# Patient Record
Sex: Female | Born: 1965 | Race: White | Hispanic: No | Marital: Married | Smoking: Never smoker
Health system: Southern US, Community
[De-identification: ages and names within clinical notes are randomized; demographics above are authoritative.]

## PROBLEM LIST (undated history)

## (undated) DIAGNOSIS — E669 Obesity, unspecified: Secondary | ICD-10-CM

## (undated) DIAGNOSIS — I10 Essential (primary) hypertension: Secondary | ICD-10-CM

## (undated) DIAGNOSIS — R7303 Prediabetes: Secondary | ICD-10-CM

## (undated) DIAGNOSIS — E7800 Pure hypercholesterolemia, unspecified: Secondary | ICD-10-CM

## (undated) HISTORY — DX: Pure hypercholesterolemia, unspecified: E78.00

## (undated) HISTORY — DX: Obesity, unspecified: E66.9

## (undated) HISTORY — PX: MOHS SURGERY: SUR867

## (undated) HISTORY — DX: Prediabetes: R73.03

## (undated) HISTORY — DX: Essential (primary) hypertension: I10

---

## 2013-06-04 ENCOUNTER — Ambulatory Visit: Payer: Self-pay | Admitting: Family Medicine

## 2013-06-04 LAB — LIPID PANEL
Cholesterol: 199 mg/dL (ref 0–200)
HDL: 60 mg/dL (ref 35–70)
LDL Cholesterol: 119 mg/dL
LDl/HDL Ratio: 2
TRIGLYCERIDES: 101 mg/dL (ref 40–160)

## 2013-06-04 LAB — BASIC METABOLIC PANEL
BUN: 18 mg/dL (ref 4–21)
Creatinine: 0.8 mg/dL (ref <=1.1)
Glucose: 96 mg/dL
Potassium: 3.9 mmol/L (ref 3.4–5.3)
SODIUM: 139 mmol/L (ref 137–147)

## 2013-06-04 LAB — HEPATIC FUNCTION PANEL
ALT: 17 U/L (ref 7–35)
AST: 14 U/L (ref 13–35)
Alkaline Phosphatase: 75 U/L (ref 25–125)
Bilirubin, Total: 0.7 mg/dL

## 2013-06-04 LAB — CBC AND DIFFERENTIAL
HEMATOCRIT: 40 % (ref 36–46)
Hemoglobin: 13.7 g/dL (ref 12.0–16.0)
Neutrophils Absolute: 71 /uL
Platelets: 319 10*3/uL (ref 150–399)
WBC: 10.1 10*3/mL

## 2013-06-04 LAB — TSH: TSH: 2.23 u[IU]/mL (ref <=5.90)

## 2014-08-10 ENCOUNTER — Other Ambulatory Visit: Payer: Self-pay

## 2014-08-10 NOTE — Telephone Encounter (Signed)
Fax from pharmacy-[facility]OV was 02/2014

## 2014-08-11 MED ORDER — HYDROCHLOROTHIAZIDE 25 MG PO TABS: 25.0000 mg | ORAL_TABLET | Freq: Every day | ORAL | Status: DC

## 2014-08-28 DIAGNOSIS — I10 Essential (primary) hypertension: Secondary | ICD-10-CM | POA: Insufficient documentation

## 2014-08-28 DIAGNOSIS — G4730 Sleep apnea, unspecified: Secondary | ICD-10-CM | POA: Insufficient documentation

## 2014-08-28 DIAGNOSIS — K219 Gastro-esophageal reflux disease without esophagitis: Secondary | ICD-10-CM | POA: Insufficient documentation

## 2014-08-28 DIAGNOSIS — E669 Obesity, unspecified: Secondary | ICD-10-CM | POA: Insufficient documentation

## 2014-09-20 ENCOUNTER — Encounter: Payer: Self-pay | Admitting: Family Medicine

## 2014-09-20 ENCOUNTER — Ambulatory Visit (INDEPENDENT_AMBULATORY_CARE_PROVIDER_SITE_OTHER): Payer: 59 | Admitting: Family Medicine

## 2014-09-20 VITALS — BP 122/80 | HR 72 | Temp 98.0°F | Resp 16 | Ht 65.0 in | Wt 231.0 lb

## 2014-09-20 DIAGNOSIS — K219 Gastro-esophageal reflux disease without esophagitis: Secondary | ICD-10-CM

## 2014-09-20 DIAGNOSIS — Z0000 Encounter for general adult medical examination without abnormal findings: Secondary | ICD-10-CM | POA: Diagnosis not present

## 2014-09-20 DIAGNOSIS — E669 Obesity, unspecified: Secondary | ICD-10-CM | POA: Diagnosis not present

## 2014-09-20 DIAGNOSIS — Z23 Encounter for immunization: Secondary | ICD-10-CM

## 2014-09-20 DIAGNOSIS — Z01419 Encounter for gynecological examination (general) (routine) without abnormal findings: Secondary | ICD-10-CM | POA: Diagnosis not present

## 2014-09-20 MED ORDER — OMEPRAZOLE 20 MG PO CPDR: 20.0000 mg | DELAYED_RELEASE_CAPSULE | Freq: Every day | ORAL | Status: DC

## 2014-09-20 NOTE — Progress Notes (Signed)
Patient ID: Mackenzie Newman, female   DOB: April 11, 1965, 49 y.o.   MRN: [id number]       [name], Female    DOB: 05-31-1965, 49 y.o.   MRN: [id number] Visit Date: 09/20/2014  Today's Provider: Megan Mans, MD   Chief Complaint  Patient presents with  . Annual Exam   Subjective:    Annual physical exam Mackenzie Newman is a 49 y.o. female who presents today for health maintenance and complete physical. She feels well. She reports exercising, walking about 3 days a week. She reports she is sleeping well. She reports that she would like a referral to a OB, as she would rather have her female exams done there and by a female.  ----------------------------------------------------------------- Pt reports that she has a cough that has been going on for the last couple of weeks. He describes it a " sealy" cough. It is mainly at her job, so she is unsure if it is because of the air conditioning or something there.  Review of Systems  Constitutional: Negative.   HENT: Negative.   Eyes: Negative.   Respiratory: Positive for cough.   Cardiovascular: Negative.   Gastrointestinal: Negative.   Endocrine: Negative.   Genitourinary: Negative.   Musculoskeletal: Negative.   Skin: Negative.   Allergic/Immunologic: Negative.   Neurological: Negative.   Hematological: Negative.   Psychiatric/Behavioral: Negative.    mild heartburn/reflux  with tomato products--this improved by taking one of her husband's omeprazole.  Social History She  reports that she has never smoked. She does not have any smokeless tobacco history on file. She reports that she does not drink alcohol or use illicit drugs.  Patient Active Problem List   Diagnosis Date Noted  . Acid reflux 08/28/2014  . Essential (primary) hypertension 08/28/2014  . Adiposity 08/28/2014  . Apnea, sleep 08/28/2014    Past Surgical History  Procedure Laterality Date  . Cesarean section      Family History Her She  was adopted. Family history is unknown by patient.    No Known Allergies  Previous Medications   HYDROCHLOROTHIAZIDE (HYDRODIURIL) 25 MG TABLET    Take 1 tablet (25 mg total) by mouth daily.    Patient Care Team: Maple Hudson., MD as PCP - General (Family Medicine)     Objective:   Vitals: BP 122/80 mmHg  Pulse 72  Temp(Src) 98 F (36.7 C) (Oral)  Resp 16  Ht  (1.651 m)  Wt 231 lb (104.781 kg)  BMI 38.44 kg/m2   Physical Exam  Constitutional: She is oriented to person, place, and time. She appears well-developed and well-nourished.  Waist measured at 41 inches  HENT:  Head: Normocephalic and atraumatic.  Right Ear: External ear normal.  Left Ear: External ear normal.  Nose: Nose normal.  Mouth/Throat: Oropharynx is clear and moist.  Eyes: Conjunctivae and EOM are normal. Pupils are equal, round, and reactive to light.  Neck: Normal range of motion. Neck supple.  Cardiovascular: Normal rate, regular rhythm, normal heart sounds and intact distal pulses.   Pulmonary/Chest: Effort normal and breath sounds normal.  Abdominal: Soft. Bowel sounds are normal.  Genitourinary:  deferred to GYN, referral made.  Musculoskeletal: Normal range of motion.  Neurological: She is alert and oriented to person, place, and time. She has normal reflexes.  Skin: Skin is warm and dry.  Very fair skin. No lesions noted.  Psychiatric: She has a normal mood and affect. Her behavior is normal. Judgment and thought content  normal.     Depression Screen No flowsheet data found.    Assessment & Plan:     Routine Health Maintenance and Physical Exam --Refer to GYN for gynecologic care --Mammogram every year to 2 years --Colonoscopy at age 49 Exercise Activities and Dietary recommendations Goals    None       There is no immunization history on file for this patient.  Health Maintenance  Topic Date Due  . HIV Screening  01/01/1982  . PAP SMEAR  01/01/1985  .  TETANUS/TDAP  01/01/1986  . INFLUENZA VACCINE  10/04/2014  1. Annual physical exam  - CBC with Differential/Platelet - COMPLETE METABOLIC PANEL WITH GFR - Lipid Panel With LDL/HDL Ratio - TSH  2. Obesity BMI is 38.44. Pt working on diet and exercise to get her BMI down so her insurance will be cheaper. Goal of 29. Waist measured at 41 inches instructed pt that her goal is 35.  Patient is shaped as opposed to apple shape so I think her BMI is not an accurate measure of her what obesity. I think a waist size of 35 or 34 will still leave her with a BMI of 30. I will be pleased if she can get to this level. He is working hard on this.   3. Visit for routine gyn exam  - Ambulatory referral to Gynecology  4. Need for Tdap vaccination  - Tdap vaccine greater than or equal to 7yo IM  5. Gastroesophageal reflux disease, esophagitis presence not specified  - omeprazole (PRILOSEC) 20 MG capsule; Take 1 capsule (20 mg total) by mouth daily.  Dispense: 90 capsule; Refill: 3     Discussed health benefits of physical activity, and encouraged her to engage in regular exercise appropriate for her age and condition.    --------------------------------------------------------------------

## 2014-09-24 LAB — CBC WITH DIFFERENTIAL/PLATELET
Basophils Absolute: 0 10*3/uL (ref 0.0–0.2)
Basos: 0 %
EOS (ABSOLUTE): 0.1 10*3/uL (ref 0.0–0.4)
EOS: 2 %
HEMOGLOBIN: 13.7 g/dL (ref 11.1–15.9)
Hematocrit: 40 % (ref 34.0–46.6)
IMMATURE GRANULOCYTES: 0 %
Immature Grans (Abs): 0 10*3/uL (ref 0.0–0.1)
Lymphocytes Absolute: 2.5 10*3/uL (ref 0.7–3.1)
Lymphs: 26 %
MCH: 28.4 pg (ref 26.6–33.0)
MCHC: 34.3 g/dL (ref 31.5–35.7)
MCV: 83 fL (ref 79–97)
Monocytes Absolute: 0.6 10*3/uL (ref 0.1–0.9)
Monocytes: 6 %
Neutrophils Absolute: 6.2 10*3/uL (ref 1.4–7.0)
Neutrophils: 66 %
Platelets: 331 10*3/uL (ref 150–379)
RBC: 4.82 x10E6/uL (ref 3.77–5.28)
RDW: 13 % (ref 12.3–15.4)
WBC: 9.5 10*3/uL (ref 3.4–10.8)

## 2014-09-25 LAB — COMPREHENSIVE METABOLIC PANEL
ALBUMIN: 4.1 g/dL (ref 3.5–5.5)
ALT: 18 IU/L (ref 0–32)
AST: 15 IU/L (ref 0–40)
Albumin/Globulin Ratio: 1.3 (ref 1.1–2.5)
Alkaline Phosphatase: 75 IU/L (ref 39–117)
BUN/Creatinine Ratio: 15 (ref 9–23)
BUN: 15 mg/dL (ref 6–24)
Bilirubin Total: 0.5 mg/dL (ref 0.0–1.2)
CHLORIDE: 98 mmol/L (ref 97–108)
CO2: 24 mmol/L (ref 18–29)
CREATININE: 1.03 mg/dL — AB (ref 0.57–1.00)
Calcium: 9.5 mg/dL (ref 8.7–10.2)
GFR calc Af Amer: 75 mL/min/{1.73_m2} (ref >59)
GFR calc non Af Amer: 65 mL/min/{1.73_m2} (ref >59)
GLOBULIN, TOTAL: 3.2 g/dL (ref 1.5–4.5)
GLUCOSE: 96 mg/dL (ref 65–99)
Potassium: 3.8 mmol/L (ref 3.5–5.2)
Sodium: 139 mmol/L (ref 134–144)
Total Protein: 7.3 g/dL (ref 6.0–8.5)

## 2014-09-25 LAB — LIPID PANEL WITH LDL/HDL RATIO
CHOLESTEROL TOTAL: 215 mg/dL — AB (ref 100–199)
HDL: 64 mg/dL (ref >39)
LDL CALC: 134 mg/dL — AB (ref 0–99)
LDL/HDL RATIO: 2.1 ratio (ref 0.0–3.2)
Triglycerides: 87 mg/dL (ref 0–149)
VLDL Cholesterol Cal: 17 mg/dL (ref 5–40)

## 2014-09-25 LAB — TSH: TSH: 2.38 u[IU]/mL (ref 0.450–4.500)

## 2014-09-27 NOTE — Progress Notes (Signed)
LMTCB  aa 

## 2014-09-29 ENCOUNTER — Other Ambulatory Visit: Payer: Self-pay | Admitting: Family Medicine

## 2014-10-27 ENCOUNTER — Encounter: Payer: 59 | Admitting: Obstetrics and Gynecology

## 2014-11-10 ENCOUNTER — Ambulatory Visit (INDEPENDENT_AMBULATORY_CARE_PROVIDER_SITE_OTHER): Payer: 59 | Admitting: Obstetrics and Gynecology

## 2014-11-10 ENCOUNTER — Encounter: Payer: Self-pay | Admitting: Obstetrics and Gynecology

## 2014-11-10 VITALS — BP 140/78 | HR 91 | Ht 65.0 in | Wt 233.2 lb

## 2014-11-10 DIAGNOSIS — Z01419 Encounter for gynecological examination (general) (routine) without abnormal findings: Secondary | ICD-10-CM

## 2014-11-11 NOTE — Progress Notes (Signed)
Subjective:    Mackenzie Newman is a 49 y.o. G36P1001 female who presents for an annual exam. The patient has no complaints today. The patient is sexually active. Denies painful intercourse or sexual problems. GYN screening history: last pap: approximate date 2013 and was normal and last mammogram: approximate date 2013 and was normal (performed in [place]). The patient wears seatbelts: yes. The patient participates in regular exercise: no. Has the patient ever been transfused or tattooed?: no. The patient reports that there is not domestic violence in her life.   Menstrual History: OB History    Gravida Para Term Preterm AB TAB SAB Ectopic Multiple Living   Menarche age: 60 Patient's last menstrual period was 10/20/2014.  Notes regular occuring menses, normal flow, denies dysmenorrhea.  Denies h/o abnormal pap smears or STIs.     Past Medical History  Diagnosis Date  . Obesity     Past Surgical History  Procedure Laterality Date  . Cesarean section     Family History  Problem Relation Age of Onset  . Adopted: Yes  . Family history unknown: Yes    Social History   Social History  . Marital Status: Married    Spouse Name: N/A  . Number of Children: N/A  . Years of Education: N/A   Occupational History  . Not on file.   Social History Main Topics  . Smoking status: Never Smoker   . Smokeless tobacco: Not on file  . Alcohol Use: No  . Drug Use: No  . Sexual Activity: Yes   Other Topics Concern  . Not on file   Social History Narrative    Current Outpatient Prescriptions on File Prior to Visit  Medication Sig Dispense Refill  . hydrochlorothiazide (HYDRODIURIL) 25 MG tablet Take 1 tablet by mouth  daily 90 tablet 3  . omeprazole (PRILOSEC) 20 MG capsule Take 1 capsule (20 mg total) by mouth daily. 90 capsule 3    No Known Allergies   Review of Systems Constitutional: negative for chills, fatigue, fevers and sweats Eyes: negative for  irritation, redness and visual disturbance Ears, nose, mouth, throat, and face: negative for hearing loss, nasal congestion, snoring and tinnitus Respiratory: negative for asthma, cough, sputum Cardiovascular: negative for chest pain, dyspnea, exertional chest pressure/discomfort, irregular heart beat, palpitations and syncope Gastrointestinal: negative for abdominal pain, change in bowel habits, nausea and vomiting Genitourinary: negative for abnormal menstrual periods, genital lesions, sexual problems and vaginal discharge, dysuria and urinary incontinence Integument/breast: negative for breast lump, breast tenderness and nipple discharge Hematologic/lymphatic: negative for bleeding and easy bruising Musculoskeletal:negative for back pain and muscle weakness Neurological: negative for dizziness, headaches, vertigo and weakness Endocrine: negative for diabetic symptoms including polydipsia, polyuria and skin dryness Allergic/Immunologic: negative for hay fever and urticaria     Objective:    BP 140/78 mmHg  Pulse 91  Ht  (1.651 m)  Wt 233 lb 3.2 oz (105.779 kg)  BMI 38.81 kg/m2  LMP 10/20/2014  General Appearance:    Alert, cooperative, no distress, appears stated age  Head:    Normocephalic, without obvious abnormality, atraumatic  Eyes:    PERRL, conjunctiva/corneas clear, EOM's intact, both eyes  Ears:    Normal TM's and external ear canals, both ears  Nose:   Nares normal, septum midline, mucosa normal, no drainage or sinus tenderness  Throat:   Lips, mucosa, and tongue normal; teeth and gums normal  Neck:   Supple, symmetrical, trachea midline, no adenopathy;    thyroid:  no enlargement/tenderness/nodules; no carotid bruit or JVD  Back:     Symmetric, no curvature, ROM normal, no CVA tenderness  Lungs:     Clear to auscultation bilaterally, respirations unlabored  Chest Wall:    No tenderness or deformity   Heart:    Regular rate and rhythm, S1 and S2 normal, no murmur,  rub or gallop  Breast Exam:    No tenderness, masses, or nipple abnormality  Abdomen:     Soft, non-tender, bowel sounds active all four quadrants,    no masses, no organomegaly  Genitalia:    Normal female without lesion, discharge or tenderness  Rectal:    Normal external sphincter, no masses or tenderness;   Internal exam not performed  Extremities:   Extremities normal, atraumatic, no cyanosis or edema  Pulses:   2+ and symmetric all extremities  Skin:   Skin color, texture, turgor normal, no rashes or lesions  Lymph nodes:   Cervical, supraclavicular, and axillary nodes normal  Neurologic:   CNII-XII intact, normal strength, sensation and reflexes throughout     Labs:  Office Visit on 09/20/2014  Component Date Value Ref Range Status  . WBC 09/24/2014 9.5  3.4 - 10.8 x10E3/uL Final  . RBC 09/24/2014 4.82  3.77 - 5.28 x10E6/uL Final  . Hemoglobin 09/24/2014 13.7  11.1 - 15.9 g/dL Final  . Hematocrit 21/30/8657 40.0  34.0 - 46.6 % Final  . MCV 09/24/2014 83  79 - 97 fL Final  . MCH 09/24/2014 28.4  26.6 - 33.0 pg Final  . MCHC 09/24/2014 34.3  31.5 - 35.7 g/dL Final  . RDW 84/69/6295 13.0  12.3 - 15.4 % Final  . Platelets 09/24/2014 331  150 - 379 x10E3/uL Final  . Neutrophils 09/24/2014 66   Final  . Lymphs 09/24/2014 26   Final  . Monocytes 09/24/2014 6   Final  . Eos 09/24/2014 2   Final  . Basos 09/24/2014 0   Final  . Neutrophils Absolute 09/24/2014 6.2  1.4 - 7.0 x10E3/uL Final  . Lymphocytes Absolute 09/24/2014 2.5  0.7 - 3.1 x10E3/uL Final  . Monocytes Absolute 09/24/2014 0.6  0.1 - 0.9 x10E3/uL Final  . EOS (ABSOLUTE) 09/24/2014 0.1  0.0 - 0.4 x10E3/uL Final  . Basophils Absolute 09/24/2014 0.0  0.0 - 0.2 x10E3/uL Final  . Immature Granulocytes 09/24/2014 0   Final  . Immature Grans (Abs) 09/24/2014 0.0  0.0 - 0.1 x10E3/uL Final  . Cholesterol, Total 09/24/2014 215* 100 - 199 mg/dL Final  . Triglycerides 09/24/2014 87  0 - 149 mg/dL Final  . HDL 28/41/3244 64  >39  mg/dL Final   Comment: According to ATP-III Guidelines, HDL-C >59 mg/dL is considered a negative risk factor for CHD.   [name] VLDL Cholesterol Cal 09/24/2014 17  5 - 40 mg/dL Final  . LDL Calculated 09/24/2014 010* 0 - 99 mg/dL Final  . LDl/HDL Ratio 09/24/2014 2.1  0.0 - 3.2 ratio units Final   Comment:                                     LDL/HDL Ratio  Men  Women                               1/2 Avg.Risk  1.0    1.5                                   Avg.Risk  3.6    3.2                                2X Avg.Risk  6.2    5.0                                3X Avg.Risk  8.0    6.1   . TSH 09/24/2014 2.380  0.450 - 4.500 uIU/mL Final  . Glucose 09/24/2014 96  65 - 99 mg/dL Final  . BUN 16/12/9602 15  6 - 24 mg/dL Final  . Creatinine, Ser 09/24/2014 1.03* 0.57 - 1.00 mg/dL Final  . GFR calc non Af Amer 09/24/2014 65  >59 mL/min/1.73 Final  . GFR calc Af Amer 09/24/2014 75  >59 mL/min/1.73 Final  . BUN/Creatinine Ratio 09/24/2014 15  9 - 23 Final  . Sodium 09/24/2014 139  134 - 144 mmol/L Final  . Potassium 09/24/2014 3.8  3.5 - 5.2 mmol/L Final  . Chloride 09/24/2014 98  97 - 108 mmol/L Final  . CO2 09/24/2014 24  18 - 29 mmol/L Final  . Calcium 09/24/2014 9.5  8.7 - 10.2 mg/dL Final  . Total Protein 09/24/2014 7.3  6.0 - 8.5 g/dL Final  . Albumin 54/11/8117 4.1  3.5 - 5.5 g/dL Final  . Globulin, Total 09/24/2014 3.2  1.5 - 4.5 g/dL Final  . Albumin/Globulin Ratio 09/24/2014 1.3  1.1 - 2.5 Final  . Bilirubin Total 09/24/2014 0.5  0.0 - 1.2 mg/dL Final  . Alkaline Phosphatase 09/24/2014 75  39 - 117 IU/L Final  . AST 09/24/2014 15  0 - 40 IU/L Final  . ALT 09/24/2014 18  0 - 32 IU/L Final     Assessment:    Healthy female exam.   Obesity Class II   Plan:     Breast self exam technique reviewed and patient encouraged to perform self-exam monthly. Contraception: none.  Discussed options, patient declines.  Discussed healthy lifestyle  modifications. Mammogram. Pap smear.   Declines STI testing.  RTC in 1 year for annual exam.   Hildred Laser, MD [facility]

## 2014-11-16 ENCOUNTER — Telehealth: Payer: Self-pay

## 2014-11-16 LAB — PAP IG AND HPV HIGH-RISK: PAP Smear Comment: 0

## 2014-11-16 NOTE — Telephone Encounter (Signed)
Called pt, informed her of negative PAP.

## 2014-11-16 NOTE — Telephone Encounter (Signed)
-----   Message from Hildred Laser, MD sent at 11/15/2014 11:07 AM EDT ----- Can send letter regarding normal pap smear.

## 2014-11-22 ENCOUNTER — Telehealth: Payer: Self-pay

## 2014-11-22 NOTE — Telephone Encounter (Signed)
-----   Message from Hildred Laser, MD sent at 11/22/2014  7:35 AM EDT ----- Eunice Blase, the HPV component of her pap was not reported, but was ordered.  Can we contact [facility] regarding this?

## 2014-11-23 NOTE — Telephone Encounter (Signed)
No, that's fine.  No need to call patient.  I was reviewing it in my box, and it didn't show.  Thanks.

## 2015-03-23 ENCOUNTER — Ambulatory Visit (INDEPENDENT_AMBULATORY_CARE_PROVIDER_SITE_OTHER): Payer: Managed Care, Other (non HMO) | Admitting: Family Medicine

## 2015-03-23 ENCOUNTER — Encounter: Payer: Self-pay | Admitting: Family Medicine

## 2015-03-23 DIAGNOSIS — M7052 Other bursitis of knee, left knee: Secondary | ICD-10-CM | POA: Diagnosis not present

## 2015-03-23 DIAGNOSIS — K219 Gastro-esophageal reflux disease without esophagitis: Secondary | ICD-10-CM | POA: Diagnosis not present

## 2015-03-23 DIAGNOSIS — G4700 Insomnia, unspecified: Secondary | ICD-10-CM

## 2015-03-23 DIAGNOSIS — I10 Essential (primary) hypertension: Secondary | ICD-10-CM

## 2015-03-23 MED ORDER — NAPROXEN 500 MG PO TABS: 500.0000 mg | ORAL_TABLET | Freq: Two times a day (BID) | ORAL | Status: DC | PRN

## 2015-03-23 MED ORDER — ZOLPIDEM TARTRATE 10 MG PO TABS: 10.0000 mg | ORAL_TABLET | Freq: Every evening | ORAL | Status: DC | PRN

## 2015-03-23 NOTE — Progress Notes (Signed)
Patient ID: Mackenzie Newman, female   DOB: [birth year], 50 y.o.   MRN: [id number]    Subjective:  Hypertension This is a chronic problem. The current episode started more than 1 year ago. The problem is controlled. Treatments tried: hydrochlorothiazide 25 mg. There are no compliance problems.   Gastroesophageal Reflux She complains of heartburn (pt reports symptoms occur when she eats tomatoe based, buttery and cream based foods). This is a chronic problem. The current episode started more than 1 year ago. The problem has been gradually improving. The symptoms are aggravated by certain foods. Risk factors include obesity. She has tried an antacid (Omeprazole 20 mg) for the symptoms.   Obesity:  Patient working on diet and exercise. Goal of 29 inch waist measurement.  Patient reports exercising 4 times a week and attending boot camp 3 times a week.   Prior to Admission medications   Medication Sig Start Date End Date Taking? Authorizing Provider  hydrochlorothiazide (HYDRODIURIL) 25 MG tablet Take 1 tablet by mouth  daily 09/29/14  Yes Naomii Kreger Hulen Shouts., MD  omeprazole (PRILOSEC) 20 MG capsule Take 1 capsule (20 mg total) by mouth daily. 09/20/14  Yes Zacheriah Stumpe Hulen Shouts., MD    Patient Active Problem List   Diagnosis Date Noted  . Acid reflux 08/28/2014  . Essential (primary) hypertension 08/28/2014  . Adiposity 08/28/2014  . Apnea, sleep 08/28/2014    Past Medical History  Diagnosis Date  . Obesity     Social History   Social History  . Marital Status: Married    Spouse Name: N/A  . Number of Children: N/A  . Years of Education: N/A   Occupational History  . Not on file.   Social History Main Topics  . Smoking status: Never Smoker   . Smokeless tobacco: Not on file  . Alcohol Use: No  . Drug Use: No  . Sexual Activity: Yes   Other Topics Concern  . Not on file   Social History Narrative    No Known Allergies  Review of Systems  Constitutional: Negative.     HENT: Negative.   Eyes: Negative.   Respiratory: Negative.   Cardiovascular: Negative.   Gastrointestinal: Positive for heartburn (pt reports symptoms occur when she eats tomatoe based, buttery and cream based foods).  Genitourinary: Negative.   Musculoskeletal: Negative.   Skin: Negative.   Neurological: Negative.   Endo/Heme/Allergies: Negative.   Psychiatric/Behavioral: Negative.     Immunization History  Administered Date(s) Administered  . Tdap 09/20/2014   Objective:  There were no vitals taken for this visit.  Physical Exam  Constitutional: She is oriented to person, place, and time and well-developed, well-nourished, and in no distress.  HENT:  Head: Normocephalic and atraumatic.  Right Ear: External ear normal.  Left Ear: External ear normal.  Mouth/Throat: Oropharynx is clear and moist.  Eyes: Conjunctivae and EOM are normal. Pupils are equal, round, and reactive to light.  Neck: Normal range of motion. Neck supple.  Cardiovascular: Normal rate, regular rhythm, normal heart sounds and intact distal pulses.   Pulmonary/Chest: Effort normal and breath sounds normal.  Abdominal: Soft. Bowel sounds are normal.  Musculoskeletal: She exhibits tenderness.  Tender along medial joint line- left knee  Neurological: She is alert and oriented to person, place, and time. She has normal reflexes. Gait normal. GCS score is 15.  No laxity of the knee noted.  Skin: Skin is warm and dry.  Psychiatric: Mood, memory, affect and judgment normal.  Lab Results  Component Value Date   WBC 9.5 09/24/2014   HGB 13.7 06/04/2013   HCT 40.0 09/24/2014   PLT 331 09/24/2014   GLUCOSE 96 09/24/2014   CHOL 215* 09/24/2014   TRIG 87 09/24/2014   HDL 64 09/24/2014   LDLCALC 134* 09/24/2014   TSH 2.380 09/24/2014    CMP     Component Value Date/Time   NA 139 09/24/2014 1101   K 3.8 09/24/2014 1101   CL 98 09/24/2014 1101   CO2 24 09/24/2014 1101   GLUCOSE 96 09/24/2014 1101    BUN 15 09/24/2014 1101   CREATININE 1.03* 09/24/2014 1101   CREATININE 0.8 06/04/2013   CALCIUM 9.5 09/24/2014 1101   PROT 7.3 09/24/2014 1101   ALBUMIN 4.1 09/24/2014 1101   AST 15 09/24/2014 1101   ALT 18 09/24/2014 1101   ALKPHOS 75 09/24/2014 1101   BILITOT 0.5 09/24/2014 1101   GFRNONAA 65 09/24/2014 1101   GFRAA 75 09/24/2014 1101    Assessment and Plan :  1. Bursitis of knee, left Prescribed Naproxen. If pain persist call back for a referral. May stop Naproxen if pain subsides. Referred at [facility] orthopedics/Dr. Martha Clan of this persists.  2. Gastroesophageal reflux disease, esophagitis presence not specified Stable. Continue Omeprazole 20 mg. Follow up 6 months  3. Essential (primary) hypertension Follow up 6 months. Continue HCTZ. May be able to discontinue medication if blood pressure continues to be stable.   4. Can not sleep Prescribed Zolpidem 10 mg PRN. Follow up 6 months.  5. Health maintenance Patient is actually started diet and exercise routine. She is starting to feel much better. She has also started to lose weight. I have done the exam and reviewed the above chart and it is accurate to the best of my knowledge.  Julieanne Manson MD [facility] 03/23/2015 4:35 PM

## 2015-03-24 ENCOUNTER — Ambulatory Visit: Payer: 59 | Admitting: Family Medicine

## 2015-06-25 ENCOUNTER — Ambulatory Visit (INDEPENDENT_AMBULATORY_CARE_PROVIDER_SITE_OTHER): Payer: Managed Care, Other (non HMO) | Admitting: Family Medicine

## 2015-06-25 ENCOUNTER — Encounter: Payer: Self-pay | Admitting: Family Medicine

## 2015-06-25 VITALS — BP 150/98 | HR 72 | Temp 98.1°F | Resp 16 | Wt 232.0 lb

## 2015-06-25 DIAGNOSIS — I10 Essential (primary) hypertension: Secondary | ICD-10-CM

## 2015-06-25 DIAGNOSIS — H6502 Acute serous otitis media, left ear: Secondary | ICD-10-CM | POA: Diagnosis not present

## 2015-06-25 DIAGNOSIS — K219 Gastro-esophageal reflux disease without esophagitis: Secondary | ICD-10-CM | POA: Diagnosis not present

## 2015-06-25 MED ORDER — OMEPRAZOLE 20 MG PO CPDR: 20.0000 mg | DELAYED_RELEASE_CAPSULE | Freq: Every day | ORAL | Status: DC

## 2015-06-25 MED ORDER — AMOXICILLIN 500 MG PO CAPS: 500.0000 mg | ORAL_CAPSULE | Freq: Three times a day (TID) | ORAL | Status: DC

## 2015-06-25 MED ORDER — HYDROCHLOROTHIAZIDE 25 MG PO TABS: ORAL_TABLET | ORAL | Status: DC

## 2015-06-25 MED ORDER — AZELASTINE-FLUTICASONE 137-50 MCG/ACT NA SUSP: 1.0000 | Freq: Two times a day (BID) | NASAL | Status: DC

## 2015-06-25 NOTE — Progress Notes (Signed)
Patient ID: Mackenzie Newman, female   DOB: 1965-07-29, 50 y.o.   MRN: [id number]       [name] Female    DOB: 1965-07-29   50 y.o.   MRN: [id number] Visit Date: 06/25/2015  Today's Provider: Dortha Kernennis Chrismon, PA   Chief Complaint  Patient presents with  . Otitis Media   Subjective:    HPI Ear Pain: Patient presents with left ear pain.  Symptoms include left ear pain. Symptoms began 1 week ago and are unchanged since that time. Patient denies nasal congestion and nonproductive cough. Ear history: a few previous ear infections several years ago. Patient reports that she has been having headache and neck stiffness also on the left side. Patient reports that she has been taking OTC Advil and reports mild relief for headache.   Past Medical History  Diagnosis Date  . Obesity    Patient Active Problem List   Diagnosis Date Noted  . Acid reflux 08/28/2014  . Essential (primary) hypertension 08/28/2014  . Adiposity 08/28/2014  . Apnea, sleep 08/28/2014   Past Surgical History  Procedure Laterality Date  . Cesarean section     Family History  Problem Relation Age of Onset  . Adopted: Yes  . Family history unknown: Yes   No Known Allergies Previous Medications   HYDROCHLOROTHIAZIDE (HYDRODIURIL) 25 MG TABLET    Take 1 tablet by mouth  daily   NAPROXEN (NAPROSYN) 500 MG TABLET    Take 1 tablet (500 mg total) by mouth 2 (two) times daily as needed.   OMEPRAZOLE (PRILOSEC) 20 MG CAPSULE    Take 1 capsule (20 mg total) by mouth daily.   ZOLPIDEM (AMBIEN) 10 MG TABLET    Take 1 tablet (10 mg total) by mouth at bedtime as needed for sleep.    Review of Systems  Constitutional: Negative.   HENT: Positive for ear pain.   Musculoskeletal: Positive for neck stiffness.  Neurological: Positive for headaches.    Social History  Substance Use Topics  . Smoking status: Never Smoker   . Smokeless tobacco: Not on file  . Alcohol Use: No   Objective:   BP 150/98 mmHg   Pulse 72  Temp(Src) 98.1 F (36.7 C) (Oral)  Resp 16  Wt 232 lb (105.235 kg)  SpO2 98%  LMP 06/20/2015 (Exact Date)  Physical Exam  Constitutional: She is oriented to person, place, and time. She appears well-developed and well-nourished. No distress.  HENT:  Head: Normocephalic and atraumatic.  Right Ear: Hearing and external ear normal.  Left Ear: Hearing normal.  Nose: Nose normal.  Left TM slightly retracted with fluid line. Not red and no perforation.  Eyes: Conjunctivae and lids are normal. Right eye exhibits no discharge. Left eye exhibits no discharge. No scleral icterus.  Neck: Neck supple.  Cardiovascular: Normal rate and regular rhythm.   Pulmonary/Chest: Effort normal and breath sounds normal. No respiratory distress.  Musculoskeletal: Normal range of motion.  Lymphadenopathy:    She has no cervical adenopathy.  Neurological: She is alert and oriented to person, place, and time.  Skin: Skin is intact. No lesion and no rash noted.  Psychiatric: She has a normal mood and affect. Her speech is normal and behavior is normal. Thought content normal.      Assessment & Plan:     1. Acute serous otitis media of left ear, recurrence not specified Onset over the past week. No drainage or perforation of TM. No fever today. Will treat with Dymista  nasal spray for suspected allergy component and Amoxil. Recheck if no better in 1 week. - Azelastine-Fluticasone (DYMISTA) 137-50 MCG/ACT SUSP; Place 1 spray into the nose 2 (two) times daily.  Dispense: 6 g; Refill: o - amoxicillin (AMOXIL) 500 MG capsule; Take 1 capsule (500 mg total) by mouth 3 (three) times daily.  Dispense: 30 capsule; Refill: 0  2. Gastroesophageal reflux disease, esophagitis presence not specified Stable and well controlled with regular use of Omeprazole daily. Refill prescription given. Had one refill left but changed insurance and could not obtain it without a new prescription.  - omeprazole (PRILOSEC) 20 MG  capsule; Take 1 capsule (20 mg total) by mouth daily.  Dispense: 90 capsule; Refill: 3  3. Essential (primary) hypertension Elevated BP with recent onset of earache. Unable to get refill of HCTZ due to change in insurance without a new prescription. Refill prescription given. - hydrochlorothiazide (HYDRODIURIL) 25 MG tablet; Take 1 tablet by mouth  daily  Dispense: 90 tablet; Refill: 3       Dortha Kern, PA  [facility]

## 2015-07-25 ENCOUNTER — Encounter: Payer: Self-pay | Admitting: Physician Assistant

## 2015-07-25 ENCOUNTER — Ambulatory Visit (INDEPENDENT_AMBULATORY_CARE_PROVIDER_SITE_OTHER): Payer: Managed Care, Other (non HMO) | Admitting: Physician Assistant

## 2015-07-25 VITALS — BP 150/92 | HR 83 | Temp 97.9°F | Resp 16 | Wt 233.6 lb

## 2015-07-25 DIAGNOSIS — J40 Bronchitis, not specified as acute or chronic: Secondary | ICD-10-CM | POA: Diagnosis not present

## 2015-07-25 MED ORDER — PREDNISONE 10 MG (21) PO TBPK: ORAL_TABLET | ORAL | Status: DC

## 2015-07-25 MED ORDER — AZITHROMYCIN 250 MG PO TABS: ORAL_TABLET | ORAL | Status: DC

## 2015-07-25 NOTE — Patient Instructions (Signed)

## 2015-07-25 NOTE — Progress Notes (Signed)
[name] Female    DOB: March 04, 1966   50 y.o.   MRN: [id number] Visit Date: 07/25/2015  Today's Provider: Margaretann LovelessJennifer M Rania Prothero, PA-C   Chief Complaint  Patient presents with  . Cough   Subjective:    Cough This is a new problem. The current episode started in the past 7 days (Since Thursday afternoon). The problem has been gradually improving. The problem occurs constantly. The cough is non-productive. Associated symptoms include nasal congestion, postnasal drip, rhinorrhea and wheezing. Pertinent negatives include no chest pain, chills, ear congestion, ear pain, fever, headaches, sore throat or shortness of breath. Nothing aggravates the symptoms. Treatments tried: Benadryl. The treatment provided mild relief.      No Known Allergies Previous Medications   HYDROCHLOROTHIAZIDE (HYDRODIURIL) 25 MG TABLET    Take 1 tablet by mouth  daily   NAPROXEN (NAPROSYN) 500 MG TABLET    Take 1 tablet (500 mg total) by mouth 2 (two) times daily as needed.   OMEPRAZOLE (PRILOSEC) 20 MG CAPSULE    Take 1 capsule (20 mg total) by mouth daily.   ZOLPIDEM (AMBIEN) 10 MG TABLET    Take 1 tablet (10 mg total) by mouth at bedtime as needed for sleep.    Review of Systems  Constitutional: Positive for fatigue. Negative for fever and chills.  HENT: Positive for congestion, postnasal drip, rhinorrhea and sneezing. Negative for ear pain, sinus pressure, sore throat, tinnitus and voice change.   Respiratory: Positive for cough and wheezing. Negative for chest tightness and shortness of breath.   Cardiovascular: Negative for chest pain, palpitations and leg swelling.  Gastrointestinal: Negative for nausea, vomiting and abdominal pain.  Neurological: Negative for dizziness and headaches.    Social History  Substance Use Topics  . Smoking status: Never Smoker   . Smokeless tobacco: Not on file  . Alcohol Use: No   Objective:   BP 150/92 mmHg  Pulse 83  Temp(Src) 97.9 F (36.6 C)  (Oral)  Resp 16  Wt 233 lb 9.6 oz (105.96 kg)  SpO2 97%  LMP 06/20/2015 (Exact Date)  Physical Exam  Constitutional: She appears well-developed and well-nourished. No distress.  HENT:  Head: Normocephalic and atraumatic.  Right Ear: Hearing, tympanic membrane, external ear and ear canal normal.  Left Ear: Hearing, tympanic membrane, external ear and ear canal normal.  Nose: Nose normal. Right sinus exhibits no maxillary sinus tenderness and no frontal sinus tenderness. Left sinus exhibits no maxillary sinus tenderness and no frontal sinus tenderness.  Mouth/Throat: Uvula is midline, oropharynx is clear and moist and mucous membranes are normal. No oropharyngeal exudate, posterior oropharyngeal edema or posterior oropharyngeal erythema.  Eyes: Conjunctivae are normal. Pupils are equal, round, and reactive to light. Right eye exhibits no discharge. Left eye exhibits no discharge. No scleral icterus.  Neck: Normal range of motion. Neck supple. No tracheal deviation present. No thyromegaly present.  Cardiovascular: Normal rate, regular rhythm and normal heart sounds.  Exam reveals no gallop and no friction rub.   No murmur heard. Pulmonary/Chest: Effort normal. No stridor. No respiratory distress. She has wheezes (mild expiratory throughout). She has no rales.  Lymphadenopathy:    She has no cervical adenopathy.  Skin: Skin is warm and dry. She is not diaphoretic.  Vitals reviewed.       Assessment & Plan:     1. Bronchitis Worsening symptoms over one week that has not responded to OTC medications. Will treat with zpak and prednisone taper  as below. She may continue allergy medication to prevent worsening symptoms. She needs to stay well hydrated and get plenty of rest. She is to call if symptoms fail to improve or worsen.   Discussed BP today and she had not taken her BP medication this morning because she forgot to.   - azithromycin (ZITHROMAX) 250 MG tablet; Take 2 tablets PO on day  one, and one tablet PO daily thereafter until completed.  Dispense: 6 tablet; Refill: 0 - predniSONE (STERAPRED UNI-PAK 21 TAB) 10 MG (21) TBPK tablet; Take as directed on package directions.  Dispense: 21 tablet; Refill: 0       Margaretann Loveless, PA-C  [facility]

## 2015-09-22 ENCOUNTER — Ambulatory Visit (INDEPENDENT_AMBULATORY_CARE_PROVIDER_SITE_OTHER): Payer: Managed Care, Other (non HMO) | Admitting: Family Medicine

## 2015-09-22 VITALS — BP 128/72 | HR 72 | Temp 98.2°F | Resp 14 | Wt 231.0 lb

## 2015-09-22 DIAGNOSIS — B356 Tinea cruris: Secondary | ICD-10-CM

## 2015-09-22 DIAGNOSIS — I10 Essential (primary) hypertension: Secondary | ICD-10-CM

## 2015-09-22 DIAGNOSIS — E669 Obesity, unspecified: Secondary | ICD-10-CM

## 2015-09-22 DIAGNOSIS — H6990 Unspecified Eustachian tube disorder, unspecified ear: Secondary | ICD-10-CM

## 2015-09-22 DIAGNOSIS — K219 Gastro-esophageal reflux disease without esophagitis: Secondary | ICD-10-CM

## 2015-09-22 DIAGNOSIS — H6980 Other specified disorders of Eustachian tube, unspecified ear: Secondary | ICD-10-CM

## 2015-09-22 DIAGNOSIS — G4730 Sleep apnea, unspecified: Secondary | ICD-10-CM

## 2015-09-22 MED ORDER — FLUTICASONE PROPIONATE 50 MCG/ACT NA SUSP: 2.0000 | Freq: Every day | NASAL | Status: DC

## 2015-09-22 MED ORDER — CLOTRIMAZOLE-BETAMETHASONE 1-0.05 % EX CREA: 1.0000 "application " | TOPICAL_CREAM | Freq: Two times a day (BID) | CUTANEOUS | Status: DC

## 2015-09-22 MED ORDER — HYDROCHLOROTHIAZIDE 25 MG PO TABS: ORAL_TABLET | ORAL | Status: DC

## 2015-09-22 MED ORDER — ZOLPIDEM TARTRATE 10 MG PO TABS: 10.0000 mg | ORAL_TABLET | Freq: Every evening | ORAL | Status: DC | PRN

## 2015-09-22 MED ORDER — OMEPRAZOLE 20 MG PO CPDR: 20.0000 mg | DELAYED_RELEASE_CAPSULE | Freq: Every day | ORAL | Status: DC

## 2015-09-22 NOTE — Progress Notes (Signed)
Subjective:  HPI  Patient is here for follow up  Hypertension: patient has not checked her b/p in a while. No cardiac symptoms. BP Readings from Last 3 Encounters:  09/22/15 128/72  07/25/15 150/92  06/25/15 150/98   GERD: patient takes Omeprazole 20 mg 1 tablet daily as needed. She takes this medication when she has to eat certain foods.  Insomnia: Patient takes Ambien about 2 times a month.  Patient would like 2 issues to be addressed: 1) left ear-sometimes wakes up and it is sore, feels like something is "swooshing" in there. 2) has had itching in the left groin area about 1 week now. No rash or blisters.  Prior to Admission medications   Medication Sig Start Date End Date Taking? Authorizing Provider  hydrochlorothiazide (HYDRODIURIL) 25 MG tablet Take 1 tablet by mouth  daily 06/25/15  Yes Dennis E Chrismon, PA  naproxen (NAPROSYN) 500 MG tablet Take 1 tablet (500 mg total) by mouth 2 (two) times daily as needed. 03/23/15  Yes Richard Hulen ShoutsL Gilbert Jr., MD  omeprazole (PRILOSEC) 20 MG capsule Take 1 capsule (20 mg total) by mouth daily. 06/25/15  Yes Dennis E Chrismon, PA  zolpidem (AMBIEN) 10 MG tablet Take 1 tablet (10 mg total) by mouth at bedtime as needed for sleep. 03/23/15 04/22/15  Maple Hudsonichard L Gilbert Jr., MD    Patient Active Problem List   Diagnosis Date Noted  . Acid reflux 08/28/2014  . Essential (primary) hypertension 08/28/2014  . Adiposity 08/28/2014  . Apnea, sleep 08/28/2014    Past Medical History  Diagnosis Date  . Obesity     Social History   Social History  . Marital Status: Married    Spouse Name: N/A  . Number of Children: N/A  . Years of Education: N/A   Occupational History  . Not on file.   Social History Main Topics  . Smoking status: Never Smoker   . Smokeless tobacco: Not on file  . Alcohol Use: No  . Drug Use: No  . Sexual Activity: Yes   Other Topics Concern  . Not on file   Social History Narrative    No Known  Allergies  Review of Systems  Constitutional: Negative.   HENT: Positive for ear pain.   Eyes: Negative.   Respiratory: Negative.   Cardiovascular: Negative.   Gastrointestinal: Negative.   Musculoskeletal: Negative.   Skin: Positive for itching. Negative for rash.  Neurological: Negative.   Endo/Heme/Allergies: Negative.   Psychiatric/Behavioral: Negative.     Immunization History  Administered Date(s) Administered  . Tdap 09/20/2014   Objective:  BP 128/72 mmHg  Pulse 72  Temp(Src) 98.2 F (36.8 C)  Resp 14  Wt 231 lb (104.781 kg)  Physical Exam  Constitutional: She is oriented to person, place, and time and well-developed, well-nourished, and in no distress.  Obese white female in no acute distress.  HENT:  Head: Normocephalic and atraumatic.  Right Ear: External ear normal.  Left Ear: External ear normal.  Nose: Nose normal.  Mouth/Throat: Oropharynx is clear and moist.  Left TM is full with fluid behind the TM.  Eyes: Conjunctivae and EOM are normal. Pupils are equal, round, and reactive to light. No scleral icterus.  Neck: Neck supple. No thyromegaly present.  Cardiovascular: Normal rate, regular rhythm and normal heart sounds.   Pulmonary/Chest: Effort normal and breath sounds normal.  Abdominal: Soft.  Lymphadenopathy:    She has no cervical adenopathy.  Neurological: She is alert and oriented to  person, place, and time. Gait normal.  Skin: Skin is warm and dry.  There is no obvious rash groin which is where she is states she has some itching  Psychiatric: Mood, memory, affect and judgment normal.    Lab Results  Component Value Date   WBC 9.5 09/24/2014   HGB 13.7 06/04/2013   HCT 40.0 09/24/2014   PLT 331 09/24/2014   GLUCOSE 96 09/24/2014   CHOL 215* 09/24/2014   TRIG 87 09/24/2014   HDL 64 09/24/2014   LDLCALC 134* 09/24/2014   TSH 2.380 09/24/2014    CMP     Component Value Date/Time   NA 139 09/24/2014 1101   K 3.8 09/24/2014 1101    CL 98 09/24/2014 1101   CO2 24 09/24/2014 1101   GLUCOSE 96 09/24/2014 1101   BUN 15 09/24/2014 1101   CREATININE 1.03* 09/24/2014 1101   CREATININE 0.8 06/04/2013   CALCIUM 9.5 09/24/2014 1101   PROT 7.3 09/24/2014 1101   ALBUMIN 4.1 09/24/2014 1101   AST 15 09/24/2014 1101   ALT 18 09/24/2014 1101   ALKPHOS 75 09/24/2014 1101   BILITOT 0.5 09/24/2014 1101   GFRNONAA 65 09/24/2014 1101   GFRAA 75 09/24/2014 1101    Assessment and Plan :  1. Tinea cruris This does not appear to be a yeast dermatitis. - clotrimazole-betamethasone (LOTRISONE) cream; Apply 1 application topically 2 (two) times daily.  Dispense: 30 g; Refill: 0  2. Essential (primary) hypertension  - hydrochlorothiazide (HYDRODIURIL) 25 MG tablet; Take 1 tablet by mouth  daily  Dispense: 90 tablet; Refill: 3  3. Gastroesophageal reflux disease, esophagitis presence not specified  - omeprazole (PRILOSEC) 20 MG capsule; Take 1 capsule (20 mg total) by mouth daily.  Dispense: 90 capsule; Refill: 3  4. Adiposity We'll stress dietary changes for this issue.  5. Apnea, sleep   6. Eustachian tube dysfunction, unspecified laterality/AR  - fluticasone (FLONASE) 50 MCG/ACT nasal spray; Place 2 sprays into both nostrils daily.  Dispense: 48 g; Refill: 3  I have done the exam and reviewed the above chart and it is accurate to the best of my knowledge.  Julieanne Manson MD [facility] 09/22/2015 4:33 PM

## 2016-04-04 ENCOUNTER — Ambulatory Visit (INDEPENDENT_AMBULATORY_CARE_PROVIDER_SITE_OTHER): Payer: Managed Care, Other (non HMO) | Admitting: Family Medicine

## 2016-04-04 VITALS — BP 132/92 | HR 72 | Resp 16 | Wt 229.0 lb

## 2016-04-04 DIAGNOSIS — E784 Other hyperlipidemia: Secondary | ICD-10-CM | POA: Diagnosis not present

## 2016-04-04 DIAGNOSIS — I10 Essential (primary) hypertension: Secondary | ICD-10-CM | POA: Diagnosis not present

## 2016-04-04 DIAGNOSIS — K219 Gastro-esophageal reflux disease without esophagitis: Secondary | ICD-10-CM

## 2016-04-04 DIAGNOSIS — G4709 Other insomnia: Secondary | ICD-10-CM

## 2016-04-04 DIAGNOSIS — E7849 Other hyperlipidemia: Secondary | ICD-10-CM

## 2016-04-04 DIAGNOSIS — B079 Viral wart, unspecified: Secondary | ICD-10-CM

## 2016-04-04 DIAGNOSIS — Z6838 Body mass index (BMI) 38.0-38.9, adult: Secondary | ICD-10-CM

## 2016-04-04 NOTE — Progress Notes (Signed)
Mackenzie Newman  MRN: [id number] DOB: 06-01-1965  Subjective:  HPI  Patient is here for follow up. Patient does not check her b/p. No cardiac symptoms present. BP Readings from Last 3 Encounters:  04/04/16 (!) 132/92  09/22/15 128/72  07/25/15 (!) 150/92   Takes Omeprazole as needed mainly when she eats something that will aggravate these symptoms, about 3 tablets a month. Takes Ambien about 2 times a month.  Last labs were done July 2016. Patient Active Problem List   Diagnosis Date Noted  . Acid reflux 08/28/2014  . Essential (primary) hypertension 08/28/2014  . Adiposity 08/28/2014  . Apnea, sleep 08/28/2014    Past Medical History:  Diagnosis Date  . Obesity     Social History   Social History  . Marital status: Married    Spouse name: N/A  . Number of children: N/A  . Years of education: N/A   Occupational History  . Not on file.   Social History Main Topics  . Smoking status: Never Smoker  . Smokeless tobacco: Not on file  . Alcohol use No  . Drug use: No  . Sexual activity: Yes   Other Topics Concern  . Not on file   Social History Narrative  . No narrative on file    Outpatient Encounter Prescriptions as of 04/04/2016  Medication Sig Note  . clotrimazole-betamethasone (LOTRISONE) cream Apply 1 application topically 2 (two) times daily.   . fluticasone (FLONASE) 50 MCG/ACT nasal spray Place 2 sprays into both nostrils daily.   . hydrochlorothiazide (HYDRODIURIL) 25 MG tablet Take 1 tablet by mouth  daily   . naproxen (NAPROSYN) 500 MG tablet Take 1 tablet (500 mg total) by mouth 2 (two) times daily as needed.   [name]. omeprazole (PRILOSEC) 20 MG capsule Take 1 capsule (20 mg total) by mouth daily. 04/04/2016: prn  . zolpidem (AMBIEN) 10 MG tablet Take 10 mg by mouth at bedtime as needed for sleep.   . [DISCONTINUED] zolpidem (AMBIEN) 10 MG tablet Take 1 tablet (10 mg total) by mouth at bedtime as needed for sleep.    No facility-administered  encounter medications on file as of 04/04/2016.     No Known Allergies  Review of Systems  Constitutional: Negative.   Respiratory: Negative.   Cardiovascular: Negative.   Gastrointestinal: Negative.   Musculoskeletal: Negative.   Neurological: Negative.     Objective:  BP (!) 132/92   Pulse 72   Resp 16   Wt 229 lb (103.9 kg)   BMI 38.11 kg/m   Physical Exam  Constitutional: She is oriented to person, place, and time and well-developed, well-nourished, and in no distress.  HENT:  Head: Normocephalic and atraumatic.  Eyes: Conjunctivae are normal. Pupils are equal, round, and reactive to light.  Neck: Normal range of motion. Neck supple.  Cardiovascular: Normal rate, regular rhythm, normal heart sounds and intact distal pulses.   No murmur heard. Pulmonary/Chest: Effort normal and breath sounds normal. No respiratory distress. She has no wheezes.  Musculoskeletal: She exhibits no edema or tenderness.  Neurological: She is alert and oriented to person, place, and time. Gait normal.  Psychiatric: Mood, memory, affect and judgment normal.   Assessment and Plan :  1. Essential (primary) hypertension Slightly elevated. Will follow for now. Check labs. - CBC w/Diff/Platelet - Comprehensive metabolic panel  2. Gastroesophageal reflux disease, esophagitis presence not specified Stable.  3. Other insomnia - TSH  4. Other hyperlipidemia - Comprehensive metabolic panel - Lipid Panel With LDL/HDL  Ratio  5. BMI 38.0-38.9,adult Encouraged patient to work on habits. Follow. - TSH  HPI, Exam and A&P transcribed under direction and in the presence of Julieanne Manson, MD. I have done the exam and reviewed the chart and it is accurate to the best of my knowledge. Dentist has been used and  any errors in dictation or transcription are unintentional. Julieanne Manson M.D. [facility]

## 2016-04-11 LAB — COMPREHENSIVE METABOLIC PANEL
ALBUMIN: 4.1 g/dL (ref 3.5–5.5)
ALT: 19 IU/L (ref 0–32)
AST: 21 IU/L (ref 0–40)
Albumin/Globulin Ratio: 1.4 (ref 1.2–2.2)
Alkaline Phosphatase: 80 IU/L (ref 39–117)
BUN / CREAT RATIO: 22 (ref 9–23)
BUN: 19 mg/dL (ref 6–24)
Bilirubin Total: 0.6 mg/dL (ref 0.0–1.2)
CO2: 20 mmol/L (ref 18–29)
CREATININE: 0.88 mg/dL (ref 0.57–1.00)
Calcium: 9.5 mg/dL (ref 8.7–10.2)
Chloride: 98 mmol/L (ref 96–106)
GFR, EST AFRICAN AMERICAN: 89 mL/min/{1.73_m2} (ref >59)
GFR, EST NON AFRICAN AMERICAN: 77 mL/min/{1.73_m2} (ref >59)
GLOBULIN, TOTAL: 3 g/dL (ref 1.5–4.5)
GLUCOSE: 112 mg/dL — AB (ref 65–99)
Potassium: 3.4 mmol/L — ABNORMAL LOW (ref 3.5–5.2)
Sodium: 140 mmol/L (ref 134–144)
TOTAL PROTEIN: 7.1 g/dL (ref 6.0–8.5)

## 2016-04-11 LAB — CBC WITH DIFFERENTIAL/PLATELET
BASOS ABS: 0 10*3/uL (ref 0.0–0.2)
Basos: 0 %
EOS (ABSOLUTE): 0.2 10*3/uL (ref 0.0–0.4)
EOS: 2 %
HEMATOCRIT: 39 % (ref 34.0–46.6)
HEMOGLOBIN: 13.3 g/dL (ref 11.1–15.9)
IMMATURE GRANS (ABS): 0 10*3/uL (ref 0.0–0.1)
Immature Granulocytes: 0 %
LYMPHS: 21 %
Lymphocytes Absolute: 2.2 10*3/uL (ref 0.7–3.1)
MCH: 28.2 pg (ref 26.6–33.0)
MCHC: 34.1 g/dL (ref 31.5–35.7)
MCV: 83 fL (ref 79–97)
Monocytes Absolute: 0.7 10*3/uL (ref 0.1–0.9)
Monocytes: 6 %
Neutrophils Absolute: 7.4 10*3/uL — ABNORMAL HIGH (ref 1.4–7.0)
Neutrophils: 71 %
Platelets: 339 10*3/uL (ref 150–379)
RBC: 4.72 x10E6/uL (ref 3.77–5.28)
RDW: 13.5 % (ref 12.3–15.4)
WBC: 10.4 10*3/uL (ref 3.4–10.8)

## 2016-04-11 LAB — LIPID PANEL WITH LDL/HDL RATIO
CHOLESTEROL TOTAL: 214 mg/dL — AB (ref 100–199)
HDL: 64 mg/dL (ref >39)
LDL CALC: 132 mg/dL — AB (ref 0–99)
LDL/HDL RATIO: 2.1 ratio (ref 0.0–3.2)
Triglycerides: 88 mg/dL (ref 0–149)
VLDL CHOLESTEROL CAL: 18 mg/dL (ref 5–40)

## 2016-04-11 LAB — TSH: TSH: 4.03 u[IU]/mL (ref 0.450–4.500)

## 2016-06-30 ENCOUNTER — Other Ambulatory Visit: Payer: Self-pay | Admitting: Family Medicine

## 2016-09-29 ENCOUNTER — Other Ambulatory Visit: Payer: Self-pay | Admitting: Family Medicine

## 2016-09-29 DIAGNOSIS — I10 Essential (primary) hypertension: Secondary | ICD-10-CM

## 2016-09-30 ENCOUNTER — Other Ambulatory Visit: Payer: Self-pay | Admitting: Family Medicine

## 2016-10-02 ENCOUNTER — Other Ambulatory Visit: Payer: Self-pay | Admitting: Family Medicine

## 2016-10-02 ENCOUNTER — Encounter (INDEPENDENT_AMBULATORY_CARE_PROVIDER_SITE_OTHER): Payer: Managed Care, Other (non HMO) | Admitting: Family Medicine

## 2016-10-02 DIAGNOSIS — B356 Tinea cruris: Secondary | ICD-10-CM

## 2016-10-17 ENCOUNTER — Ambulatory Visit: Payer: Managed Care, Other (non HMO) | Admitting: Family Medicine

## 2016-10-17 ENCOUNTER — Ambulatory Visit (INDEPENDENT_AMBULATORY_CARE_PROVIDER_SITE_OTHER): Payer: Managed Care, Other (non HMO) | Admitting: Family Medicine

## 2016-10-17 ENCOUNTER — Encounter: Payer: Self-pay | Admitting: Family Medicine

## 2016-10-17 VITALS — BP 156/94 | HR 80 | Temp 97.6°F | Resp 16 | Wt 240.0 lb

## 2016-10-17 DIAGNOSIS — B079 Viral wart, unspecified: Secondary | ICD-10-CM | POA: Insufficient documentation

## 2016-10-17 DIAGNOSIS — E669 Obesity, unspecified: Secondary | ICD-10-CM | POA: Diagnosis not present

## 2016-10-17 DIAGNOSIS — I10 Essential (primary) hypertension: Secondary | ICD-10-CM

## 2016-10-17 DIAGNOSIS — Z6839 Body mass index (BMI) 39.0-39.9, adult: Secondary | ICD-10-CM

## 2016-10-17 DIAGNOSIS — B078 Other viral warts: Secondary | ICD-10-CM | POA: Diagnosis not present

## 2016-10-17 DIAGNOSIS — F5101 Primary insomnia: Secondary | ICD-10-CM | POA: Diagnosis not present

## 2016-10-17 DIAGNOSIS — G4700 Insomnia, unspecified: Secondary | ICD-10-CM | POA: Insufficient documentation

## 2016-10-17 NOTE — Progress Notes (Signed)
[name] Female    DOB: 23-Apr-1965   51 y.o.   MRN: [id number] Visit Date: 10/17/2016  Today's Provider: Shirlee LatchAngela Janziel Hockett, MD   Chief Complaint  Patient presents with  . Hypertension  . Insomnia   Subjective:    HPI      Hypertension, follow-up:  BP Readings from Last 3 Encounters:  10/17/16 (!) 156/94  04/04/16 (!) 132/92  09/22/15 128/72    She was last seen for hypertension 6 months ago.  BP at that visit was 132/92. Management since that visit includes none; was going to follow BP. She reports excellent compliance with treatment. She is not having side effects.  She is exercising. 3 times per week. Boot camp. She is adherent to low salt diet.   Outside blood pressures are 120-130/80-85. Thinks that BP is elevated currently due to agitation and high sodium lunch.  Does not think this is reflective of her true control. She is experiencing none.  Patient denies chest pain, chest pressure/discomfort, claudication, dyspnea, exertional chest pressure/discomfort, fatigue, irregular heart beat, lower extremity edema, near-syncope, orthopnea, palpitations and syncope.   Cardiovascular risk factors include hypertension.  Use of agents associated with hypertension: none.     Weight trend: increasing steadily Wt Readings from Last 3 Encounters:  10/17/16 240 lb (108.9 kg)  04/04/16 229 lb (103.9 kg)  09/22/15 231 lb (104.8 kg)    Current diet: in general, a "healthy" diet    ------------------------------------------------------------------------  Follow up for Insomnia  The patient was last seen for this 6 months ago. Changes made at last visit include checking labs and continuing Ambien PRN (about once a month).  She reports good compliance with treatment. She feels that condition is Improved. She is not having side effects.   ------------------------------------------------------------------------------------   Warts on b/l finger tips -  freezing of one 6 months ago - also present on feet - have been present for ~1 yr - would like to have them frozen  No Known Allergies   Current Outpatient Prescriptions:  .  clotrimazole-betamethasone (LOTRISONE) cream, APPLY 1 APPLICATION TOPICALLY 2 (TWO) TIMES DAILY., Disp: 30 g, Rfl: 0 .  hydrochlorothiazide (HYDRODIURIL) 25 MG tablet, TAKE 1 TABLET BY MOUTH DAILY, Disp: 90 tablet, Rfl: 3 .  naproxen (NAPROSYN) 500 MG tablet, TAKE 1 TABLET (500 MG TOTAL) BY MOUTH 2 (TWO) TIMES DAILY AS NEEDED., Disp: 60 tablet, Rfl: 4 .  omeprazole (PRILOSEC) 20 MG capsule, Take 1 capsule (20 mg total) by mouth daily., Disp: 90 capsule, Rfl: 3 .  zolpidem (AMBIEN) 10 MG tablet, TAKE 1 TABLET BY MOUTH AT BEDTIME AS NEEDED FOR SLEEP., Disp: 90 tablet, Rfl: 1 .  fluticasone (FLONASE) 50 MCG/ACT nasal spray, Place 2 sprays into both nostrils daily. (Patient not taking: Reported on 10/17/2016), Disp: 48 g, Rfl: 3  Review of Systems  Constitutional: Negative.   HENT: Negative.   Respiratory: Negative.   Cardiovascular: Negative.   Gastrointestinal: Negative.   Genitourinary: Negative.   Musculoskeletal: Negative.     Social History  Substance Use Topics  . Smoking status: Never Smoker  . Smokeless tobacco: Never Used  . Alcohol use No   Objective:   BP (!) 156/94 (BP Location: Left Arm, Patient Position: Sitting, Cuff Size: Large)   Pulse 80   Temp 97.6 F (36.4 C) (Oral)   Resp 16   Wt 240 lb (108.9 kg)   LMP 09/06/2016   BMI 39.94 kg/m  Vitals:   10/17/16 1641  BP: [name](!)  156/94  Pulse: 80  Resp: 16  Temp: 97.6 F (36.4 C)  TempSrc: Oral  Weight: 240 lb (108.9 kg)     Physical Exam  Constitutional: She is oriented to person, place, and time. She appears well-developed and well-nourished. No distress.  HENT:  Head: Normocephalic and atraumatic.  Right Ear: External ear normal.  Left Ear: External ear normal.  Mouth/Throat: Oropharynx is clear and moist.  Eyes: Pupils are  equal, round, and reactive to light. Conjunctivae are normal. No scleral icterus.  Neck: Neck supple.  Cardiovascular: Normal rate, regular rhythm, normal heart sounds and intact distal pulses.   No murmur heard. Pulmonary/Chest: Effort normal and breath sounds normal. No respiratory distress. She has no wheezes. She has no rales.  Musculoskeletal: She exhibits no edema or deformity.  Palmar and plantar warts present on b/l hands and feet  Lymphadenopathy:    She has no cervical adenopathy.  Neurological: She is alert and oriented to person, place, and time.  Skin: Skin is warm and dry. No rash noted.  Psychiatric: She has a normal mood and affect. Her behavior is normal.  Vitals reviewed.      Assessment & Plan:         Essential (primary) hypertension Uncontrolled today Wonder if this is related to stress currently Patient wishes to be rechecked in 2-4 weeks at next visit before changing medications CMP today  Insomnia Stable Continue prn Ambien Discussed potential for dependence if used more frequently Does not need refill today  Adiposity Discussed diet and exercise  Palmar wart Plan for f/u visit for cryo procedure Advised patient that recurrent visit may be necessary to resolve warts    Shirlee Latch, MD  [facility]

## 2016-10-17 NOTE — Patient Instructions (Signed)
Plantar Warts Plantar warts are small growths on the bottom of the foot (sole). Warts are caused by a type of germ (virus). Most warts are not painful, and they usually do not cause problems. Sometimes, plantar warts can cause pain when you walk. Warts often go away on their own in time. Treatments may be done if needed. Follow these instructions at home: General instructions  Apply creams or solutions only as told by your doctor. Follow these steps if your doctor tells you to do so: ? Soak your foot in warm water. ? Remove the top layer of softened skin before you apply the medicine. You can use a pumice stone to remove the tissue. ? After you apply the medicine, put a bandage over the area of the wart. ? Repeat the process every day or as told by your doctor.  Do not scratch or pick at a wart.  Wash your hands after you touch a wart.  If a wart is painful, try putting a bandage with a hole in the middle over the wart.  Keep all follow-up visits as told by your doctor. This is important. Prevention  Wear shoes and socks. Change socks every day.  Keep your feet clean and dry.  Check your feet often.  Avoid direct contact with warts on other people. Contact a doctor if:  Your warts do not improve after treatment.  You have redness, swelling, or pain at the site of a wart.  You have bleeding from a wart, and the bleeding does not stop when you put light pressure on the wart.  You have diabetes and you get a wart. This information is not intended to replace advice given to you by your health care provider. Make sure you discuss any questions you have with your health care provider. Document Released: 03/24/2010 Document Revised: 07/28/2015 Document Reviewed: 05/17/2014 Elsevier Interactive Patient Education  2018 [employer]vier Inc.  

## 2016-10-18 NOTE — Assessment & Plan Note (Signed)
Uncontrolled today Wonder if this is related to stress currently Patient wishes to be rechecked in 2-4 weeks at next visit before changing medications CMP today

## 2016-10-18 NOTE — Assessment & Plan Note (Signed)
Plan for f/u visit for cryo procedure Advised patient that recurrent visit may be necessary to resolve warts

## 2016-10-18 NOTE — Assessment & Plan Note (Signed)
Stable Continue prn Ambien Discussed potential for dependence if used more frequently Does not need refill today

## 2016-10-18 NOTE — Assessment & Plan Note (Signed)
Discussed diet and exercise 

## 2016-10-22 ENCOUNTER — Telehealth: Payer: Self-pay

## 2016-10-22 NOTE — Telephone Encounter (Signed)
-----   Message from Erasmo Downer, MD sent at 10/22/2016  9:58 AM EDT ----- Can we check to see if patient has gotten labs or if she can go? Thanks!   ----- Message ----- From: SYSTEM Sent: 10/22/2016  12:07 AM To: Erasmo Downer, MD

## 2016-10-22 NOTE — Telephone Encounter (Signed)
Pt reports she was not able to get these labs done last week, and she is hoping to get these done this week.

## 2016-11-02 ENCOUNTER — Telehealth: Payer: Self-pay

## 2016-11-02 LAB — COMPREHENSIVE METABOLIC PANEL
ALT: 22 IU/L (ref 0–32)
AST: 24 IU/L (ref 0–40)
Albumin/Globulin Ratio: 1.4 (ref 1.2–2.2)
Albumin: 4.1 g/dL (ref 3.5–5.5)
Alkaline Phosphatase: 73 IU/L (ref 39–117)
BILIRUBIN TOTAL: 0.5 mg/dL (ref 0.0–1.2)
BUN/Creatinine Ratio: 20 (ref 9–23)
BUN: 21 mg/dL (ref 6–24)
CALCIUM: 9.7 mg/dL (ref 8.7–10.2)
CHLORIDE: 100 mmol/L (ref 96–106)
CO2: 25 mmol/L (ref 20–29)
Creatinine, Ser: 1.06 mg/dL — ABNORMAL HIGH (ref 0.57–1.00)
GFR calc non Af Amer: 62 mL/min/{1.73_m2} (ref >59)
GFR, EST AFRICAN AMERICAN: 71 mL/min/{1.73_m2} (ref >59)
GLUCOSE: 102 mg/dL — AB (ref 65–99)
Globulin, Total: 3 g/dL (ref 1.5–4.5)
Potassium: 3.8 mmol/L (ref 3.5–5.2)
Sodium: 139 mmol/L (ref 134–144)
TOTAL PROTEIN: 7.1 g/dL (ref 6.0–8.5)

## 2016-11-02 NOTE — Telephone Encounter (Signed)
-----   Message from Erasmo DownerAngela M Bacigalupo, MD sent at 11/02/2016  8:23 AM EDT ----- Labs are stable - kidney function, liver function, electrolytes.  Erasmo DownerBacigalupo, Angela M, MD, MPH [facility] 11/02/2016 8:23 AM

## 2016-11-02 NOTE — Telephone Encounter (Signed)
Pt advised and acknowledges understanding.  

## 2017-01-01 ENCOUNTER — Other Ambulatory Visit: Payer: Self-pay | Admitting: Family Medicine

## 2017-01-01 NOTE — Telephone Encounter (Signed)
[facility] faxed a request for a 90-days supply for the following medication. Thanks CC  naproxen (NAPROSYN) 500 MG tablet  Take 1 tablet ( 500 MG Total) by mouth 2 (two) time daily As needed.

## 2017-01-02 MED ORDER — NAPROXEN 500 MG PO TABS
500.0000 mg | ORAL_TABLET | Freq: Two times a day (BID) | ORAL | 5 refills | Status: DC | PRN
Start: 2017-01-02 — End: 2017-01-09

## 2017-01-09 ENCOUNTER — Telehealth: Payer: Self-pay | Admitting: Family Medicine

## 2017-01-09 MED ORDER — NAPROXEN 500 MG PO TABS: 500.0000 mg | ORAL_TABLET | Freq: Two times a day (BID) | ORAL | 5 refills | Status: DC | PRN

## 2017-01-09 NOTE — Telephone Encounter (Signed)
[facility] faxed a request for a 90-days supply for the following medication. Thanks CC ° °naproxen (NAPROSYN) 500 MG tablet  ° °

## 2017-03-20 ENCOUNTER — Other Ambulatory Visit: Payer: Self-pay | Admitting: Family Medicine

## 2017-03-20 DIAGNOSIS — I10 Essential (primary) hypertension: Secondary | ICD-10-CM

## 2017-03-20 NOTE — Telephone Encounter (Signed)
Please advise, LOV was in August 2018-Mackenzie Newman, RMA Medications pulled in meds and order

## 2017-03-20 NOTE — Telephone Encounter (Signed)
Patient is requesting the following medications  hydrochlorothiazide (HYDRODIURIL) 25 MG tablet, 90 day supply  naproxen (NAPROSYN) 500 MG tablet, 30 day supply  zolpidem (AMBIEN) 10 MG tablet, 90 day supply  [facility]

## 2017-03-21 MED ORDER — HYDROCHLOROTHIAZIDE 25 MG PO TABS: 25.0000 mg | ORAL_TABLET | Freq: Every day | ORAL | 0 refills | Status: DC

## 2017-03-21 MED ORDER — NAPROXEN 500 MG PO TABS: 500.0000 mg | ORAL_TABLET | Freq: Two times a day (BID) | ORAL | 0 refills | Status: DC | PRN

## 2017-03-21 MED ORDER — ZOLPIDEM TARTRATE 10 MG PO TABS: 10.0000 mg | ORAL_TABLET | Freq: Every evening | ORAL | 0 refills | Status: DC | PRN

## 2017-03-21 NOTE — Telephone Encounter (Signed)
Pt advised and FU made. 

## 2017-03-21 NOTE — Telephone Encounter (Signed)
Called in to [facility].

## 2017-03-21 NOTE — Telephone Encounter (Signed)
Please call in Ambien.  One month supply of each med given.  Will need f/u appt for further refills because needed BP rechecked.  Erasmo DownerBacigalupo, Angela M, MD, MPH [facility] 03/21/2017 1:57 PM

## 2017-03-21 NOTE — Telephone Encounter (Signed)
I think this lady now sees u and was supposed to f/u in fall. I will leave rf to your discretion.

## 2017-04-19 ENCOUNTER — Encounter: Payer: Self-pay | Admitting: Family Medicine

## 2017-04-19 ENCOUNTER — Ambulatory Visit: Payer: BLUE CROSS/BLUE SHIELD | Admitting: Family Medicine

## 2017-04-19 VITALS — BP 108/64 | HR 86 | Temp 97.7°F | Resp 16 | Wt 236.0 lb

## 2017-04-19 DIAGNOSIS — Z1231 Encounter for screening mammogram for malignant neoplasm of breast: Secondary | ICD-10-CM

## 2017-04-19 DIAGNOSIS — E669 Obesity, unspecified: Secondary | ICD-10-CM | POA: Diagnosis not present

## 2017-04-19 DIAGNOSIS — Z1211 Encounter for screening for malignant neoplasm of colon: Secondary | ICD-10-CM

## 2017-04-19 DIAGNOSIS — Z6839 Body mass index (BMI) 39.0-39.9, adult: Secondary | ICD-10-CM | POA: Diagnosis not present

## 2017-04-19 DIAGNOSIS — I10 Essential (primary) hypertension: Secondary | ICD-10-CM | POA: Diagnosis not present

## 2017-04-19 DIAGNOSIS — B078 Other viral warts: Secondary | ICD-10-CM | POA: Diagnosis not present

## 2017-04-19 MED ORDER — HYDROCHLOROTHIAZIDE 25 MG PO TABS: 25.0000 mg | ORAL_TABLET | Freq: Every day | ORAL | 1 refills | Status: DC

## 2017-04-19 NOTE — Assessment & Plan Note (Signed)
Discussed diet and exercise Congratulated her on 4lb weight loss

## 2017-04-19 NOTE — Patient Instructions (Signed)
Colorectal Cancer Screening Colorectal cancer screening is a group of tests used to check for colorectal cancer. Colorectal refers to your colon and rectum. Your colon and rectum are located at the end of your large intestine and carry your bowel movements out of your body. Why is colorectal cancer screening done? It is common for abnormal growths (polyps) to form in the lining of your colon, especially as you get older. These polyps can be cancerous or become cancerous. If colorectal cancer is found at an early stage, it is treatable. Who should be screened for colorectal cancer? Screening is recommended for all adults at average risk starting at age 50. Tests may be recommended every 1 to 10 years. Your health care provider may recommend earlier or more frequent screening if you have:  A history of colorectal cancer or polyps.  A family member with a history of colorectal cancer or polyps.  Inflammatory bowel disease, such as ulcerative colitis or Crohn disease.  A type of hereditary colon cancer syndrome.  Colorectal cancer symptoms.  Types of screening tests There are several types of colorectal screening tests. They include:  Guaiac-based fecal occult blood testing.  Fecal immunochemical test (FIT).  Stool DNA test.  Barium enema.  Virtual colonoscopy.  Sigmoidoscopy. During this test, a sigmoidoscope is used to examine your rectum and lower colon. A sigmoidoscope is a flexible tube with a camera that is inserted through your anus into your rectum and lower colon.  Colonoscopy. During this test, a colonoscope is used to examine your entire colon. A colonoscope is a long, thin, flexible tube with a camera. This test examines your entire colon and rectum.  This information is not intended to replace advice given to you by your health care provider. Make sure you discuss any questions you have with your health care provider. Document Released: 08/09/2009 Document Revised:  09/29/2015 Document Reviewed: 05/28/2013 Elsevier Interactive Patient Education  2018 Elsevier Inc.  

## 2017-04-19 NOTE — Progress Notes (Signed)
[name] Female    DOB: 10-07-1965   52 y.o.   MRN: [id number] Visit Date: 04/19/2017  Today's Provider: Shirlee LatchAngela Bacigalupo, MD   I, Joslyn HyEmily Ratchford, CMA, am acting as scribe for Shirlee LatchAngela Bacigalupo, MD.  Chief Complaint  Patient presents with  . Hypertension   Subjective:    HPI      Hypertension, follow-up:  BP Readings from Last 3 Encounters:  04/19/17 108/64  10/17/16 (!) 156/94  04/04/16 (!) 132/92    She was last seen for hypertension 6 months ago.  BP at that visit was 156/94. Pt believed this was due to stress, and wanted to recheck BP in 2-4 weeks before changing medications. Management since that visit includes continuing HCTZ. She reports excellent compliance with treatment. She is not having side effects.  She is exercising. Three times per week. Boot camp. She is adherent to low salt diet.   Outside blood pressures are not being checked. She is experiencing none.  Patient denies chest pain, chest pressure/discomfort, claudication, dyspnea, exertional chest pressure/discomfort, fatigue, irregular heart beat, lower extremity edema, near-syncope, orthopnea, palpitations and syncope.   Cardiovascular risk factors include hypertension and obesity (BMI >= 30 kg/m2).  Use of agents associated with hypertension: none.     Weight trend: fluctuating a bit Wt Readings from Last 3 Encounters:  04/19/17 236 lb (107 kg)  10/17/16 240 lb (108.9 kg)  04/04/16 229 lb (103.9 kg)    Current diet: pt states she is trying to incorporate a healthy diet, though admits "some days are better than others".  ------------------------------------------------------------------------ Pt has also noticed warts on her hands and feet. She states these have been present for about 2 years. The warts on her feet are growing in size. She has had them frozen before and is scared of that because it hurt last time.  She had a mammogram last in [place] in ~2010 She has never  had colon cancer screening  No Known Allergies   Current Outpatient Medications:  .  hydrochlorothiazide (HYDRODIURIL) 25 MG tablet, Take 1 tablet (25 mg total) by mouth daily., Disp: 90 tablet, Rfl: 1 .  naproxen (NAPROSYN) 500 MG tablet, Take 1 tablet (500 mg total) by mouth 2 (two) times daily as needed., Disp: 60 tablet, Rfl: 0 .  omeprazole (PRILOSEC) 20 MG capsule, Take 1 capsule (20 mg total) by mouth daily., Disp: 90 capsule, Rfl: 3 .  zolpidem (AMBIEN) 10 MG tablet, Take 1 tablet (10 mg total) by mouth at bedtime as needed. for sleep, Disp: 30 tablet, Rfl: 0 .  clotrimazole-betamethasone (LOTRISONE) cream, APPLY 1 APPLICATION TOPICALLY 2 (TWO) TIMES DAILY. (Patient not taking: Reported on 04/19/2017), Disp: 30 g, Rfl: 0  Review of Systems  Constitutional: Negative for activity change, appetite change, chills, diaphoresis, fatigue, fever and unexpected weight change.  HENT: Negative.   Eyes: Negative.   Respiratory: Negative for cough, chest tightness, shortness of breath and wheezing.   Cardiovascular: Negative for chest pain, palpitations and leg swelling.  Genitourinary: Negative.   Neurological: Negative.   Psychiatric/Behavioral: Negative.     Social History   Tobacco Use  . Smoking status: Never Smoker  . Smokeless tobacco: Never Used  Substance Use Topics  . Alcohol use: No   Objective:   BP 108/64 (BP Location: Left Arm, Patient Position: Sitting, Cuff Size: Large)   Pulse 86   Temp 97.7 F (36.5 C) (Oral)   Resp 16   Wt 236 lb (107 kg)  LMP 04/05/2017   SpO2 99%   BMI 39.27 kg/m  Vitals:   04/19/17 1341  BP: 108/64  Pulse: 86  Resp: 16  Temp: 97.7 F (36.5 C)  TempSrc: Oral  SpO2: 99%  Weight: 236 lb (107 kg)     Physical Exam  Constitutional: She is oriented to person, place, and time. She appears well-developed and well-nourished. No distress.  HENT:  Head: Normocephalic and atraumatic.  Eyes: Conjunctivae are normal. No scleral icterus.    Neck: Neck supple.  Cardiovascular: Normal rate, regular rhythm and intact distal pulses.  Pulmonary/Chest: Effort normal and breath sounds normal. No respiratory distress. She has no wheezes. She has no rales.  Musculoskeletal: She exhibits no edema or deformity.  Lymphadenopathy:    She has no cervical adenopathy.  Neurological: She is alert and oriented to person, place, and time.  Skin: Skin is warm and dry.  Palmar warts on b/l distal fingertips. Plantar warts on b/l feet Small early AK on L forehead  Psychiatric: She has a normal mood and affect. Her behavior is normal.  Vitals reviewed.      Assessment & Plan:      Problem List Items Addressed This Visit      Cardiovascular and Mediastinum   Essential (primary) hypertension    Well controlled currently Continue HCTZ Check CMP and screening lipid panel      Relevant Medications   hydrochlorothiazide (HYDRODIURIL) 25 MG tablet     Musculoskeletal and Integument   Palmar wart    Palmar and plantar warts present Discussed use of OTC salicylic acid with filing down first and taping over it at bedtime each night after applying She will try this in the meantime and set up f/u visit for cryotherapy Advised patient that recurrent visits may be necessary to resolve warts        Other   Adiposity    Discussed diet and exercise Congratulated her on 4lb weight loss      Relevant Orders   Lipid panel   Comprehensive metabolic panel   CBC w/Diff/Platelet    Other Visit Diagnoses    Colon cancer screening    -  Primary   Relevant Orders   Ambulatory referral to Gastroenterology   Breast cancer screening by mammogram       Relevant Orders   MM SCREENING BREAST TOMO BILATERAL       Return in about 6 months (around 10/17/2017) for physical.   The entirety of the information documented in the History of Present Illness, Review of Systems and Physical Exam were personally obtained by me. Portions of this information  were initially documented by Irving Burton Ratchford, CMA and reviewed by me for thoroughness and accuracy.    Erasmo Downer, MD, MPH [facility] 04/19/2017 3:03 PM

## 2017-04-19 NOTE — Assessment & Plan Note (Signed)
Well controlled currently Continue HCTZ Check CMP and screening lipid panel

## 2017-04-19 NOTE — Assessment & Plan Note (Signed)
Palmar and plantar warts present Discussed use of OTC salicylic acid with filing down first and taping over it at bedtime each night after applying She will try this in the meantime and set up f/u visit for cryotherapy Advised patient that recurrent visits may be necessary to resolve warts

## 2017-05-02 ENCOUNTER — Telehealth: Payer: Self-pay

## 2017-05-02 DIAGNOSIS — Z6839 Body mass index (BMI) 39.0-39.9, adult: Principal | ICD-10-CM

## 2017-05-02 DIAGNOSIS — E7800 Pure hypercholesterolemia, unspecified: Secondary | ICD-10-CM

## 2017-05-02 DIAGNOSIS — R739 Hyperglycemia, unspecified: Secondary | ICD-10-CM

## 2017-05-02 DIAGNOSIS — E669 Obesity, unspecified: Secondary | ICD-10-CM

## 2017-05-02 DIAGNOSIS — E785 Hyperlipidemia, unspecified: Secondary | ICD-10-CM | POA: Insufficient documentation

## 2017-05-02 LAB — CBC WITH DIFFERENTIAL/PLATELET
BASOS: 0 %
Basophils Absolute: 0 10*3/uL (ref 0.0–0.2)
EOS (ABSOLUTE): 0.1 10*3/uL (ref 0.0–0.4)
EOS: 2 %
HEMATOCRIT: 39 % (ref 34.0–46.6)
Hemoglobin: 13.3 g/dL (ref 11.1–15.9)
IMMATURE GRANULOCYTES: 0 %
Immature Grans (Abs): 0 10*3/uL (ref 0.0–0.1)
LYMPHS ABS: 2.5 10*3/uL (ref 0.7–3.1)
Lymphs: 27 %
MCH: 28.5 pg (ref 26.6–33.0)
MCHC: 34.1 g/dL (ref 31.5–35.7)
MCV: 84 fL (ref 79–97)
MONOS ABS: 0.6 10*3/uL (ref 0.1–0.9)
Monocytes: 6 %
Neutrophils Absolute: 6.2 10*3/uL (ref 1.4–7.0)
Neutrophils: 65 %
Platelets: 315 10*3/uL (ref 150–379)
RBC: 4.66 x10E6/uL (ref 3.77–5.28)
RDW: 13.4 % (ref 12.3–15.4)
WBC: 9.5 10*3/uL (ref 3.4–10.8)

## 2017-05-02 LAB — COMPREHENSIVE METABOLIC PANEL
ALT: 28 IU/L (ref 0–32)
AST: 22 IU/L (ref 0–40)
Albumin/Globulin Ratio: 1.4 (ref 1.2–2.2)
Albumin: 4.2 g/dL (ref 3.5–5.5)
Alkaline Phosphatase: 94 IU/L (ref 39–117)
BUN/Creatinine Ratio: 24 — ABNORMAL HIGH (ref 9–23)
BUN: 21 mg/dL (ref 6–24)
Bilirubin Total: 0.3 mg/dL (ref 0.0–1.2)
CALCIUM: 9.5 mg/dL (ref 8.7–10.2)
CO2: 26 mmol/L (ref 20–29)
CREATININE: 0.86 mg/dL (ref 0.57–1.00)
Chloride: 104 mmol/L (ref 96–106)
GFR calc Af Amer: 91 mL/min/{1.73_m2} (ref >59)
GFR, EST NON AFRICAN AMERICAN: 79 mL/min/{1.73_m2} (ref >59)
GLOBULIN, TOTAL: 2.9 g/dL (ref 1.5–4.5)
Glucose: 108 mg/dL — ABNORMAL HIGH (ref 65–99)
Potassium: 4 mmol/L (ref 3.5–5.2)
SODIUM: 144 mmol/L (ref 134–144)
TOTAL PROTEIN: 7.1 g/dL (ref 6.0–8.5)

## 2017-05-02 LAB — LIPID PANEL
CHOLESTEROL TOTAL: 193 mg/dL (ref 100–199)
Chol/HDL Ratio: 3.2 ratio (ref 0.0–4.4)
HDL: 60 mg/dL (ref >39)
LDL Calculated: 119 mg/dL — ABNORMAL HIGH (ref 0–99)
TRIGLYCERIDES: 68 mg/dL (ref 0–149)
VLDL Cholesterol Cal: 14 mg/dL (ref 5–40)

## 2017-05-02 NOTE — Telephone Encounter (Signed)
-----   Message from Erasmo DownerAngela M Bacigalupo, MD sent at 05/02/2017  8:20 AM EST ----- Cholesterol remains slightly high, but it is improved from last year.  Not high enough to recommend medication at this time.  Do recommend diet low in saturated fat and regular exercise (1430min/d, 5d/wk).  Normal kidney function, liver function, electrolytes, Blood counts.  Blood sugar is high if patient was fasting.  Recommend A1c to ensure no diabetes at next visit.  Erasmo DownerBacigalupo, Angela M, MD, MPH [facility] 05/02/2017 8:20 AM

## 2017-05-02 NOTE — Telephone Encounter (Signed)
Referral placed  Bacigalupo, Marzella SchleinAngela M, MD, MPH [facility] 05/02/2017 10:13 AM

## 2017-05-02 NOTE — Telephone Encounter (Signed)
Pt advised. She agrees with treatment plan. She is requesting referral to a nutritionist for high cholesterol/diabetic diet education. Please review.

## 2017-05-02 NOTE — Telephone Encounter (Signed)
Gastroenterology Pre-Procedure Review  Request Date: 08/02/17  Requesting Physician: Dr. Maximino Greenlandahiliani   PATIENT REVIEW QUESTIONS: The patient responded to the following health history questions as indicated:    1. Are you having any GI issues? No   2. Do you have a personal history of Polyps? No  3. Do you have a family history of Colon Cancer or Polyps? No  4. Diabetes Mellitus?  No  5. Joint replacements in the past 12 months? No  6. Major health problems in the past 3 months? No  7. Any artificial heart valves, MVP, or defibrillator? No     MEDICATIONS & ALLERGIES:    Patient reports the following regarding taking any anticoagulation/antiplatelet therapy:   Plavix, Coumadin, Eliquis, Xarelto, Lovenox, Pradaxa, Brilinta, or Effient? No  Aspirin? No   Patient confirms/reports the following medications:  Current Outpatient Medications  Medication Sig Dispense Refill  . clotrimazole-betamethasone (LOTRISONE) cream APPLY 1 APPLICATION TOPICALLY 2 (TWO) TIMES DAILY. (Patient not taking: Reported on 04/19/2017) 30 g 0  . hydrochlorothiazide (HYDRODIURIL) 25 MG tablet Take 1 tablet (25 mg total) by mouth daily. 90 tablet 1  . naproxen (NAPROSYN) 500 MG tablet Take 1 tablet (500 mg total) by mouth 2 (two) times daily as needed. 60 tablet 0  . omeprazole (PRILOSEC) 20 MG capsule Take 1 capsule (20 mg total) by mouth daily. 90 capsule 3  . zolpidem (AMBIEN) 10 MG tablet Take 1 tablet (10 mg total) by mouth at bedtime as needed. for sleep 30 tablet 0   No current facility-administered medications for this visit.     Patient confirms/reports the following allergies:  No Known Allergies  No orders of the defined types were placed in this encounter.   AUTHORIZATION INFORMATION Primary Insurance: 1D#: Group #:  Secondary Insurance: 1D#: Group #:  SCHEDULE INFORMATION: Date: 08/02/17  Time: Location: [facility]

## 2017-05-04 ENCOUNTER — Other Ambulatory Visit: Payer: Self-pay

## 2017-05-04 DIAGNOSIS — Z1211 Encounter for screening for malignant neoplasm of colon: Secondary | ICD-10-CM

## 2017-05-10 ENCOUNTER — Ambulatory Visit: Payer: BLUE CROSS/BLUE SHIELD | Admitting: Family Medicine

## 2017-05-10 ENCOUNTER — Encounter: Payer: Self-pay | Admitting: Family Medicine

## 2017-05-10 VITALS — BP 118/82 | HR 86 | Temp 97.9°F | Resp 16

## 2017-05-10 DIAGNOSIS — B078 Other viral warts: Secondary | ICD-10-CM

## 2017-05-10 NOTE — Progress Notes (Signed)
[name] Female    DOB: September 12, 1965   52 y.o.   MRN: [id number] Visit Date: 05/13/2017  Today's Provider: Shirlee LatchAngela Bacigalupo, MD   Chief Complaint  Patient presents with  . Verrucous Vulgaris   Subjective:    HPI   Pt has several warts on bilateral hands, and one wart on each sole of feet. She presents for removal of these warts.  She reports experience in the past of severe pain and blistering from Cryotherapy, but once before she had only minimal pain.  She states salicylic acid hasn't help and would like to try cryotherapy again.  No Known Allergies   Current Outpatient Medications:  .  clotrimazole-betamethasone (LOTRISONE) cream, APPLY 1 APPLICATION TOPICALLY 2 (TWO) TIMES DAILY., Disp: 30 g, Rfl: 0 .  hydrochlorothiazide (HYDRODIURIL) 25 MG tablet, Take 1 tablet (25 mg total) by mouth daily., Disp: 90 tablet, Rfl: 1 .  naproxen (NAPROSYN) 500 MG tablet, Take 1 tablet (500 mg total) by mouth 2 (two) times daily as needed., Disp: 60 tablet, Rfl: 0 .  omeprazole (PRILOSEC) 20 MG capsule, Take 1 capsule (20 mg total) by mouth daily., Disp: 90 capsule, Rfl: 3 .  zolpidem (AMBIEN) 10 MG tablet, Take 1 tablet (10 mg total) by mouth at bedtime as needed. for sleep, Disp: 30 tablet, Rfl: 0  Review of Systems  Constitutional: Negative for activity change, appetite change, chills, diaphoresis, fatigue, fever and unexpected weight change.  Respiratory: Negative for shortness of breath.   Cardiovascular: Negative for chest pain.  Skin:       Verruca are present    Social History   Tobacco Use  . Smoking status: Never Smoker  . Smokeless tobacco: Never Used  Substance Use Topics  . Alcohol use: No   Objective:   BP 118/82 (BP Location: Left Arm, Patient Position: Sitting, Cuff Size: Large)   Pulse 86   Temp 97.9 F (36.6 C) (Oral)   Resp 16   LMP 04/19/2017   SpO2 98%  Vitals:   05/10/17 1140  BP: 118/82  Pulse: 86  Resp: 16  Temp: 97.9 F (36.6  C)  TempSrc: Oral  SpO2: 98%     Physical Exam  Constitutional: She appears well-developed and well-nourished. No distress.  HENT:  Head: Normocephalic and atraumatic.  Cardiovascular: Normal rate, regular rhythm and intact distal pulses.  Pulmonary/Chest: Effort normal. No respiratory distress.  Musculoskeletal: She exhibits no edema.  Neurological: She is alert.  Skin: Skin is warm and dry.  +warts on R and left fingertips as well as on each foot  Vitals reviewed.      Assessment & Plan:      Problem List Items Addressed This Visit      Other   Verruca vulgaris - Primary    Palmar and plantar warts present See procedure note detailing cryotherapy Discussed use of salicylic acid with filing down and covering with duct tape qhs F/u in 3 weeks for repeat treatment Discussed that may take upwards of 6 cycles to fully remove         Return in about 3 weeks (around 05/31/2017) for wart removal.   The entirety of the information documented in the History of Present Illness, Review of Systems and Physical Exam were personally obtained by me. Portions of this information were initially documented by Irving BurtonEmily Ratchford, CMA and reviewed by me for thoroughness and accuracy.    Erasmo DownerBacigalupo, Angela M, MD, MPH [facility] 05/13/2017 8:17  AM

## 2017-05-13 NOTE — Assessment & Plan Note (Signed)
Palmar and plantar warts present See procedure note detailing cryotherapy Discussed use of salicylic acid with filing down and covering with duct tape qhs F/u in 3 weeks for repeat treatment Discussed that may take upwards of 6 cycles to fully remove

## 2017-05-13 NOTE — Progress Notes (Signed)
Cryotherapy Procedure Note  Pre-operative Diagnosis: verruca vulgaris  Post-operative Diagnosis: verruca vulgaris  Locations: R and L thumb and palmar aspect of digits  Indications: verruca vulgaris  Anesthesia: not required   Procedure Details  History of allergy to iodine: no. Pacemaker? no.  Patient informed of risks (permanent scarring, infection, light or dark discoloration, bleeding, infection, weakness, numbness and recurrence of the lesion) and benefits of the procedure and verbal informed consent obtained.  The areas are treated with liquid nitrogen therapy (cryo-pen), frozen until ice ball extended 1 mm beyond lesion, allowed to thaw, and treated again. The patient tolerated procedure well.  The patient was instructed on post-op care, warned that there may be blister formation, redness and pain. Recommend OTC analgesia as needed for pain.  Condition: Stable  Complications: none.  Plan: 1. Instructed to keep the area dry and clean thereafter. 2. Warning signs of infection were reviewed.   3. Recommended that the patient use OTC analgesics as needed for pain.  4. Return in 3 weeks.  Erasmo DownerBacigalupo, Jancarlos Thrun M, MD, MPH [facility] 05/13/2017 8:24 AM

## 2017-05-29 ENCOUNTER — Encounter: Payer: Self-pay | Admitting: Family Medicine

## 2017-05-29 ENCOUNTER — Ambulatory Visit: Payer: BLUE CROSS/BLUE SHIELD | Admitting: Family Medicine

## 2017-05-29 VITALS — BP 134/80 | HR 82 | Temp 98.2°F | Resp 16 | Wt 237.0 lb

## 2017-05-29 DIAGNOSIS — K219 Gastro-esophageal reflux disease without esophagitis: Secondary | ICD-10-CM | POA: Diagnosis not present

## 2017-05-29 DIAGNOSIS — B078 Other viral warts: Secondary | ICD-10-CM | POA: Diagnosis not present

## 2017-05-29 MED ORDER — OMEPRAZOLE 20 MG PO CPDR: 20.0000 mg | DELAYED_RELEASE_CAPSULE | Freq: Every day | ORAL | 3 refills | Status: DC

## 2017-05-29 NOTE — Progress Notes (Signed)
[name] Female    DOB: [birth year]   52 y.o.   MRN: [id number] Visit Date: 05/30/2017  Today's Provider: Shirlee Latch, MD   Chief Complaint  Patient presents with  . Verrucous Vulgaris   Subjective:    HPI   Pt presents for a FU of her cryopen treatment for warts. She has noticed some improvement with the treatment, and she is also using Compound W, with some relief.   Pt would also like a prescription of her Omeprazole to be sent to pharmacy. This is now covered under her pharmacy plan.  No Known Allergies   Current Outpatient Medications:  .  clotrimazole-betamethasone (LOTRISONE) cream, APPLY 1 APPLICATION TOPICALLY 2 (TWO) TIMES DAILY., Disp: 30 g, Rfl: 0 .  hydrochlorothiazide (HYDRODIURIL) 25 MG tablet, Take 1 tablet (25 mg total) by mouth daily., Disp: 90 tablet, Rfl: 1 .  naproxen (NAPROSYN) 500 MG tablet, Take 1 tablet (500 mg total) by mouth 2 (two) times daily as needed., Disp: 60 tablet, Rfl: 0 .  omeprazole (PRILOSEC) 20 MG capsule, Take 1 capsule (20 mg total) by mouth daily., Disp: 90 capsule, Rfl: 3 .  zolpidem (AMBIEN) 10 MG tablet, Take 1 tablet (10 mg total) by mouth at bedtime as needed. for sleep, Disp: 30 tablet, Rfl: 0  Review of Systems  Constitutional: Negative for activity change, appetite change, chills, diaphoresis, fatigue, fever and unexpected weight change.  Respiratory: Negative for shortness of breath.   Cardiovascular: Negative for chest pain, palpitations and leg swelling.  Skin:       Warts are present    Social History   Tobacco Use  . Smoking status: Never Smoker  . Smokeless tobacco: Never Used  Substance Use Topics  . Alcohol use: No   Objective:   BP 134/80 (BP Location: Left Arm, Patient Position: Sitting, Cuff Size: Large)   Pulse 82   Temp 98.2 F (36.8 C) (Oral)   Resp 16   Wt 237 lb (107.5 kg)   LMP 05/22/2017   SpO2 98%   BMI 39.44 kg/m  Vitals:   05/29/17 1603  BP: 134/80  Pulse: 82    Resp: 16  Temp: 98.2 F (36.8 C)  TempSrc: Oral  SpO2: 98%  Weight: 237 lb (107.5 kg)     Physical Exam  Constitutional: She is oriented to person, place, and time. She appears well-developed and well-nourished. No distress.  HENT:  Head: Normocephalic and atraumatic.  Cardiovascular: Normal rate and regular rhythm.  Pulmonary/Chest: Effort normal. No respiratory distress.  Musculoskeletal: She exhibits no edema.  Neurological: She is alert and oriented to person, place, and time.  Skin: Skin is warm and dry.  + warts present on fingertips of R and L hand as well as b/l feet  Vitals reviewed.       Assessment & Plan:      Problem List Items Addressed This Visit      Digestive   Acid reflux   Relevant Medications   omeprazole (PRILOSEC) 20 MG capsule     Other   Verruca vulgaris - Primary    Palmar and plantar warts present See procedure note detailing cryotherapy Discussed use of salicylic acid Follow-up in 3 weeks for repeat treatment Discussed that this may take upwards of 6 cycles to fully remove          Return in about 3 weeks (around 06/19/2017) for cryotherapy.   The entirety of the information documented in the History  of Present Illness, Review of Systems and Physical Exam were personally obtained by me. Portions of this information were initially documented by Irving BurtonEmily Ratchford, CMA and reviewed by me for thoroughness and accuracy.    Erasmo DownerBacigalupo, Kerrigan Gombos M, MD, MPH [facility] 05/30/2017 9:06 AM

## 2017-05-30 ENCOUNTER — Encounter: Payer: Self-pay | Admitting: Family Medicine

## 2017-05-30 NOTE — Assessment & Plan Note (Signed)
Palmar and plantar warts present See procedure note detailing cryotherapy Discussed use of salicylic acid Follow-up in 3 weeks for repeat treatment Discussed that this may take upwards of 6 cycles to fully remove

## 2017-05-30 NOTE — Progress Notes (Signed)
Cryotherapy Procedure Note  Pre-operative Diagnosis: verruca vulgaris  Post-operative Diagnosis: verruca vulgaris  Locations: R and L thumb and palmar aspect of digits, b/l feet  Indications: verruca vulgaris  Anesthesia: not required   Procedure Details  History of allergy to iodine: no. Pacemaker? no.  Patient informed of risks (permanent scarring, infection, light or dark discoloration, bleeding, infection, weakness, numbness and recurrence of the lesion) and benefits of the procedure and verbal informed consent obtained.  The areas are treated with liquid nitrogen therapy (cryo-pen), frozen until ice ball extended 1 mm beyond lesion, allowed to thaw, and treated again. The patient tolerated procedure well.  The patient was instructed on post-op care, warned that there may be blister formation, redness and pain. Recommend OTC analgesia as needed for pain.  Condition: Stable  Complications: none.  Plan: 1. Instructed to keep the area dry and clean thereafter. 2. Warning signs of infection were reviewed.   3. Recommended that the patient use OTC analgesics as needed for pain.  4. Return in 3 weeks.  Erasmo DownerBacigalupo, Kyndal Heringer M, MD, MPH [facility] 05/30/2017 9:07 AM

## 2017-06-11 ENCOUNTER — Encounter: Payer: Self-pay | Admitting: Dietician

## 2017-06-11 ENCOUNTER — Encounter: Payer: BLUE CROSS/BLUE SHIELD | Attending: Family Medicine | Admitting: Dietician

## 2017-06-11 VITALS — Ht 65.0 in | Wt 234.5 lb

## 2017-06-11 DIAGNOSIS — E7800 Pure hypercholesterolemia, unspecified: Secondary | ICD-10-CM | POA: Diagnosis not present

## 2017-06-11 DIAGNOSIS — R739 Hyperglycemia, unspecified: Secondary | ICD-10-CM | POA: Diagnosis not present

## 2017-06-11 DIAGNOSIS — E6609 Other obesity due to excess calories: Secondary | ICD-10-CM

## 2017-06-11 DIAGNOSIS — Z6839 Body mass index (BMI) 39.0-39.9, adult: Secondary | ICD-10-CM

## 2017-06-11 NOTE — Patient Instructions (Signed)
Balance meals with 2-4 servings of carbohydrate, 2-4 oz protein and non-starchy vegetables. Spread 12 servings of carbohydrate over 3 meals and 1-2 snacks. Work to be consistent with the balance of carbohydrate, protein and fat. Include fruit and milk as part of your carbohydrate.

## 2017-06-11 NOTE — Progress Notes (Signed)
Medical Nutrition Therapy: Visit start time: 1600  end time:1715 Assessment:  Diagnosis: hyperglycemia, hypercholesterolemia Past medical history: hypertension Psychosocial issues/ stress concerns: none identified Preferred learning method:  . Auditory . Visual . Hands-on Current weight: 234.5 (with shoes)  Height: 65 in Medications, supplements:see list  Progress and evaluation:  Patient in for initial medical nutrition therapy visit. She reports her weight has been within the same 10-15 lb range for the past 24 years (since her daughter was born). She gives a weight goal range of 150-175 lbs. Over the past 4-5 years she has made positive changes in her diet such as eliminating sodas and limiting diet sodas more recently, limiting sweets, chips, etc by not keeping them at home. More recently she and her husband have followed some diet principles of the Paleo diet. She likes to cook so most of her meals are prepared at home. She is open to a meal plan that provides some structure yet flexibility and one that can be sustained over time. She feels she is making some healthy choices but needs to "fine tune" portions.  Physical activity: no structured exercise presently. Plans to participate in a "boot camp" exercise program this summer.  Dietary Intake:  Usual eating pattern includes 3 meals    per day. Dining out frequency: 3 meals per week.  Breakfast:protein bar with 12 gms protein, or eggs/bacon on weekends Lunch: salami/cheese on sourdough bread with fruit Supper: chicken or some type of red meat and vegetables; mainly non-starchy vegetables  Beverages: 32 oz water, flavored water, occasional diet soda and tea  Nutrition Care Education:  Weight control/hyperglycemia: Instructed on a meal plan based on 1700 calories including carbohydrate counting and balance of carbohydrate, protein and non-starchy vegetables. Encouraged to use the meal plan as a guide and to avoid being overly  restrictive. Discussed benefit of whole grains in the diet. Hyperlipidemia:  Reviewed lipid profile based on Feb. Labwork. Instructed on guidelines for saturated fat and ways to decrease in the diet. Discussed reading labels for both carbohydrate and saturated fats.   Nutritional Diagnosis:  NI-1.5 Excessive energy intake As related to larger than recommended portions at some meals.  As evidenced by diet history..  Intervention:  Balance meals with 2-4 servings of carbohydrate, 2-4 oz protein and non-starchy vegetables. Spread 12 servings of carbohydrate over 3 meals and 1-2 snacks. Work to be consistent with the balance of carbohydrate, protein and fat. Include fruit and milk as part of your carbohydrate.   Education Materials given:  . Plate Planner . Food lists/ Planning A Balanced Meal . Sample meal pattern/ menu . Goals/ instructions  Learner/ who was taught:  . Patient  Level of understanding: Verbalizes understanding  Demonstrated degree of understanding via:   Teach back Learning barriers: . None  Willingness to learn/ readiness for change: . Eager, change in progress  Monitoring and Evaluation:  Dietary intake, exercise,  and body weight      follow up: 07/23/17 at 8:00am

## 2017-06-19 ENCOUNTER — Ambulatory Visit: Payer: Self-pay | Admitting: Family Medicine

## 2017-06-26 ENCOUNTER — Ambulatory Visit: Payer: BLUE CROSS/BLUE SHIELD | Admitting: Family Medicine

## 2017-06-26 VITALS — BP 118/78 | HR 79 | Temp 98.0°F | Resp 16

## 2017-06-26 DIAGNOSIS — B078 Other viral warts: Secondary | ICD-10-CM

## 2017-06-26 NOTE — Assessment & Plan Note (Signed)
Palmar and plantar warts present See procedure note detailing cryotherapy F/u in 3-4 weeks for repeat treatment Discussed that this may take upwards of 6 cycles to fully remove

## 2017-06-26 NOTE — Progress Notes (Signed)
Cryotherapy Procedure Note  Pre-operative Diagnosis: verruca vulgaris  Post-operative Diagnosis: verruca vulgaris  Locations: R and L thumb and palmar aspect of digits, b/l feet  Indications: verruca vulgaris  Anesthesia: not required   Procedure Details  History of allergy to iodine: no. Pacemaker? no  Patient informed of risks (permanent scarring, infection, light or dark discoloration, bleeding, infection, weakness, numbness and recurrence of the lesion) and benefits of the procedure and verbal informed consent obtained.  The areas are treated with liquid nitrogen therapy (cryo-pen), frozen until ice ball extended 1 mm beyond lesion, allowed to thaw, and treated again. The patient tolerated procedure well.  The patient was instructed on post-op care, warned that there may be blister formation, redness and pain. Recommend OTC analgesia as needed for pain.  Condition: Stable  Complications: none.  Plan: 1. Instructed to keep the area dry and clean thereafter. 2. Warning signs of infection were reviewed.   3. Recommended that the patient use OTC analgesics as needed for pain.  4. Return in 3-4 weeks.  Erasmo DownerBacigalupo, Munira Polson M, MD, MPH [facility] 06/26/2017 4:03 PM

## 2017-06-26 NOTE — Progress Notes (Signed)
       [name] Female    DOB: 1965/08/23   52 y.o.   MRN: [id number] Visit Date: 06/26/2017  Today's Provider: Shirlee LatchAngela Bacigalupo, MD   Chief Complaint  Patient presents with  . Verrucous Vulgaris   Subjective:    HPI   Pt presents for a FU of her cryopen treatment for warts. This is treatment #3.  She has noticed some improvement with the treatment, as warts are getting smaller.  She is no longer using Salicylic acid as it was causing skin to peel excessively.   No Known Allergies   Current Outpatient Medications:  .  clotrimazole-betamethasone (LOTRISONE) cream, APPLY 1 APPLICATION TOPICALLY 2 (TWO) TIMES DAILY., Disp: 30 g, Rfl: 0 .  hydrochlorothiazide (HYDRODIURIL) 25 MG tablet, Take 1 tablet (25 mg total) by mouth daily., Disp: 90 tablet, Rfl: 1 .  naproxen (NAPROSYN) 500 MG tablet, Take 1 tablet (500 mg total) by mouth 2 (two) times daily as needed., Disp: 60 tablet, Rfl: 0 .  omeprazole (PRILOSEC) 20 MG capsule, Take 1 capsule (20 mg total) by mouth daily., Disp: 90 capsule, Rfl: 3 .  zolpidem (AMBIEN) 10 MG tablet, Take 1 tablet (10 mg total) by mouth at bedtime as needed. for sleep, Disp: 30 tablet, Rfl: 0  Review of Systems  Constitutional: Negative for activity change, appetite change, chills, diaphoresis, fatigue, fever and unexpected weight change.  HENT: Negative.   Respiratory: Negative for apnea, cough, chest tightness and shortness of breath.   Cardiovascular: Negative for chest pain, palpitations and leg swelling.  Skin:       Warts are present    Social History   Tobacco Use  . Smoking status: Never Smoker  . Smokeless tobacco: Never Used  Substance Use Topics  . Alcohol use: No   Objective:   BP 118/78 (BP Location: Left Arm, Patient Position: Sitting, Cuff Size: Large)   Pulse 79   Temp 98 F (36.7 C) (Oral)   Resp 16   LMP 06/19/2017   SpO2 97%  Vitals:   06/26/17 1606  BP: 118/78  Pulse: 79  Resp: 16  Temp: 98 F (36.7  C)  TempSrc: Oral  SpO2: 97%     Physical Exam  Constitutional: She is oriented to person, place, and time. She appears well-developed and well-nourished. No distress.  HENT:  Head: Normocephalic and atraumatic.  Cardiovascular: Normal rate and regular rhythm.  Pulmonary/Chest: Effort normal. No respiratory distress.  Musculoskeletal: She exhibits no edema.  Neurological: She is alert and oriented to person, place, and time.  Skin: Skin is warm and dry.  + warts present on fingertips of R and L hand as well as b/l feet  Psychiatric: She has a normal mood and affect. Her behavior is normal.  Vitals reviewed.     Assessment & Plan:      Problem List Items Addressed This Visit      Other   Verruca vulgaris - Primary    Palmar and plantar warts present See procedure note detailing cryotherapy F/u in 3-4 weeks for repeat treatment Discussed that this may take upwards of 6 cycles to fully remove          Return in about 1 month (around 07/24/2017) for Cryopen treatment.   Erasmo DownerBacigalupo, Angela M, MD, MPH [facility] 06/26/2017 4:38 PM

## 2017-07-04 ENCOUNTER — Other Ambulatory Visit: Payer: Self-pay | Admitting: Family Medicine

## 2017-07-04 DIAGNOSIS — K219 Gastro-esophageal reflux disease without esophagitis: Secondary | ICD-10-CM

## 2017-07-04 NOTE — Telephone Encounter (Signed)
[facility] faxed refill request for the following medications:  omeprazole (PRILOSEC) 20 MG capsule  90 day supply  Last Rx: 05/29/17 was sent to [facility] LOV: 06/26/17  Both [facility] and [facility] are on pt's preferred pharmacy list. Please advise. Thanks TNP

## 2017-07-05 MED ORDER — OMEPRAZOLE 20 MG PO CPDR: 20.0000 mg | DELAYED_RELEASE_CAPSULE | Freq: Every day | ORAL | 3 refills | Status: DC

## 2017-07-23 ENCOUNTER — Ambulatory Visit: Payer: BLUE CROSS/BLUE SHIELD | Admitting: Dietician

## 2017-07-24 ENCOUNTER — Ambulatory Visit: Payer: Self-pay | Admitting: Family Medicine

## 2017-07-25 ENCOUNTER — Telehealth: Payer: Self-pay | Admitting: Dietician

## 2017-07-25 NOTE — Telephone Encounter (Signed)
Called patient to see if she wants to reschedule her missed appointment on 07/23/17. She states she will call back next week when she will be able to look at her work schedule.

## 2017-08-01 ENCOUNTER — Encounter: Payer: Self-pay | Admitting: *Deleted

## 2017-08-02 ENCOUNTER — Encounter
Admission: RE | Disposition: A | Discharge status: Home / Self Care | Payer: Self-pay | Source: Ambulatory Visit | Attending: Gastroenterology

## 2017-08-02 ENCOUNTER — Encounter: Payer: Self-pay | Admitting: Anesthesiology

## 2017-08-02 ENCOUNTER — Ambulatory Visit: Payer: BLUE CROSS/BLUE SHIELD | Admitting: Certified Registered"

## 2017-08-02 ENCOUNTER — Ambulatory Visit
Admission: RE | Admit: 2017-08-02 | Discharge: 2017-08-02 | Disposition: A | Discharge status: Home / Self Care | Payer: BLUE CROSS/BLUE SHIELD | Source: Ambulatory Visit | Attending: Gastroenterology | Admitting: Gastroenterology

## 2017-08-02 DIAGNOSIS — K5790 Diverticulosis of intestine, part unspecified, without perforation or abscess without bleeding: Secondary | ICD-10-CM | POA: Insufficient documentation

## 2017-08-02 DIAGNOSIS — K5730 Diverticulosis of large intestine without perforation or abscess without bleeding: Secondary | ICD-10-CM

## 2017-08-02 DIAGNOSIS — Z6836 Body mass index (BMI) 36.0-36.9, adult: Secondary | ICD-10-CM | POA: Insufficient documentation

## 2017-08-02 DIAGNOSIS — K6389 Other specified diseases of intestine: Secondary | ICD-10-CM

## 2017-08-02 DIAGNOSIS — E669 Obesity, unspecified: Secondary | ICD-10-CM | POA: Insufficient documentation

## 2017-08-02 DIAGNOSIS — Z79899 Other long term (current) drug therapy: Secondary | ICD-10-CM | POA: Insufficient documentation

## 2017-08-02 DIAGNOSIS — Z791 Long term (current) use of non-steroidal anti-inflammatories (NSAID): Secondary | ICD-10-CM | POA: Insufficient documentation

## 2017-08-02 DIAGNOSIS — K644 Residual hemorrhoidal skin tags: Secondary | ICD-10-CM | POA: Diagnosis not present

## 2017-08-02 DIAGNOSIS — K648 Other hemorrhoids: Secondary | ICD-10-CM | POA: Diagnosis not present

## 2017-08-02 DIAGNOSIS — Z1211 Encounter for screening for malignant neoplasm of colon: Secondary | ICD-10-CM | POA: Diagnosis not present

## 2017-08-02 HISTORY — PX: COLONOSCOPY WITH PROPOFOL: SHX5780

## 2017-08-02 SURGERY — COLONOSCOPY WITH PROPOFOL
Anesthesia: General

## 2017-08-02 MED ORDER — PROPOFOL 500 MG/50ML IV EMUL
INTRAVENOUS | Status: DC | PRN
  Administered 2017-08-02: 110 ug/kg/min via INTRAVENOUS

## 2017-08-02 MED ORDER — PROPOFOL 500 MG/50ML IV EMUL
INTRAVENOUS | Status: AC
  Filled 2017-08-02: qty 50

## 2017-08-02 MED ORDER — PHENYLEPHRINE HCL 10 MG/ML IJ SOLN
INTRAMUSCULAR | Status: AC
  Filled 2017-08-02: qty 1

## 2017-08-02 MED ORDER — LIDOCAINE HCL (PF) 2 % IJ SOLN
INTRAMUSCULAR | Status: AC
  Filled 2017-08-02: qty 10

## 2017-08-02 MED ORDER — LIDOCAINE HCL (CARDIAC) PF 100 MG/5ML IV SOSY
PREFILLED_SYRINGE | INTRAVENOUS | Status: DC | PRN
  Administered 2017-08-02: 50 mg via INTRAVENOUS

## 2017-08-02 MED ORDER — PROPOFOL 10 MG/ML IV BOLUS
INTRAVENOUS | Status: DC | PRN
  Administered 2017-08-02: 20 mg via INTRAVENOUS
  Administered 2017-08-02: 10 mg via INTRAVENOUS
  Administered 2017-08-02: 70 mg via INTRAVENOUS

## 2017-08-02 MED ORDER — SODIUM CHLORIDE 0.9 % IV SOLN
INTRAVENOUS | Status: DC
  Administered 2017-08-02: 1000 mL via INTRAVENOUS

## 2017-08-02 NOTE — Transfer of Care (Signed)
Immediate Anesthesia Transfer of Care Note  [name]  Procedure(s) Performed: COLONOSCOPY WITH PROPOFOL (N/A )  Patient Location: PACU  Anesthesia Type:General  Level of Consciousness: sedated and responds to stimulation  Airway & Oxygen Therapy: Patient Spontanous Breathing and Patient connected to nasal cannula oxygen  Post-op Assessment: Report given to RN and Post -op Vital signs reviewed and stable  Post vital signs: Reviewed and stable  Last Vitals:  Vitals Value Taken Time  BP 101/62 08/02/2017  8:40 AM  Temp 35.8 C 08/02/2017  8:30 AM  Pulse 69 08/02/2017  8:40 AM  Resp 15 08/02/2017  8:40 AM  SpO2 98 % 08/02/2017  8:40 AM    Last Pain:  Vitals:   08/02/17 0830  TempSrc: Tympanic  PainSc:          Complications: No apparent anesthesia complications

## 2017-08-02 NOTE — Anesthesia Postprocedure Evaluation (Signed)
Anesthesia Post Note  [name]  Procedure(s) Performed: COLONOSCOPY WITH PROPOFOL (N/A )  Patient location during evaluation: Endoscopy Anesthesia Type: General Level of consciousness: awake and alert Pain management: pain level controlled Vital Signs Assessment: post-procedure vital signs reviewed and stable Respiratory status: spontaneous breathing, nonlabored ventilation, respiratory function stable and patient connected to nasal cannula oxygen Cardiovascular status: blood pressure returned to baseline and stable Postop Assessment: no apparent nausea or vomiting Anesthetic complications: no     Last Vitals:  Vitals:   08/02/17 0900 08/02/17 0910  BP: 138/79 125/81  Pulse: (!) 54 (!) 52  Resp: 14 14  Temp:    SpO2: 99% 100%    Last Pain:  Vitals:   08/02/17 0830  TempSrc: Tympanic  PainSc:                  [name]

## 2017-08-02 NOTE — Anesthesia Post-op Follow-up Note (Signed)
Anesthesia QCDR form completed.        

## 2017-08-02 NOTE — Anesthesia Preprocedure Evaluation (Addendum)
Anesthesia Evaluation  Patient identified by MRN, date of birth, ID band Patient awake    Reviewed: Allergy & Precautions, H&P , NPO status , Patient's Chart, lab work & pertinent test results  History of Anesthesia Complications Negative for: history of anesthetic complications  Airway Mallampati: III  TM Distance: <3 FB Neck ROM: full    Dental  (+) Chipped   Pulmonary neg shortness of breath, sleep apnea ,           Cardiovascular Exercise Tolerance: Good hypertension, (-) angina(-) Past MI and (-) DOE      Neuro/Psych negative neurological ROS  negative psych ROS   GI/Hepatic Neg liver ROS, GERD  Medicated and Controlled,  Endo/Other  negative endocrine ROS  Renal/GU negative Renal ROS  negative genitourinary   Musculoskeletal   Abdominal   Peds  Hematology negative hematology ROS (+)   Anesthesia Other Findings Past Medical History: No date: Obesity  Past Surgical History: No date: CESAREAN SECTION  BMI    Body Mass Index:  36.87 kg/m      Reproductive/Obstetrics negative OB ROS                             Anesthesia Physical Anesthesia Plan  ASA: III  Anesthesia Plan: General   Post-op Pain Management:    Induction: Intravenous  PONV Risk Score and Plan: Propofol infusion and TIVA  Airway Management Planned: Natural Airway and Nasal Cannula  Additional Equipment:   Intra-op Plan:   Post-operative Plan:   Informed Consent: I have reviewed the patients History and Physical, chart, labs and discussed the procedure including the risks, benefits and alternatives for the proposed anesthesia with the patient or authorized representative who has indicated his/her understanding and acceptance.   Dental Advisory Given  Plan Discussed with: Anesthesiologist, CRNA and Surgeon  Anesthesia Plan Comments: (Patient unable to void for UPreg.  She was consented that  anesthesia in the first trimester could be harmful to a fetus if she was pregnant and she voiced understanding.  Patient consented for risks of anesthesia including but not limited to:  - adverse reactions to medications - risk of intubation if required - damage to teeth, lips or other oral mucosa - sore throat or hoarseness - Damage to heart, brain, lungs or loss of life  Patient voiced understanding.)       Anesthesia Quick Evaluation

## 2017-08-02 NOTE — Op Note (Signed)
[facility] Patient Name: Mackenzie Newman Procedure Date: 08/02/2017 7:56 AM MRN: [id number] Account #: [id number] Date of Birth: 03-16-1965 Admit Type: Outpatient Age: 52 Room: [facility] ENDO ROOM 2 Gender: Female Note Status: Finalized Procedure:            Colonoscopy Indications:          Screening for colorectal malignant neoplasm Providers:            Genetta Fiero B. Maximino Greenland MD, MD Referring MD:         Marzella Schlein. Bacigalupo (Referring MD) Medicines:            Monitored Anesthesia Care Complications:        No immediate complications. Procedure:            Pre-Anesthesia Assessment:                       - Prior to the procedure, a History and Physical was                        performed, and patient medications, allergies and                        sensitivities were reviewed. The patient's tolerance of                        previous anesthesia was reviewed.                       - The risks and benefits of the procedure and the                        sedation options and risks were discussed with the                        patient. All questions were answered and informed                        consent was obtained.                       - Patient identification and proposed procedure were                        verified prior to the procedure by the physician, the                        nurse, the anesthetist and the technician. The                        procedure was verified in the pre-procedure area in the                        procedure room in the endoscopy suite.                       - ASA Grade Assessment: III - A patient with severe                        systemic disease.                       -  After reviewing the risks and benefits, the patient                        was deemed in satisfactory condition to undergo the                        procedure.                       After obtaining informed consent, the colonoscope was                       passed under direct vision. Throughout the procedure,                        the patient's blood pressure, pulse, and oxygen                        saturations were monitored continuously. The                        Colonoscope was introduced through the anus and                        advanced to the the terminal ileum. The colonoscopy was                        performed with ease. The patient tolerated the                        procedure well. The quality of the bowel preparation                        was good. Findings:      The perianal exam findings include non-thrombosed external hemorrhoids.      A patchy area of mildly melanotic mucosa was found in the cecum.       Biopsies were taken with a cold forceps for histology.      A few diverticula were found in the sigmoid colon and descending colon.      The exam was otherwise without abnormality.      The rectum, sigmoid colon, descending colon, transverse colon, ascending       colon, cecum and ileum appeared normal.      Non-bleeding internal hemorrhoids were found during retroflexion. Impression:           - Non-thrombosed external hemorrhoids found on perianal                        exam.                       - Melanotic mucosa in the cecum. Biopsied.                       - Diverticulosis in the sigmoid colon and in the                        descending colon.                       - The examination was otherwise normal.                       -  The rectum, sigmoid colon, descending colon,                        transverse colon, ascending colon, cecum and terminal                        ileum are normal.                       - Non-bleeding internal hemorrhoids. Recommendation:       - Discharge patient to home.                       - Resume previous diet.                       - Continue present medications.                       - Repeat colonoscopy in 10 years for screening purposes.                        - Return to primary care physician as previously                        scheduled.                       - The findings and recommendations were discussed with                        the patient.                       - The findings and recommendations were discussed with                        the patient's family.                       - High fiber diet. Procedure Code(s):    --- Professional ---                       [phone], Colonoscopy, flexible; with biopsy, single or                        multiple Diagnosis Code(s):    --- Professional ---                       Z12.11, Encounter for screening for malignant neoplasm                        of colon                       K63.89, Other specified diseases of intestine                       K64.4, Residual hemorrhoidal skin tags                       K64.8, Other hemorrhoids  K57.30, Diverticulosis of large intestine without                        perforation or abscess without bleeding CPT copyright 2017 American Medical Association. All rights reserved. The codes documented in this report are preliminary and upon coder review may  be revised to meet current compliance requirements.  Melodie Bouillon, MD Michel Bickers B. Maximino Greenland MD, MD 08/02/2017 8:39:56 AM This report has been signed electronically. Number of Addenda: 0 Note Initiated On: 08/02/2017 7:56 AM Scope Withdrawal Time: 0 hours 14 minutes 27 seconds  Total Procedure Duration: 0 hours 17 minutes 42 seconds  Estimated Blood Loss: Estimated blood loss: none.      [facility]

## 2017-08-02 NOTE — Anesthesia Procedure Notes (Signed)
Performed by: Evalin Shawhan, CRNA Pre-anesthesia Checklist: Patient identified, Emergency Drugs available, Suction available, Patient being monitored and Timeout performed Patient Re-evaluated:Patient Re-evaluated prior to induction Oxygen Delivery Method: Nasal cannula       

## 2017-08-02 NOTE — H&P (Signed)
Melodie BouillonVarnita Delwyn Scoggin, MD [address], [address], [place], [place], [id number] [address], [address], [place], [place], [address] Phone: [phone]  Fax: [phone]  Primary Care Physician:  Erasmo DownerBacigalupo, Angela M, MD   Pre-Procedure History & Physical: HPI:  Mackenzie Newman is a 52 y.o. female is here for a colonoscopy.   Past Medical History:  Diagnosis Date  . Obesity     Past Surgical History:  Procedure Laterality Date  . CESAREAN SECTION      Prior to Admission medications   Medication Sig Start Date End Date Taking? Authorizing Provider  clotrimazole-betamethasone (LOTRISONE) cream APPLY 1 APPLICATION TOPICALLY 2 (TWO) TIMES DAILY. 10/02/16  Yes Maple HudsonGilbert, Richard L Jr., MD  hydrochlorothiazide (HYDRODIURIL) 25 MG tablet Take 1 tablet (25 mg total) by mouth daily. 04/19/17  Yes Bacigalupo, Marzella SchleinAngela M, MD  naproxen (NAPROSYN) 500 MG tablet Take 1 tablet (500 mg total) by mouth 2 (two) times daily as needed. 03/21/17  Yes Bacigalupo, Marzella SchleinAngela M, MD  omeprazole (PRILOSEC) 20 MG capsule Take 1 capsule (20 mg total) by mouth daily. 07/05/17  Yes Bacigalupo, Marzella SchleinAngela M, MD  zolpidem (AMBIEN) 10 MG tablet Take 1 tablet (10 mg total) by mouth at bedtime as needed. for sleep 03/21/17  Yes Erasmo DownerBacigalupo, Angela M, MD    Allergies as of 05/06/2017  . (No Known Allergies)    Family History  Adopted: Yes  Family history unknown: Yes    Social History   Socioeconomic History  . Marital status: Married    Spouse name: Not on file  . Number of children: Not on file  . Years of education: Not on file  . Highest education level: Not on file  Occupational History  . Not on file  Social Needs  . Financial resource strain: Not on file  . Food insecurity:    Worry: Not on file    Inability: Not on file  . Transportation needs:    Medical: Not on file    Non-medical: Not on file  Tobacco Use  . Smoking status: Never Smoker  . Smokeless tobacco: Never Used  Substance and Sexual Activity    . Alcohol use: No  . Drug use: No  . Sexual activity: Yes  Lifestyle  . Physical activity:    Days per week: Not on file    Minutes per session: Not on file  . Stress: Not on file  Relationships  . Social connections:    Talks on phone: Not on file    Gets together: Not on file    Attends religious service: Not on file    Active member of club or organization: Not on file    Attends meetings of clubs or organizations: Not on file    Relationship status: Not on file  . Intimate partner violence:    Fear of current or ex partner: Not on file    Emotionally abused: Not on file    Physically abused: Not on file    Forced sexual activity: Not on file  Other Topics Concern  . Not on file  Social History Narrative  . Not on file    Review of Systems: See HPI, otherwise negative ROS  Physical Exam: BP 136/82   Pulse 79   Temp 97.6 F (36.4 C) (Tympanic)   Resp 20   Ht 5' 5.5" (1.664 m)   Wt 225 lb (102.1 kg)   SpO2 96%   BMI 36.87 kg/m  General:   Alert,  pleasant and cooperative in  NAD Head:  Normocephalic and atraumatic. Neck:  Supple; no masses or thyromegaly. Lungs:  Clear throughout to auscultation, normal respiratory effort.    Heart:  +S1, +S2, Regular rate and rhythm, No edema. Abdomen:  Soft, nontender and nondistended. Normal bowel sounds, without guarding, and without rebound.   Neurologic:  Alert and  oriented x4;  grossly normal neurologically.  Impression/Plan: Mackenzie Newman is here for a colonoscopy to be performed for average risk screening.  Risks, benefits, limitations, and alternatives regarding  colonoscopy have been reviewed with the patient.  Questions have been answered.  All parties agreeable.   Pasty Spillers, MD  08/02/2017, 8:03 AM

## 2017-08-05 ENCOUNTER — Encounter: Payer: Self-pay | Admitting: Gastroenterology

## 2017-08-05 LAB — SURGICAL PATHOLOGY

## 2017-08-07 ENCOUNTER — Encounter: Payer: Self-pay | Admitting: Gastroenterology

## 2017-08-22 ENCOUNTER — Encounter: Payer: Self-pay | Admitting: Dietician

## 2017-08-28 ENCOUNTER — Other Ambulatory Visit: Payer: Self-pay

## 2017-08-28 DIAGNOSIS — I10 Essential (primary) hypertension: Secondary | ICD-10-CM

## 2017-08-28 MED ORDER — HYDROCHLOROTHIAZIDE 25 MG PO TABS: 25.0000 mg | ORAL_TABLET | Freq: Every day | ORAL | 0 refills | Status: DC

## 2017-08-28 NOTE — Telephone Encounter (Signed)
This is Dr. Senaida LangeB's patient. Patient needs a refill on this medication. She states she requested a refill through her pharmacy, and they told her that she needed an office visit with her doctor. Patient states she has been in the office several times this year and even had a physical. Patient says each time she was seen in the office, she was told a refill would be sent in for this medication. Patient is now completely out of medication and needs a refill. [facility] [place]

## 2017-09-11 ENCOUNTER — Ambulatory Visit (INDEPENDENT_AMBULATORY_CARE_PROVIDER_SITE_OTHER): Payer: BLUE CROSS/BLUE SHIELD | Admitting: Family Medicine

## 2017-09-11 DIAGNOSIS — B078 Other viral warts: Secondary | ICD-10-CM

## 2017-09-13 NOTE — Progress Notes (Signed)
No show. Derm referral placed

## 2017-10-18 ENCOUNTER — Encounter: Payer: Self-pay | Admitting: Family Medicine

## 2017-10-23 ENCOUNTER — Other Ambulatory Visit: Payer: Self-pay | Admitting: Family Medicine

## 2017-10-23 MED ORDER — ZOLPIDEM TARTRATE 10 MG PO TABS: 10.0000 mg | ORAL_TABLET | Freq: Every evening | ORAL | 1 refills | Status: DC | PRN

## 2017-10-23 NOTE — Telephone Encounter (Signed)
[facility] faxed a refill request for the following medication. Thanks CC  zolpidem (AMBIEN) 10 MG tablet

## 2017-10-23 NOTE — Addendum Note (Signed)
Addended by: Erasmo DownerBACIGALUPO, ANGELA M on: 10/23/2017 10:53 AM   Modules accepted: Orders

## 2017-11-22 ENCOUNTER — Other Ambulatory Visit: Payer: Self-pay | Admitting: Physician Assistant

## 2017-11-22 DIAGNOSIS — I10 Essential (primary) hypertension: Secondary | ICD-10-CM

## 2018-01-09 ENCOUNTER — Other Ambulatory Visit: Payer: Self-pay | Admitting: Family Medicine

## 2018-01-09 DIAGNOSIS — K219 Gastro-esophageal reflux disease without esophagitis: Secondary | ICD-10-CM

## 2018-01-09 MED ORDER — OMEPRAZOLE 20 MG PO CPDR: 20.0000 mg | DELAYED_RELEASE_CAPSULE | Freq: Every day | ORAL | 3 refills | Status: DC

## 2018-01-09 NOTE — Telephone Encounter (Signed)
[facility] a refill request for the following medication. Thanks CC  omeprazole (PRILOSEC) 20 MG capsule

## 2018-01-19 ENCOUNTER — Other Ambulatory Visit: Payer: Self-pay | Admitting: Family Medicine

## 2018-01-19 DIAGNOSIS — I10 Essential (primary) hypertension: Secondary | ICD-10-CM

## 2018-02-24 ENCOUNTER — Ambulatory Visit: Payer: BLUE CROSS/BLUE SHIELD | Admitting: Family Medicine

## 2018-02-24 ENCOUNTER — Encounter: Payer: Self-pay | Admitting: Family Medicine

## 2018-02-24 ENCOUNTER — Other Ambulatory Visit: Payer: Self-pay

## 2018-02-24 VITALS — BP 160/100 | HR 85 | Temp 97.7°F | Ht 65.5 in | Wt 226.4 lb

## 2018-02-24 DIAGNOSIS — J069 Acute upper respiratory infection, unspecified: Secondary | ICD-10-CM

## 2018-02-24 MED ORDER — HYDROCODONE-HOMATROPINE 5-1.5 MG/5ML PO SYRP: 5.0000 mL | ORAL_SOLUTION | Freq: Four times a day (QID) | ORAL | 0 refills | Status: AC | PRN

## 2018-02-24 NOTE — Progress Notes (Signed)
  Subjective:     Patient ID: Mackenzie Newman, female   DOB: [birth year], 52 y.o.   MRN: [id number] Chief Complaint  Patient presents with  . Cough    since wed 02/19/18 which has history of bronchitis,  pt reports that she is somewhat calm today with cough and has taken delsym since friday and taking more than suppose to.      HPI Reports cold sx-states sinus pressure is improving and cough is now non-productive.. Reports paroxysms of cough contributing to incontinence.  Review of Systems     Objective:   Physical Exam Constitutional:      General: She is not in acute distress.    Appearance: Normal appearance. She is not ill-appearing.   Ears: T.M's intact without inflammation Sinuses: non-tender Throat: no tonsillar enlargement or exudate Neck: no cervical adenopathy Lungs: clear     Assessment:    1. URI, acute - HYDROcodone-homatropine (HYCODAN) 5-1.5 MG/5ML syrup; Take 5 mLs by mouth every 6 (six) hours as needed for up to 5 days. 5 ml 4-6 hours as needed for cough  Dispense: 100 mL; Refill: 0    Plan:    Start Mucinex DM.

## 2018-02-24 NOTE — Patient Instructions (Signed)
Discussed use of Mucinex DM. 

## 2018-04-22 ENCOUNTER — Other Ambulatory Visit: Payer: Self-pay | Admitting: Family Medicine

## 2018-04-22 DIAGNOSIS — I10 Essential (primary) hypertension: Secondary | ICD-10-CM

## 2018-07-22 ENCOUNTER — Other Ambulatory Visit: Payer: Self-pay | Admitting: Family Medicine

## 2018-07-22 DIAGNOSIS — K219 Gastro-esophageal reflux disease without esophagitis: Secondary | ICD-10-CM

## 2018-07-22 NOTE — Telephone Encounter (Signed)
Please review

## 2019-03-04 ENCOUNTER — Ambulatory Visit: Payer: Managed Care, Other (non HMO) | Attending: Internal Medicine

## 2019-03-04 ENCOUNTER — Other Ambulatory Visit: Payer: Self-pay

## 2019-03-04 DIAGNOSIS — Z20822 Contact with and (suspected) exposure to covid-19: Secondary | ICD-10-CM

## 2019-03-05 LAB — NOVEL CORONAVIRUS, NAA: SARS-CoV-2, NAA: DETECTED — AB

## 2019-03-07 ENCOUNTER — Telehealth: Payer: Self-pay | Admitting: Infectious Diseases

## 2019-03-07 NOTE — Telephone Encounter (Signed)
Called to discuss with patient about Covid symptoms and the use of bamlanivimab, a monoclonal antibody infusion for those with mild to moderate Covid symptoms and at a high risk of hospitalization.  Pt is qualified for this infusion at the [facility] infusion center due to BMI>35   She will discuss with her PCP and consider for her husband also.

## 2019-03-27 ENCOUNTER — Encounter: Payer: Self-pay | Admitting: Family Medicine

## 2019-03-27 ENCOUNTER — Ambulatory Visit (INDEPENDENT_AMBULATORY_CARE_PROVIDER_SITE_OTHER): Payer: Managed Care, Other (non HMO) | Admitting: Family Medicine

## 2019-03-27 VITALS — BP 133/76 | Ht 65.0 in | Wt 230.0 lb

## 2019-03-27 DIAGNOSIS — G4700 Insomnia, unspecified: Secondary | ICD-10-CM

## 2019-03-27 DIAGNOSIS — R0683 Snoring: Secondary | ICD-10-CM

## 2019-03-27 DIAGNOSIS — G478 Other sleep disorders: Secondary | ICD-10-CM

## 2019-03-27 MED ORDER — ZOLPIDEM TARTRATE 10 MG PO TABS: 10.0000 mg | ORAL_TABLET | Freq: Every evening | ORAL | 1 refills | Status: DC | PRN

## 2019-03-27 MED ORDER — CLOTRIMAZOLE-BETAMETHASONE 1-0.05 % EX CREA: 1.0000 "application " | TOPICAL_CREAM | Freq: Two times a day (BID) | CUTANEOUS | 2 refills | Status: DC

## 2019-03-27 NOTE — Patient Instructions (Signed)

## 2019-03-27 NOTE — Progress Notes (Signed)
[name] Female    DOB: September 08, 1965   54 y.o.   MRN: [id number] Visit Date: 03/27/2019  Today's Provider: Shirlee Latch, MD   Chief Complaint  Patient presents with  . Sleep Apnea   Subjective:    I [place] S. Dimas, CMA, am acting as scribe for Shirlee Latch, MD.   Virtual Visit via Video Note  I connected with Mackenzie Newman on 03/27/19 at  2:40 PM EST by a video enabled telemedicine application and verified that I am speaking with the correct person using two identifiers.  Location: Patient location: home Provider location: [facility] Persons involved in the visit: patient, provider   I discussed the limitations of evaluation and management by telemedicine and the availability of in person appointments. The patient expressed understanding and agreed to proceed.   HPI Adeola Sampsel ia a 54 y.o. female requesting sleep evaluation. She complains of snoring. She denies choking, her husband reports that patient does stop breathing for a short period of time.  If sleeping on back, will wake multiple times short of breath.  +non-restorative sleep.  Some morning headaches - not as severe as they have been previously.  Has not had a good night sleep in a very long time.  Has been out of Ambien - only takes 1-2 times per month  Results of the Epworth flowsheet 03/27/2019  Sitting and reading 1  Watching TV 1  Sitting, inactive in a public place (e.g. a theatre or a meeting) 0  As a passenger in a car for an hour without a break 1  Lying down to rest in the afternoon when circumstances permit 1  Sitting and talking to someone 0  Sitting quietly after a lunch without alcohol 0  In a car, while stopped for a few minutes in traffic 0  Total score 4    No Known Allergies   Current Outpatient Medications:  .  clotrimazole-betamethasone (LOTRISONE) cream, APPLY 1 APPLICATION TOPICALLY 2 (TWO) TIMES DAILY., Disp: 30 g, Rfl: 0 .   hydrochlorothiazide (HYDRODIURIL) 25 MG tablet, TAKE 1 TABLET DAILY, Disp: 90 tablet, Rfl: 2 .  naproxen (NAPROSYN) 500 MG tablet, Take 1 tablet (500 mg total) by mouth 2 (two) times daily as needed., Disp: 60 tablet, Rfl: 0 .  omeprazole (PRILOSEC) 20 MG capsule, TAKE 1 CAPSULE DAILY., Disp: 90 capsule, Rfl: 3 .  zolpidem (AMBIEN) 10 MG tablet, Take 1 tablet (10 mg total) by mouth at bedtime as needed. for sleep, Disp: 30 tablet, Rfl: 1  Review of Systems  Constitutional: Positive for activity change and fatigue.  Respiratory: Negative for choking and shortness of breath.   Cardiovascular: Negative for chest pain and palpitations.  Psychiatric/Behavioral: Negative for sleep disturbance.    Social History   Tobacco Use  . Smoking status: Never Smoker  . Smokeless tobacco: Never Used  Substance Use Topics  . Alcohol use: No      Objective:   Ht 5\' 5"  (1.651 m)   Wt 230 lb (104.3 kg)   BMI 38.27 kg/m  Vitals:   03/27/19 1127  Weight: 230 lb (104.3 kg)  Height: 5\' 5"  (1.651 m)  Body mass index is 38.27 kg/m.   Physical Exam Constitutional:      General: She is not in acute distress.    Appearance: Normal appearance. She is not diaphoretic.  HENT:     Head: Normocephalic and atraumatic.  Eyes:     General: No scleral icterus.  Conjunctiva/sclera: Conjunctivae normal.  Pulmonary:     Effort: Pulmonary effort is normal. No respiratory distress.  Skin:    General: Skin is warm and dry.     Findings: No rash.  Neurological:     Mental Status: She is alert and oriented to person, place, and time. Mental status is at baseline.  Psychiatric:        Mood and Affect: Mood normal.        Behavior: Behavior normal.      No results found for any visits on 03/27/19.     Assessment & Plan    I discussed the assessment and treatment plan with the patient. The patient was provided an opportunity to ask questions and all were answered. The patient agreed with the plan and  demonstrated an understanding of the instructions.   The patient was advised to call back or seek an in-person evaluation if the symptoms worsen or if the condition fails to improve as anticipated.  1. Snores 2. Non-restorative sleep 3. Insomnia, unspecified type - long-standing issue, but newly discussed diagnosis - concern for possible OSA - discussed risks of untreated OSA and benefits of CPAP therapy if needed - Epworth score above - can use Ambien sparingly, but not around time of sleep study - will refer for PSG - Ambulatory referral to Sleep Studies    Meds ordered this encounter  Medications  . clotrimazole-betamethasone (LOTRISONE) cream    Sig: Apply 1 application topically 2 (two) times daily.    Dispense:  30 g    Refill:  2  . zolpidem (AMBIEN) 10 MG tablet    Sig: Take 1 tablet (10 mg total) by mouth at bedtime as needed. for sleep    Dispense:  30 tablet    Refill:  1    This request is for a new prescription for a controlled substance as required by Federal/State law.REFILL REQUEST.     Follow-up as needed  The entirety of the information documented in the History of Present Illness, Review of Systems and Physical Exam were personally obtained by me. Portions of this information were initially documented by Lynford Humphrey, CMA and reviewed by me for thoroughness and accuracy.    Husein Guedes, Dionne Bucy, MD MPH [facility]

## 2019-04-02 ENCOUNTER — Encounter: Payer: Self-pay | Admitting: Family Medicine

## 2019-04-22 ENCOUNTER — Encounter: Payer: Self-pay | Admitting: Family Medicine

## 2019-04-22 NOTE — Telephone Encounter (Signed)
Pt's CPE rescheduled to 06/17/2019  Thanks,   -Vernona Rieger

## 2019-05-29 ENCOUNTER — Encounter: Payer: Managed Care, Other (non HMO) | Admitting: Family Medicine

## 2019-06-16 NOTE — Progress Notes (Signed)
Complete physical exam     [name]   DOB: [birth year]   54 y.o. Female  MRN: [id number] Visit Date: 06/17/2019  I,Sulibeya S Dimas,acting as a scribe for Shirlee Latch, MD.,have documented all relevant documentation on the behalf of Shirlee Latch, MD,as directed by  Shirlee Latch, MD while in the presence of Shirlee Latch, MD.  Today's healthcare provider: Shirlee Latch, MD  Subjective:    Chief Complaint  Patient presents with  . Annual Exam    Mackenzie Newman is a 54 y.o. female who presents today for a complete physical exam. Patient reports not sleeping well, does snore and is having non-restorative sleep. Patient is taking Melatonin 3 tablets at night.  HPI  09/20/2014 CPE 11/10/2014 Pap-negative 08/02/2017 Colonoscopy-non-bleeding internal hemorrhoids, melanotic mucosa in the cecum. Negative for dysplasia and malignancy.  Past Medical History:  Diagnosis Date  . Obesity    Past Surgical History:  Procedure Laterality Date  . CESAREAN SECTION    . COLONOSCOPY WITH PROPOFOL N/A 08/02/2017   Procedure: COLONOSCOPY WITH PROPOFOL;  Surgeon: Pasty Spillers, MD;  Location: [facility] ENDOSCOPY;  Service: Endoscopy;  Laterality: N/A;   Social History   Socioeconomic History  . Marital status: Married    Spouse name: Not on file  . Number of children: Not on file  . Years of education: Not on file  . Highest education level: Not on file  Occupational History  . Not on file  Tobacco Use  . Smoking status: Never Smoker  . Smokeless tobacco: Never Used  Substance and Sexual Activity  . Alcohol use: No  . Drug use: No  . Sexual activity: Yes  Other Topics Concern  . Not on file  Social History Narrative  . Not on file   Social Determinants of Health   Financial Resource Strain:   . Difficulty of Paying Living Expenses:   Food Insecurity:   . Worried About Programme researcher, broadcasting/film/video in the Last Year:   . Barista in the Last  Year:   Transportation Needs:   . Freight forwarder (Medical):   [name] Lack of Transportation (Non-Medical):   Physical Activity:   . Days of Exercise per Week:   . Minutes of Exercise per Session:   Stress:   . Feeling of Stress :   Social Connections:   . Frequency of Communication with Friends and Family:   . Frequency of Social Gatherings with Friends and Family:   . Attends Religious Services:   . Active Member of Clubs or Organizations:   . Attends Banker Meetings:   [name] Marital Status:   Intimate Partner Violence:   . Fear of Current or Ex-Partner:   . Emotionally Abused:   [name] Physically Abused:   . Sexually Abused:    No family status information on file.   Family History  Adopted: Yes  Family history unknown: Yes   No Known Allergies  Patient Care Team: Erasmo Downer, MD as PCP - General (Family Medicine)   Medications: Outpatient Medications Prior to Visit  Medication Sig  . clotrimazole-betamethasone (LOTRISONE) cream Apply 1 application topically 2 (two) times daily.  . naproxen (NAPROSYN) 500 MG tablet Take 1 tablet (500 mg total) by mouth 2 (two) times daily as needed.  [name] omeprazole (PRILOSEC) 20 MG capsule TAKE 1 CAPSULE DAILY.  [name] zolpidem (AMBIEN) 10 MG tablet Take 1 tablet (10 mg total) by mouth at bedtime as needed. for sleep  . [  DISCONTINUED] hydrochlorothiazide (HYDRODIURIL) 25 MG tablet TAKE 1 TABLET DAILY   No facility-administered medications prior to visit.    Review of Systems  Constitutional: Negative.   HENT: Negative.   Respiratory: Negative.   Cardiovascular: Negative.   Gastrointestinal: Negative.   Endocrine: Negative.   Genitourinary: Negative.   Musculoskeletal: Negative.   Skin: Negative.   Allergic/Immunologic: Negative.   Neurological: Negative.   Hematological: Negative.   Psychiatric/Behavioral: Negative.     Last CBC Lab Results  Component Value Date   WBC 9.5 05/01/2017   HGB 13.3 05/01/2017    HCT 39.0 05/01/2017   MCV 84 05/01/2017   MCH 28.5 05/01/2017   RDW 13.4 05/01/2017   PLT 315 05/01/2017   Last metabolic panel Lab Results  Component Value Date   GLUCOSE 108 (H) 05/01/2017   NA 144 05/01/2017   K 4.0 05/01/2017   CL 104 05/01/2017   CO2 26 05/01/2017   BUN 21 05/01/2017   CREATININE 0.86 05/01/2017   GFRNONAA 79 05/01/2017   GFRAA 91 05/01/2017   CALCIUM 9.5 05/01/2017   PROT 7.1 05/01/2017   ALBUMIN 4.2 05/01/2017   LABGLOB 2.9 05/01/2017   AGRATIO 1.4 05/01/2017   BILITOT 0.3 05/01/2017   ALKPHOS 94 05/01/2017   AST 22 05/01/2017   ALT 28 05/01/2017   Last lipids Lab Results  Component Value Date   CHOL 193 05/01/2017   HDL 60 05/01/2017   LDLCALC 119 (H) 05/01/2017   TRIG 68 05/01/2017   CHOLHDL 3.2 05/01/2017   Last thyroid functions Lab Results  Component Value Date   TSH 4.030 04/10/2016        Objective:    BP 90/62 (BP Location: Left Arm, Patient Position: Sitting, Cuff Size: Large)   Pulse 80   Temp (!) 96.9 F (36.1 C) (Temporal)   Resp 16   Ht 5' 5.5" (1.664 m)   Wt 234 lb (106.1 kg)   LMP 05/04/2019 (Approximate)   BMI 38.35 kg/m    Physical Exam Vitals reviewed. Exam conducted with a chaperone present.  Constitutional:      General: She is not in acute distress.    Appearance: Normal appearance. She is well-developed. She is not diaphoretic.  HENT:     Head: Normocephalic and atraumatic.     Right Ear: Tympanic membrane, ear canal and external ear normal.     Left Ear: Tympanic membrane, ear canal and external ear normal.  Eyes:     General: No scleral icterus.    Conjunctiva/sclera: Conjunctivae normal.     Pupils: Pupils are equal, round, and reactive to light.  Neck:     Thyroid: No thyromegaly.  Cardiovascular:     Rate and Rhythm: Normal rate and regular rhythm.     Heart sounds: Normal heart sounds. No murmur.  Pulmonary:     Effort: Pulmonary effort is normal. No respiratory distress.     Breath  sounds: Normal breath sounds. No wheezing or rales.  Chest:     Breasts:        Right: Normal.        Left: Normal.  Abdominal:     General: There is no distension.     Palpations: Abdomen is soft.     Tenderness: There is no abdominal tenderness. There is no guarding or rebound.  Genitourinary:    General: Normal vulva.     Exam position: Supine.     Vagina: Normal.     Cervix: Normal.  Rectum: Normal.     Comments: Breasts: breasts appear normal, no suspicious masses, no skin or nipple changes or axillary nodes.  GYN:  External genitalia within normal limits.  Vaginal mucosa pink, moist, normal rugae.  Nonfriable cervix without lesions, no discharge or bleeding noted on speculum exam.  Bimanual exam revealed normal, nongravid uterus.  No cervical motion tenderness. No adnexal masses bilaterally.    Musculoskeletal:        General: No deformity.     Cervical back: Neck supple.     Right lower leg: No edema.     Left lower leg: No edema.  Lymphadenopathy:     Cervical: No cervical adenopathy.  Skin:    General: Skin is warm and dry.     Findings: No rash.  Neurological:     Mental Status: She is alert and oriented to person, place, and time. Mental status is at baseline.  Psychiatric:        Mood and Affect: Mood normal.        Behavior: Behavior normal.        Thought Content: Thought content normal.      Depression Screen  PHQ 2/9 Scores 06/17/2019 03/27/2019 06/11/2017  PHQ - 2 Score 0 0 0  PHQ- 9 Score 4 1 -    No results found for any visits on 06/17/19.    Assessment & Plan:    Routine Health Maintenance and Physical Exam  Exercise Activities and Dietary recommendations Goals   None     Immunization History  Administered Date(s) Administered  . Tdap 09/20/2014    Health Maintenance  Topic Date Due  . HIV Screening  Never done  . MAMMOGRAM  Never done  . PAP SMEAR-Modifier  11/09/2017  . INFLUENZA VACCINE  10/04/2019  . TETANUS/TDAP  09/19/2024    . COLONOSCOPY  08/03/2027    Discussed health benefits of physical activity, and encouraged her to engage in regular exercise appropriate for her age and condition.      Problem List Items Addressed This Visit      Cardiovascular and Mediastinum   Essential (primary) hypertension    Low today Decrease HTCZ to 12.5 mg daily, new prescription sent Check CMP and screening lipid panel      Relevant Medications   hydrochlorothiazide (HYDRODIURIL) 12.5 MG tablet   Other Relevant Orders   CBC with Differential/Platelet   Comprehensive metabolic panel     Other   Morbid obesity (HCC)    Discussed importance of healthy weight management Discussed diet and exercise       Insomnia    Needs sleep study to r/o OSA given snoring and non-restorative sleep with nighttime awakenings      Relevant Orders   Home sleep test   Hyperlipidemia    Not on statin medication Check lipid panel      Relevant Medications   hydrochlorothiazide (HYDRODIURIL) 12.5 MG tablet   Other Relevant Orders   Lipid Panel With LDL/HDL Ratio   Hyperglycemia    Check lab      Relevant Orders   Hemoglobin A1c    Other Visit Diagnoses    Annual physical exam    -  Primary   Relevant Orders   TSH   Encounter for screening mammogram for malignant neoplasm of breast       Relevant Orders   MM Digital Diagnostic Bilat   Cervical cancer screening       Relevant Orders   Pap IG and HPV (  high risk) DNA detection   Screening for HIV (human immunodeficiency virus)       Relevant Orders   HIV Antibody (routine testing w rflx)   Snores       Relevant Orders   Home sleep test   Non-restorative sleep       Relevant Orders   Home sleep test      Return in about 6 months (around 12/17/2019) for follow up in 6 months for chronic issues.   I, Lavon Paganini, MD, have reviewed all documentation for this visit. The documentation on 06/17/19 for the exam, diagnosis, procedures, and orders are all  accurate and complete.   Rickita Forstner, Dionne Bucy, MD, MPH [facility]

## 2019-06-16 NOTE — Patient Instructions (Addendum)
Preventive Care 40-53 Years Old, Female Preventive care refers to visits with your health care provider and lifestyle choices that can promote health and wellness. This includes:  A yearly physical exam. This may also be called an annual well check.  Regular dental visits and eye exams.  Immunizations.  Screening for certain conditions.  Healthy lifestyle choices, such as eating a healthy diet, getting regular exercise, not using drugs or products that contain nicotine and tobacco, and limiting alcohol use. What can I expect for my preventive care visit? Physical exam Your health care provider will check your:  Height and weight. This may be used to calculate body mass index (BMI), which tells if you are at a healthy weight.  Heart rate and blood pressure.  Skin for abnormal spots. Counseling Your health care provider may ask you questions about your:  Alcohol, tobacco, and drug use.  Emotional well-being.  Home and relationship well-being.  Sexual activity.  Eating habits.  Work and work environment.  Method of birth control.  Menstrual cycle.  Pregnancy history. What immunizations do I need?  Influenza (flu) vaccine  This is recommended every year. Tetanus, diphtheria, and pertussis (Tdap) vaccine  You may need a Td booster every 10 years. Varicella (chickenpox) vaccine  You may need this if you have not been vaccinated. Zoster (shingles) vaccine  You may need this after age 60. Measles, mumps, and rubella (MMR) vaccine  You may need at least one dose of MMR if you were born in 1957 or later. You may also need a second dose. Pneumococcal conjugate (PCV13) vaccine  You may need this if you have certain conditions and were not previously vaccinated. Pneumococcal polysaccharide (PPSV23) vaccine  You may need one or two doses if you smoke cigarettes or if you have certain conditions. Meningococcal conjugate (MenACWY) vaccine  You may need this if you  have certain conditions. Hepatitis A vaccine  You may need this if you have certain conditions or if you travel or work in places where you may be exposed to hepatitis A. Hepatitis B vaccine  You may need this if you have certain conditions or if you travel or work in places where you may be exposed to hepatitis B. Haemophilus influenzae type b (Hib) vaccine  You may need this if you have certain conditions. Human papillomavirus (HPV) vaccine  If recommended by your health care provider, you may need three doses over 6 months. You may receive vaccines as individual doses or as more than one vaccine together in one shot (combination vaccines). Talk with your health care provider about the risks and benefits of combination vaccines. What tests do I need? Blood tests  Lipid and cholesterol levels. These may be checked every 5 years, or more frequently if you are over 50 years old.  Hepatitis C test.  Hepatitis B test. Screening  Lung cancer screening. You may have this screening every year starting at age 55 if you have a 30-pack-year history of smoking and currently smoke or have quit within the past 15 years.  Colorectal cancer screening. All adults should have this screening starting at age 50 and continuing until age 75. Your health care provider may recommend screening at age 45 if you are at increased risk. You will have tests every 1-10 years, depending on your results and the type of screening test.  Diabetes screening. This is done by checking your blood sugar (glucose) after you have not eaten for a while (fasting). You may have this   done every 1-3 years.  Mammogram. This may be done every 1-2 years. Talk with your health care provider about when you should start having regular mammograms. This may depend on whether you have a family history of breast cancer.  BRCA-related cancer screening. This may be done if you have a family history of breast, ovarian, tubal, or peritoneal  cancers.  Pelvic exam and Pap test. This may be done every 3 years starting at age 53. Starting at age 53, this may be done every 5 years if you have a Pap test in combination with an HPV test. Other tests  Sexually transmitted disease (STD) testing.  Bone density scan. This is done to screen for osteoporosis. You may have this scan if you are at high risk for osteoporosis. Follow these instructions at home: Eating and drinking  Eat a diet that includes fresh fruits and vegetables, whole grains, lean protein, and low-fat dairy.  Take vitamin and mineral supplements as recommended by your health care provider.  Do not drink alcohol if: ? Your health care provider tells you not to drink. ? You are pregnant, may be pregnant, or are planning to become pregnant.  If you drink alcohol: ? Limit how much you have to 0-1 drink a day. ? Be aware of how much alcohol is in your drink. In the [place] one drink equals one 12 oz bottle of beer (355 mL), one 5 oz glass of wine (148 mL), or one 1 oz glass of hard liquor (44 mL). Lifestyle  Take daily care of your teeth and gums.  Stay active. Exercise for at least 30 minutes on 5 or more days each week.  Do not use any products that contain nicotine or tobacco, such as cigarettes, e-cigarettes, and chewing tobacco. If you need help quitting, ask your health care provider.  If you are sexually active, practice safe sex. Use a condom or other form of birth control (contraception) in order to prevent pregnancy and STIs (sexually transmitted infections).  If told by your health care provider, take low-dose aspirin daily starting at age 53. What's next?  Visit your health care provider once a year for a well check visit.  Ask your health care provider how often you should have your eyes and teeth checked.  Stay up to date on all vaccines. This information is not intended to replace advice given to you by your health care provider. Make sure you  discuss any questions you have with your health care provider. Document Revised: 10/31/2017 Document Reviewed: 10/31/2017 Elsevier Patient Education  2020 [employer]vier Inc.  Preventive Care 58-78 Years Old, Female Preventive care refers to visits with your health care provider and lifestyle choices that can promote health and wellness. This includes:  A yearly physical exam. This may also be called an annual well check.  Regular dental visits and eye exams.  Immunizations.  Screening for certain conditions.  Healthy lifestyle choices, such as eating a healthy diet, getting regular exercise, not using drugs or products that contain nicotine and tobacco, and limiting alcohol use. What can I expect for my preventive care visit? Physical exam Your health care provider will check your:  Height and weight. This may be used to calculate body mass index (BMI), which tells if you are at a healthy weight.  Heart rate and blood pressure.  Skin for abnormal spots. Counseling Your health care provider may ask you questions about your:  Alcohol, tobacco, and drug use.  Emotional well-being.  Home and  relationship well-being.  Sexual activity.  Eating habits.  Work and work Statistician.  Method of birth control.  Menstrual cycle.  Pregnancy history. What immunizations do I need?  Influenza (flu) vaccine  This is recommended every year. Tetanus, diphtheria, and pertussis (Tdap) vaccine  You may need a Td booster every 10 years. Varicella (chickenpox) vaccine  You may need this if you have not been vaccinated. Zoster (shingles) vaccine  You may need this after age 67. Measles, mumps, and rubella (MMR) vaccine  You may need at least one dose of MMR if you were born in 1957 or later. You may also need a second dose. Pneumococcal conjugate (PCV13) vaccine  You may need this if you have certain conditions and were not previously vaccinated. Pneumococcal polysaccharide  (PPSV23) vaccine  You may need one or two doses if you smoke cigarettes or if you have certain conditions. Meningococcal conjugate (MenACWY) vaccine  You may need this if you have certain conditions. Hepatitis A vaccine  You may need this if you have certain conditions or if you travel or work in places where you may be exposed to hepatitis A. Hepatitis B vaccine  You may need this if you have certain conditions or if you travel or work in places where you may be exposed to hepatitis B. Haemophilus influenzae type b (Hib) vaccine  You may need this if you have certain conditions. Human papillomavirus (HPV) vaccine  If recommended by your health care provider, you may need three doses over 6 months. You may receive vaccines as individual doses or as more than one vaccine together in one shot (combination vaccines). Talk with your health care provider about the risks and benefits of combination vaccines. What tests do I need? Blood tests  Lipid and cholesterol levels. These may be checked every 5 years, or more frequently if you are over 4 years old.  Hepatitis C test.  Hepatitis B test. Screening  Lung cancer screening. You may have this screening every year starting at age 39 if you have a 30-pack-year history of smoking and currently smoke or have quit within the past 15 years.  Colorectal cancer screening. All adults should have this screening starting at age 65 and continuing until age 35. Your health care provider may recommend screening at age 67 if you are at increased risk. You will have tests every 1-10 years, depending on your results and the type of screening test.  Diabetes screening. This is done by checking your blood sugar (glucose) after you have not eaten for a while (fasting). You may have this done every 1-3 years.  Mammogram. This may be done every 1-2 years. Talk with your health care provider about when you should start having regular mammograms. This may  depend on whether you have a family history of breast cancer.  BRCA-related cancer screening. This may be done if you have a family history of breast, ovarian, tubal, or peritoneal cancers.  Pelvic exam and Pap test. This may be done every 3 years starting at age 57. Starting at age 46, this may be done every 5 years if you have a Pap test in combination with an HPV test. Other tests  Sexually transmitted disease (STD) testing.  Bone density scan. This is done to screen for osteoporosis. You may have this scan if you are at high risk for osteoporosis. Follow these instructions at home: Eating and drinking  Eat a diet that includes fresh fruits and vegetables, whole grains, lean protein, and  low-fat dairy.  Take vitamin and mineral supplements as recommended by your health care provider.  Do not drink alcohol if: ? Your health care provider tells you not to drink. ? You are pregnant, may be pregnant, or are planning to become pregnant.  If you drink alcohol: ? Limit how much you have to 0-1 drink a day. ? Be aware of how much alcohol is in your drink. In the [place] one drink equals one 12 oz bottle of beer (355 mL), one 5 oz glass of wine (148 mL), or one 1 oz glass of hard liquor (44 mL). Lifestyle  Take daily care of your teeth and gums.  Stay active. Exercise for at least 30 minutes on 5 or more days each week.  Do not use any products that contain nicotine or tobacco, such as cigarettes, e-cigarettes, and chewing tobacco. If you need help quitting, ask your health care provider.  If you are sexually active, practice safe sex. Use a condom or other form of birth control (contraception) in order to prevent pregnancy and STIs (sexually transmitted infections).  If told by your health care provider, take low-dose aspirin daily starting at age 52. What's next?  Visit your health care provider once a year for a well check visit.  Ask your health care provider how often you should  have your eyes and teeth checked.  Stay up to date on all vaccines. This information is not intended to replace advice given to you by your health care provider. Make sure you discuss any questions you have with your health care provider. Document Revised: 10/31/2017 Document Reviewed: 10/31/2017 Elsevier Patient Education  2020 [employer].

## 2019-06-17 ENCOUNTER — Ambulatory Visit (INDEPENDENT_AMBULATORY_CARE_PROVIDER_SITE_OTHER): Payer: Managed Care, Other (non HMO) | Admitting: Family Medicine

## 2019-06-17 ENCOUNTER — Other Ambulatory Visit: Payer: Self-pay

## 2019-06-17 ENCOUNTER — Encounter: Payer: Self-pay | Admitting: Family Medicine

## 2019-06-17 ENCOUNTER — Other Ambulatory Visit: Payer: Self-pay | Admitting: Family Medicine

## 2019-06-17 VITALS — BP 90/62 | HR 80 | Temp 96.9°F | Resp 16 | Ht 65.5 in | Wt 234.0 lb

## 2019-06-17 DIAGNOSIS — R739 Hyperglycemia, unspecified: Secondary | ICD-10-CM

## 2019-06-17 DIAGNOSIS — Z124 Encounter for screening for malignant neoplasm of cervix: Secondary | ICD-10-CM

## 2019-06-17 DIAGNOSIS — G4700 Insomnia, unspecified: Secondary | ICD-10-CM

## 2019-06-17 DIAGNOSIS — Z0000 Encounter for general adult medical examination without abnormal findings: Secondary | ICD-10-CM | POA: Diagnosis not present

## 2019-06-17 DIAGNOSIS — E7800 Pure hypercholesterolemia, unspecified: Secondary | ICD-10-CM

## 2019-06-17 DIAGNOSIS — Z114 Encounter for screening for human immunodeficiency virus [HIV]: Secondary | ICD-10-CM

## 2019-06-17 DIAGNOSIS — G478 Other sleep disorders: Secondary | ICD-10-CM

## 2019-06-17 DIAGNOSIS — Z1231 Encounter for screening mammogram for malignant neoplasm of breast: Secondary | ICD-10-CM

## 2019-06-17 DIAGNOSIS — R0683 Snoring: Secondary | ICD-10-CM

## 2019-06-17 DIAGNOSIS — I10 Essential (primary) hypertension: Secondary | ICD-10-CM | POA: Diagnosis not present

## 2019-06-17 MED ORDER — HYDROCHLOROTHIAZIDE 12.5 MG PO TABS: 12.5000 mg | ORAL_TABLET | Freq: Every day | ORAL | 3 refills | Status: DC

## 2019-06-17 NOTE — Assessment & Plan Note (Signed)
Check lab

## 2019-06-17 NOTE — Assessment & Plan Note (Signed)
Needs sleep study to r/o OSA given snoring and non-restorative sleep with nighttime awakenings

## 2019-06-17 NOTE — Assessment & Plan Note (Addendum)
Not on statin medication Check lipid panel

## 2019-06-17 NOTE — Assessment & Plan Note (Signed)
Discussed importance of healthy weight management Discussed diet and exercise  

## 2019-06-17 NOTE — Assessment & Plan Note (Addendum)
Low today Decrease HTCZ to 12.5 mg daily, new prescription sent Check CMP and screening lipid panel

## 2019-06-18 NOTE — Addendum Note (Signed)
Addended by: Hyacinth Meeker on: 06/18/2019 08:26 AM   Modules accepted: Orders

## 2019-06-22 ENCOUNTER — Telehealth: Payer: Self-pay

## 2019-06-22 LAB — IGP, APTIMA HPV, RFX 16/18,45
HPV Aptima: NEGATIVE
PAP Smear Comment: 0

## 2019-06-22 NOTE — Telephone Encounter (Signed)
Pt advised.   Thanks,   -Jehiel Koepp  

## 2019-06-22 NOTE — Telephone Encounter (Signed)
-----   Message from Erasmo Downer, MD sent at 06/22/2019 11:39 AM EDT ----- Normal pap smear. HPV negative. Repeat in 5 yrs

## 2019-06-24 ENCOUNTER — Telehealth: Payer: Self-pay

## 2019-06-24 LAB — COMPREHENSIVE METABOLIC PANEL
ALT: 36 IU/L — ABNORMAL HIGH (ref 0–32)
AST: 29 IU/L (ref 0–40)
Albumin/Globulin Ratio: 1.3 (ref 1.2–2.2)
Albumin: 3.9 g/dL (ref 3.8–4.9)
Alkaline Phosphatase: 81 IU/L (ref 39–117)
BUN/Creatinine Ratio: 14 (ref 9–23)
BUN: 14 mg/dL (ref 6–24)
Bilirubin Total: 0.7 mg/dL (ref 0.0–1.2)
CO2: 25 mmol/L (ref 20–29)
Calcium: 9.6 mg/dL (ref 8.7–10.2)
Chloride: 99 mmol/L (ref 96–106)
Creatinine, Ser: 0.98 mg/dL (ref 0.57–1.00)
GFR calc Af Amer: 77 mL/min/{1.73_m2} (ref >59)
GFR calc non Af Amer: 67 mL/min/{1.73_m2} (ref >59)
Globulin, Total: 3.1 g/dL (ref 1.5–4.5)
Glucose: 103 mg/dL — ABNORMAL HIGH (ref 65–99)
Potassium: 3.5 mmol/L (ref 3.5–5.2)
Sodium: 139 mmol/L (ref 134–144)
Total Protein: 7 g/dL (ref 6.0–8.5)

## 2019-06-24 LAB — CBC WITH DIFFERENTIAL/PLATELET
Basophils Absolute: 0 10*3/uL (ref 0.0–0.2)
Basos: 0 %
EOS (ABSOLUTE): 0.1 10*3/uL (ref 0.0–0.4)
Eos: 1 %
Hematocrit: 41.8 % (ref 34.0–46.6)
Hemoglobin: 14 g/dL (ref 11.1–15.9)
Immature Grans (Abs): 0 10*3/uL (ref 0.0–0.1)
Immature Granulocytes: 0 %
Lymphocytes Absolute: 1.8 10*3/uL (ref 0.7–3.1)
Lymphs: 20 %
MCH: 28.1 pg (ref 26.6–33.0)
MCHC: 33.5 g/dL (ref 31.5–35.7)
MCV: 84 fL (ref 79–97)
Monocytes Absolute: 0.6 10*3/uL (ref 0.1–0.9)
Monocytes: 7 %
Neutrophils Absolute: 6.6 10*3/uL (ref 1.4–7.0)
Neutrophils: 72 %
Platelets: 318 10*3/uL (ref 150–450)
RBC: 4.98 x10E6/uL (ref 3.77–5.28)
RDW: 12 % (ref 11.7–15.4)
WBC: 9.2 10*3/uL (ref 3.4–10.8)

## 2019-06-24 LAB — HIV ANTIBODY (ROUTINE TESTING W REFLEX): HIV Screen 4th Generation wRfx: NONREACTIVE

## 2019-06-24 LAB — TSH: TSH: 3.16 u[IU]/mL (ref 0.450–4.500)

## 2019-06-24 LAB — LIPID PANEL WITH LDL/HDL RATIO
Cholesterol, Total: 218 mg/dL — ABNORMAL HIGH (ref 100–199)
HDL: 59 mg/dL (ref >39)
LDL Chol Calc (NIH): 141 mg/dL — ABNORMAL HIGH (ref 0–99)
LDL/HDL Ratio: 2.4 ratio (ref 0.0–3.2)
Triglycerides: 104 mg/dL (ref 0–149)
VLDL Cholesterol Cal: 18 mg/dL (ref 5–40)

## 2019-06-24 LAB — PULMONARY FUNCTION TEST

## 2019-06-24 LAB — HEMOGLOBIN A1C
Est. average glucose Bld gHb Est-mCnc: 114 mg/dL
Hgb A1c MFr Bld: 5.6 % (ref 4.8–5.6)

## 2019-06-24 NOTE — Telephone Encounter (Signed)
Patient advised as below. Patient verbalizes understanding and is in agreement with treatment plan.  

## 2019-06-24 NOTE — Telephone Encounter (Signed)
-----   Message from Erasmo Downer, MD sent at 06/24/2019  1:35 PM EDT ----- Normal labs.  Cholesterol is high, but 10 year risk of heart disease/stroke is low at 1.3%. No medications needed, but recommend diet low in saturated fat and regular exercise - 30 min at least 5 times per week

## 2019-07-01 ENCOUNTER — Other Ambulatory Visit: Payer: Self-pay | Admitting: Family Medicine

## 2019-07-01 MED ORDER — NAPROXEN 500 MG PO TABS: 500.0000 mg | ORAL_TABLET | Freq: Two times a day (BID) | ORAL | 2 refills | Status: DC | PRN

## 2019-07-01 NOTE — Telephone Encounter (Signed)
Requested medication (s) are due for refill today:yes  Requested medication (s) are on the active medication list: yes  Last refill:  03/20/17  Future visit scheduled:yes  Notes to clinic: expired RX 03/21/18    Requested Prescriptions  Pending Prescriptions Disp Refills   naproxen (NAPROSYN) 500 MG tablet 60 tablet 0    Sig: Take 1 tablet (500 mg total) by mouth 2 (two) times daily as needed.      Analgesics:  NSAIDS Passed - 07/01/2019  9:52 AM      Passed - Cr in normal range and within 360 days    Creatinine, Ser  Date Value Ref Range Status  06/23/2019 0.98 0.57 - 1.00 mg/dL Final          Passed - HGB in normal range and within 360 days    Hemoglobin  Date Value Ref Range Status  06/23/2019 14.0 11.1 - 15.9 g/dL Final          Passed - Patient is not pregnant      Passed - Valid encounter within last 12 months    Recent Outpatient Visits           2 weeks ago Annual physical exam   [facility] [place], Marzella Schlein, MD   3 months ago Snores   [facility] [place], Marzella Schlein, MD   1 year ago URI, acute   [facility] [place], [place], [place]   1 year ago Other viral warts   [facility] [place], Marzella Schlein, MD   2 years ago Other viral warts   [facility] [place], Marzella Schlein, MD       Future Appointments             In 6 months Bacigalupo, Marzella Schlein, MD [facility], [facility]

## 2019-07-01 NOTE — Telephone Encounter (Signed)
naproxen (NAPROSYN) 500 MG tablet    Patient is requesting a refill 90 day supply.   Pharmacy:  [id number] [place], [place] - [address] AT [facility] [address] Phone:  [phone]  Fax:  [phone]

## 2019-07-01 NOTE — Telephone Encounter (Signed)
LOV  06/17/19  LRF  03/21/17  # 60

## 2019-07-03 ENCOUNTER — Telehealth: Payer: Self-pay

## 2019-07-03 NOTE — Telephone Encounter (Signed)
Pt returned call. I could not get a nurse on triage line with in a 10 minute time frame so I connected back with patient and she said she would try to call back on Monday.   I didn't want to keep her on the phone unless she wanted to continue to wait for a nurse to pick up.

## 2019-07-03 NOTE — Telephone Encounter (Signed)
LMTCB 07/03/2019.  [facility] please advise pt of sleep study results.   Sleep study confirms OSA.  Order sent to Cpap and supplies.     Thanks,   -Vernona Rieger

## 2019-07-06 NOTE — Telephone Encounter (Signed)
LMTCB 07/06/2019.  PEC Please advise pt of sleep study results.   Thanks,   -Vernona Rieger

## 2019-07-08 NOTE — Telephone Encounter (Signed)
Pt advised.   Thanks,   -Laura  

## 2019-07-10 ENCOUNTER — Other Ambulatory Visit: Payer: Self-pay | Admitting: Family Medicine

## 2019-07-10 DIAGNOSIS — K219 Gastro-esophageal reflux disease without esophagitis: Secondary | ICD-10-CM

## 2019-07-10 MED ORDER — OMEPRAZOLE 20 MG PO CPDR: 20.0000 mg | DELAYED_RELEASE_CAPSULE | Freq: Every day | ORAL | 3 refills | Status: DC

## 2019-07-10 NOTE — Telephone Encounter (Signed)
Requested Prescriptions  Pending Prescriptions Disp Refills  . omeprazole (PRILOSEC) 20 MG capsule 90 capsule 3    Sig: Take 1 capsule (20 mg total) by mouth daily.     Gastroenterology: Proton Pump Inhibitors Passed - 07/10/2019 11:55 AM      Passed - Valid encounter within last 12 months    Recent Outpatient Visits          3 weeks ago Annual physical exam   [facility] [place], Marzella Schlein, MD   3 months ago Snores   [facility], Marzella Schlein, MD   1 year ago URI, acute   [facility] [place], [place], [place]   1 year ago Other viral warts   [facility] [place], Marzella Schlein, MD   2 years ago Other viral warts   [facility] [place], Marzella Schlein, MD      Future Appointments            In 5 months Bacigalupo, Marzella Schlein, MD [facility], [facility]

## 2019-07-10 NOTE — Telephone Encounter (Signed)
omeprazole (PRILOSEC) 20 MG capsule    Patient requesting this medication be sent to new pharmacy: 90 day supply.    Pharmacy:  [facility] #[id number] [place], [place] - [address] AT [facility] [address] Phone:  [phone]  Fax:  [phone]

## 2019-07-21 ENCOUNTER — Telehealth: Payer: Self-pay | Admitting: Family Medicine

## 2019-07-21 NOTE — Telephone Encounter (Signed)
Copied from CRM [id number]. Topic: General - Other >> Jul 21, 2019  2:13 PM Tamela Oddi wrote: Reason for CRM: Patient would like the nurse to call her regarding her Cpap equipment since having her sleep study over 3 weeks ago.  She stated that she was told to call to find out.  Please call patient to discuss at [phone]

## 2019-07-22 ENCOUNTER — Telehealth: Payer: Self-pay

## 2019-07-22 NOTE — Telephone Encounter (Signed)
Copied from CRM [id number]. Topic: General - Inquiry >> Jul 22, 2019  2:40 PM Leary Roca wrote: Reason for CRM: Pt calling to hear back regarding getting a Cpack machine for sleep study please reach out to pt

## 2019-07-23 NOTE — Telephone Encounter (Signed)
[name], do you have an update on the CPAP machine?

## 2019-07-24 ENCOUNTER — Telehealth: Payer: Self-pay

## 2019-07-24 NOTE — Telephone Encounter (Signed)
Copied from CRM [id number]. Topic: General - Other >> Jul 24, 2019 12:40 PM Worthy Keeler D wrote: Reason for CRM: Representative called from [facility] stating the sleep study was denied by insurance.

## 2019-07-24 NOTE — Telephone Encounter (Signed)
Left message for Marcelino Duster from [facility]. Marcelino Duster left a message stating pt's insurance did not cover the sleep study.    Thanks,   -Vernona Rieger

## 2019-07-24 NOTE — Telephone Encounter (Signed)
Left message with Marcelino Duster from [facility] to send another copy of the sleep study results.   Thanks,   -Vernona Rieger

## 2019-07-31 ENCOUNTER — Ambulatory Visit
Admission: RE | Admit: 2019-07-31 | Discharge: 2019-07-31 | Disposition: A | Discharge status: Home / Self Care | Payer: Managed Care, Other (non HMO) | Source: Ambulatory Visit | Attending: Family Medicine | Admitting: Family Medicine

## 2019-07-31 DIAGNOSIS — Z1231 Encounter for screening mammogram for malignant neoplasm of breast: Secondary | ICD-10-CM

## 2019-08-06 ENCOUNTER — Other Ambulatory Visit: Payer: Self-pay | Admitting: Family Medicine

## 2019-08-06 DIAGNOSIS — R928 Other abnormal and inconclusive findings on diagnostic imaging of breast: Secondary | ICD-10-CM

## 2019-08-06 DIAGNOSIS — N6320 Unspecified lump in the left breast, unspecified quadrant: Secondary | ICD-10-CM

## 2019-08-10 ENCOUNTER — Encounter: Payer: Self-pay | Admitting: Family Medicine

## 2019-08-11 NOTE — Telephone Encounter (Signed)
I spoke with Chalmers Cater from [employer].  He is going to have his assistants check on this and get back with the pt.  I advised Ms. Minter.    Thanks,   -Vernona Rieger

## 2019-08-14 ENCOUNTER — Ambulatory Visit
Admission: RE | Admit: 2019-08-14 | Discharge: 2019-08-14 | Disposition: A | Discharge status: Home / Self Care | Payer: Managed Care, Other (non HMO) | Source: Ambulatory Visit | Attending: Family Medicine | Admitting: Family Medicine

## 2019-08-14 DIAGNOSIS — N6320 Unspecified lump in the left breast, unspecified quadrant: Secondary | ICD-10-CM | POA: Insufficient documentation

## 2019-08-14 DIAGNOSIS — R928 Other abnormal and inconclusive findings on diagnostic imaging of breast: Secondary | ICD-10-CM | POA: Diagnosis not present

## 2019-08-20 ENCOUNTER — Telehealth: Payer: Self-pay

## 2019-08-20 NOTE — Telephone Encounter (Signed)
Pt called in to follow up on previous message. Pt says that she have completed the sleep study and now just need to get her machine. Pt would like assistance with this. Pt says that she was receiving assistance from [place]. Pt is requesting a call back.     CB: [phone]

## 2019-08-20 NOTE — Telephone Encounter (Signed)
Copied from CRM [id number]. Topic: General - Inquiry >> Aug 19, 2019  4:34 PM Floria Raveling A wrote: Reason for CRM:  pt called and stated that she was to suppose to have someone come out for a CPAP and she has not heard anything from anyone.  She stated she is little frustrated because she has been tryin to get this done sine January  Best number [phone]

## 2019-08-21 NOTE — Telephone Encounter (Signed)
Please review, I do not have information on this. KW

## 2019-08-21 NOTE — Telephone Encounter (Signed)
Pt with Fayrene Fearing at [facility], he is going to check on this and call me back.   Thanks,   -Vernona Rieger

## 2019-10-07 LAB — BASIC METABOLIC PANEL
Creatinine: 1.2 — AB (ref 0.5–1.1)
Glucose: 104

## 2019-10-07 LAB — HEMOGLOBIN A1C: Hemoglobin A1C: 5.6

## 2019-10-07 LAB — LIPID PANEL
Cholesterol: 227 — AB (ref 0–200)
HDL: 59 (ref 35–70)
LDL Cholesterol: 153
Triglycerides: 83 (ref 40–160)

## 2019-10-07 LAB — COMPREHENSIVE METABOLIC PANEL: GFR calc non Af Amer: 63

## 2019-10-20 ENCOUNTER — Encounter: Payer: Self-pay | Admitting: Family Medicine

## 2019-10-21 NOTE — Telephone Encounter (Signed)
Please let her know that form is ready and abstract labs

## 2019-10-23 ENCOUNTER — Encounter: Payer: Self-pay | Admitting: Family Medicine

## 2019-12-31 ENCOUNTER — Ambulatory Visit: Payer: Managed Care, Other (non HMO) | Admitting: Family Medicine

## 2019-12-31 ENCOUNTER — Encounter: Payer: Self-pay | Admitting: Family Medicine

## 2019-12-31 VITALS — BP 131/88 | HR 64 | Temp 98.7°F | Resp 16 | Wt 228.0 lb

## 2019-12-31 DIAGNOSIS — I10 Essential (primary) hypertension: Secondary | ICD-10-CM | POA: Diagnosis not present

## 2019-12-31 DIAGNOSIS — R739 Hyperglycemia, unspecified: Secondary | ICD-10-CM

## 2019-12-31 DIAGNOSIS — E7800 Pure hypercholesterolemia, unspecified: Secondary | ICD-10-CM

## 2019-12-31 DIAGNOSIS — G4733 Obstructive sleep apnea (adult) (pediatric): Secondary | ICD-10-CM | POA: Diagnosis not present

## 2019-12-31 MED ORDER — HYDROCHLOROTHIAZIDE 25 MG PO TABS: 25.0000 mg | ORAL_TABLET | Freq: Every day | ORAL | 3 refills | Status: DC

## 2019-12-31 NOTE — Assessment & Plan Note (Signed)
Blood pressure at goal, but can lower further She feels like she was doing better on HCTZ 25 mg daily, so we will increase back to that dose Recheck metabolic panel Follow-up in 6 months

## 2019-12-31 NOTE — Assessment & Plan Note (Signed)
Congratulated on recent weight loss Discussed importance of healthy weight management Discussed diet and exercise  

## 2019-12-31 NOTE — Assessment & Plan Note (Signed)
Reviewed recent A1c that is well controlled at 5.6 Continue low-carb diet

## 2019-12-31 NOTE — Progress Notes (Signed)
Established patient visit   [name]   DOB: Aug 29, 1965   54 y.o. Female  MRN: [id number] Visit Date: 12/31/2019  Today's healthcare provider: Shirlee Latch, MD   Chief Complaint  Patient presents with  . Hypertension  . Sleep Apnea  . Hyperglycemia   Subjective    HPI  Hypertension, follow-up  BP Readings from Last 3 Encounters:  12/31/19 131/88  06/17/19 90/62  03/27/19 133/76   Wt Readings from Last 3 Encounters:  12/31/19 228 lb (103.4 kg)  06/17/19 234 lb (106.1 kg)  03/27/19 230 lb (104.3 kg)     She was last seen for hypertension 6 months ago.  BP at that visit was 90/62. Management since that visit includes decreasing HTCZ to 12.5 mg daily.  She reports good compliance with treatment. She is not having side effects.  She is following a Regular diet. She is not exercising. She does not smoke.  Use of agents associated with hypertension: none.   Outside blood pressures are not checked. Symptoms: No chest pain No chest pressure  No palpitations No syncope  No dyspnea No orthopnea  No paroxysmal nocturnal dyspnea No lower extremity edema   Pertinent labs: Lab Results  Component Value Date   CHOL 227 (A) 10/07/2019   HDL 59 10/07/2019   LDLCALC 153 10/07/2019   TRIG 83 10/07/2019   CHOLHDL 3.2 05/01/2017   Lab Results  Component Value Date   NA 139 06/23/2019   K 3.5 06/23/2019   CREATININE 1.2 (A) 10/07/2019   GFRNONAA 63 10/07/2019   GFRAA 77 06/23/2019   GLUCOSE 103 (H) 06/23/2019     The 10-year ASCVD risk score Denman George DC Jr., et al., 2013) is: 2.3%* (Cholesterol units were assumed)   ---------------------------------------------------------------------------------------------------  Follow up for OSA:  The patient was last seen for this 6 months ago. During that visit, patient presented with complaints of snoring and insomnia. Home sleep study was ordered. Sleep study confirmed OSA.  Order sent to [facility]  supplies.    She reports good compliance with treatment. She feels that condition is Improved. She is not having side effects.   -----------------------------------------------------------------------------------------  Follow up for Hyperglycemia:  The patient was last seen for this 6 months ago. Changes made at last visit include none.  She reports good compliance with treatment. She feels that condition is Unchanged. She is not having side effects.   -----------------------------------------------------------------------------------------  Has lost 12 lbs. Working on calorie reduction and low carb and portion sizes.  Social History   Tobacco Use  . Smoking status: Never Smoker  . Smokeless tobacco: Never Used  Vaping Use  . Vaping Use: Never used  Substance Use Topics  . Alcohol use: No  . Drug use: No       Medications: Outpatient Medications Prior to Visit  Medication Sig  . clotrimazole-betamethasone (LOTRISONE) cream Apply 1 application topically 2 (two) times daily.  . naproxen (NAPROSYN) 500 MG tablet Take 1 tablet (500 mg total) by mouth 2 (two) times daily as needed.  [name] omeprazole (PRILOSEC) 20 MG capsule Take 1 capsule (20 mg total) by mouth daily.  [name] zolpidem (AMBIEN) 10 MG tablet Take 1 tablet (10 mg total) by mouth at bedtime as needed. for sleep  . [DISCONTINUED] hydrochlorothiazide (HYDRODIURIL) 12.5 MG tablet Take 1 tablet (12.5 mg total) by mouth daily.   No facility-administered medications prior to visit.    Review of Systems  Constitutional: Negative for appetite change, chills, fatigue and  fever.  Respiratory: Negative for chest tightness and shortness of breath.   Cardiovascular: Negative for chest pain and palpitations.  Gastrointestinal: Negative for abdominal pain, nausea and vomiting.  Neurological: Negative for dizziness and weakness.       Objective    BP 131/88 (BP Location: Left Arm, Patient Position: Sitting, Cuff Size: Large)    Pulse 64   Temp 98.7 F (37.1 C) (Oral)   Resp 16   Wt 228 lb (103.4 kg)   BMI 37.36 kg/m  BP Readings from Last 3 Encounters:  12/31/19 131/88  06/17/19 90/62  03/27/19 133/76   Wt Readings from Last 3 Encounters:  12/31/19 228 lb (103.4 kg)  06/17/19 234 lb (106.1 kg)  03/27/19 230 lb (104.3 kg)      Physical Exam Vitals reviewed.  Constitutional:      General: She is not in acute distress.    Appearance: Normal appearance. She is well-developed. She is not diaphoretic.  HENT:     Head: Normocephalic and atraumatic.  Eyes:     General: No scleral icterus.    Conjunctiva/sclera: Conjunctivae normal.  Neck:     Thyroid: No thyromegaly.  Cardiovascular:     Rate and Rhythm: Normal rate and regular rhythm.     Pulses: Normal pulses.     Heart sounds: Normal heart sounds. No murmur heard.   Pulmonary:     Effort: Pulmonary effort is normal. No respiratory distress.     Breath sounds: Normal breath sounds. No wheezing, rhonchi or rales.  Musculoskeletal:     Cervical back: Neck supple.     Right lower leg: No edema.     Left lower leg: No edema.  Lymphadenopathy:     Cervical: No cervical adenopathy.  Skin:    General: Skin is warm and dry.     Findings: No rash.  Neurological:     Mental Status: She is alert and oriented to person, place, and time. Mental status is at baseline.  Psychiatric:        Mood and Affect: Mood normal.        Behavior: Behavior normal.       No results found for any visits on 12/31/19.  Assessment & Plan     Problem List Items Addressed This Visit      Cardiovascular and Mediastinum   Essential (primary) hypertension - Primary    Blood pressure at goal, but can lower further She feels like she was doing better on HCTZ 25 mg daily, so we will increase back to that dose Recheck metabolic panel Follow-up in 6 months      Relevant Medications   hydrochlorothiazide (HYDRODIURIL) 25 MG tablet   Other Relevant Orders    Comprehensive metabolic panel     Respiratory   Apnea, sleep    Well-controlled Better sleep quality Less daytime fatigue Tolerating CPAP well-we will continue        Other   Morbid obesity (HCC)    Congratulated on recent weight loss Discussed importance of healthy weight management Discussed diet and exercise       Hyperlipidemia    Reviewed recent lipid panel with ASCVD 10 yr risk 2.8% No statin needed at this time      Relevant Medications   hydrochlorothiazide (HYDRODIURIL) 25 MG tablet   Other Relevant Orders   Comprehensive metabolic panel   Hyperglycemia    Reviewed recent A1c that is well controlled at 5.6 Continue low-carb diet  Return in about 6 months (around 06/30/2020) for CPE.      I, Shirlee Latch, MD, have reviewed all documentation for this visit. The documentation on 12/31/19 for the exam, diagnosis, procedures, and orders are all accurate and complete.   Rebeka Kimble, Marzella Schlein, MD, MPH [facility]

## 2019-12-31 NOTE — Assessment & Plan Note (Signed)
Reviewed recent lipid panel with ASCVD 10 yr risk 2.8% No statin needed at this time

## 2019-12-31 NOTE — Assessment & Plan Note (Signed)
Well-controlled Better sleep quality Less daytime fatigue Tolerating CPAP well-we will continue

## 2020-01-01 ENCOUNTER — Ambulatory Visit: Payer: Self-pay | Admitting: Family Medicine

## 2020-01-07 LAB — COMPREHENSIVE METABOLIC PANEL
ALT: 44 IU/L — ABNORMAL HIGH (ref 0–32)
AST: 31 IU/L (ref 0–40)
Albumin/Globulin Ratio: 1.6 (ref 1.2–2.2)
Albumin: 4.5 g/dL (ref 3.8–4.9)
Alkaline Phosphatase: 91 IU/L (ref 44–121)
BUN/Creatinine Ratio: 17 (ref 9–23)
BUN: 19 mg/dL (ref 6–24)
Bilirubin Total: 0.7 mg/dL (ref 0.0–1.2)
CO2: 27 mmol/L (ref 20–29)
Calcium: 10.3 mg/dL — ABNORMAL HIGH (ref 8.7–10.2)
Chloride: 98 mmol/L (ref 96–106)
Creatinine, Ser: 1.15 mg/dL — ABNORMAL HIGH (ref 0.57–1.00)
GFR calc Af Amer: 63 mL/min/{1.73_m2} (ref >59)
GFR calc non Af Amer: 54 mL/min/{1.73_m2} — ABNORMAL LOW (ref >59)
Globulin, Total: 2.9 g/dL (ref 1.5–4.5)
Glucose: 117 mg/dL — ABNORMAL HIGH (ref 65–99)
Potassium: 3.2 mmol/L — ABNORMAL LOW (ref 3.5–5.2)
Sodium: 140 mmol/L (ref 134–144)
Total Protein: 7.4 g/dL (ref 6.0–8.5)

## 2020-02-11 ENCOUNTER — Ambulatory Visit (INDEPENDENT_AMBULATORY_CARE_PROVIDER_SITE_OTHER): Payer: Managed Care, Other (non HMO) | Admitting: Family Medicine

## 2020-02-11 ENCOUNTER — Other Ambulatory Visit: Payer: Self-pay

## 2020-02-11 DIAGNOSIS — Z23 Encounter for immunization: Secondary | ICD-10-CM

## 2020-03-01 ENCOUNTER — Other Ambulatory Visit: Payer: Self-pay | Admitting: Internal Medicine

## 2020-03-01 ENCOUNTER — Ambulatory Visit: Payer: Managed Care, Other (non HMO) | Attending: Internal Medicine

## 2020-03-01 DIAGNOSIS — Z23 Encounter for immunization: Secondary | ICD-10-CM

## 2020-03-01 NOTE — Progress Notes (Signed)
   Covid-19 Vaccination Clinic  Name:  Mackenzie Newman    MRN: [id number] DOB: 01/23/1966  03/01/2020  Mackenzie Newman was observed post Covid-19 immunization for 15 minutes without incident. She was provided with Vaccine Information Sheet and instruction to access the V-Safe system.   Mackenzie Newman was instructed to call 911 with any severe reactions post vaccine: [name] Difficulty breathing  . Swelling of face and throat  . A fast heartbeat  . A bad rash all over body  . Dizziness and weakness   Immunizations Administered    Name Date Dose VIS Date Route   Pfizer COVID-19 Vaccine 03/01/2020 12:43 PM 0.3 mL 12/23/2019 Intramuscular   Manufacturer: [employer], [employer]   Lot: [id number]   NDC: [address]-8

## 2020-03-10 ENCOUNTER — Other Ambulatory Visit: Payer: Self-pay | Admitting: Family Medicine

## 2020-03-10 DIAGNOSIS — N6489 Other specified disorders of breast: Secondary | ICD-10-CM

## 2020-05-31 NOTE — Progress Notes (Signed)
This encounter was created in error - please disregard.

## 2020-06-24 ENCOUNTER — Encounter: Payer: Self-pay | Admitting: Family Medicine

## 2020-06-30 ENCOUNTER — Telehealth: Payer: Self-pay | Admitting: Family Medicine

## 2020-06-30 DIAGNOSIS — K219 Gastro-esophageal reflux disease without esophagitis: Secondary | ICD-10-CM

## 2020-06-30 MED ORDER — OMEPRAZOLE 20 MG PO CPDR: 20.0000 mg | DELAYED_RELEASE_CAPSULE | Freq: Every day | ORAL | 1 refills | Status: DC

## 2020-06-30 NOTE — Telephone Encounter (Signed)
[facility] faxed refill request for the following medications:  omeprazole (PRILOSEC) 20 MG capsule  Last Rx: 07/10/19 90 day supply with 3 refills LOV: 12/31/19 Please advise. Thanks TNP

## 2020-09-19 ENCOUNTER — Ambulatory Visit (INDEPENDENT_AMBULATORY_CARE_PROVIDER_SITE_OTHER): Payer: Managed Care, Other (non HMO) | Admitting: Family Medicine

## 2020-09-19 ENCOUNTER — Other Ambulatory Visit: Payer: Self-pay

## 2020-09-19 ENCOUNTER — Encounter: Payer: Self-pay | Admitting: Family Medicine

## 2020-09-19 VITALS — Temp 98.3°F | Ht 65.5 in | Wt 239.4 lb

## 2020-09-19 DIAGNOSIS — Z0000 Encounter for general adult medical examination without abnormal findings: Secondary | ICD-10-CM | POA: Diagnosis not present

## 2020-09-19 DIAGNOSIS — R7303 Prediabetes: Secondary | ICD-10-CM | POA: Diagnosis not present

## 2020-09-19 DIAGNOSIS — Z1231 Encounter for screening mammogram for malignant neoplasm of breast: Secondary | ICD-10-CM | POA: Diagnosis not present

## 2020-09-19 DIAGNOSIS — E7800 Pure hypercholesterolemia, unspecified: Secondary | ICD-10-CM

## 2020-09-19 DIAGNOSIS — F5101 Primary insomnia: Secondary | ICD-10-CM

## 2020-09-19 DIAGNOSIS — I10 Essential (primary) hypertension: Secondary | ICD-10-CM | POA: Diagnosis not present

## 2020-09-19 DIAGNOSIS — R41840 Attention and concentration deficit: Secondary | ICD-10-CM

## 2020-09-19 NOTE — Assessment & Plan Note (Signed)
Last A1c 5.9 recently Low carb diet

## 2020-09-19 NOTE — Assessment & Plan Note (Signed)
BMI>35 and associated with HTN and HLD Discussed importance of healthy weight management Discussed diet and exercise

## 2020-09-19 NOTE — Progress Notes (Signed)
Annual Wellness Visit     [name], Female    DOB: 16-Jul-1965, 55 y.o.   MRN: [id number] Visit Date: 09/19/2020  Today's Provider: Shirlee Latch, MD   Chief Complaint  Patient presents with   Annual Exam    Subjective    Mackenzie Newman is a 55 y.o. female who presents today for her Annual Wellness Visit. She reports consuming a general diet.  Water aerobics 2xs a week  She generally feels well. She reports sleeping well. She does have additional problems to discuss today. She has concerns of ADHD.  HPI  Inability to Focus/ Amnesia  The patient states that she has been experiencing an inability to focus while working or doing daily activities. She also states that she is unable to remember the names of people.   Depression/anxiety She states that she is experiencing depression and anxiety symptoms due to her husbands health and imposter syndrome on her job.    Patient Active Problem List   Diagnosis Date Noted   Special screening for malignant neoplasms, colon    Melanosis of colon    External hemorrhoids    Internal hemorrhoids    Diverticulosis of large intestine without diverticulitis    Hyperlipidemia 05/02/2017   Hyperglycemia 05/02/2017   Insomnia 10/17/2016   Verruca vulgaris 10/17/2016   Acid reflux 08/28/2014   Essential (primary) hypertension 08/28/2014   Morbid obesity (HCC) 08/28/2014   Apnea, sleep 08/28/2014   Past Medical History:  Diagnosis Date   Obesity    No Known Allergies     Medications: Outpatient Medications Prior to Visit  Medication Sig   clotrimazole-betamethasone (LOTRISONE) cream Apply 1 application topically 2 (two) times daily.   COVID-19 mRNA vaccine, Pfizer, 30 MCG/0.3ML injection USE AS DIRECTED   hydrochlorothiazide (HYDRODIURIL) 25 MG tablet Take 1 tablet (25 mg total) by mouth daily.   naproxen (NAPROSYN) 500 MG tablet Take 1 tablet (500 mg total) by mouth 2 (two) times daily as needed.   omeprazole  (PRILOSEC) 20 MG capsule Take 1 capsule (20 mg total) by mouth daily.   zolpidem (AMBIEN) 10 MG tablet Take 1 tablet (10 mg total) by mouth at bedtime as needed. for sleep   No facility-administered medications prior to visit.    No Known Allergies  Patient Care Team: Erasmo Downer, MD as PCP - General (Family Medicine)  Review of Systems  Constitutional:  Negative for chills, fatigue and fever.  HENT:  Negative for congestion, ear pain, rhinorrhea, sinus pain and sore throat.   Respiratory:  Negative for cough, shortness of breath and wheezing.   Cardiovascular:  Negative for chest pain and leg swelling.  Gastrointestinal:  Negative for abdominal pain, blood in stool, diarrhea, nausea and vomiting.  Genitourinary:  Negative for dysuria, flank pain, frequency and urgency.  Neurological:  Negative for dizziness and headaches.  Psychiatric/Behavioral:  Positive for decreased concentration.   All other systems reviewed and are negative.       Objective    Vitals: There were no vitals taken for this visit. BP Readings from Last 3 Encounters:  12/31/19 131/88  06/17/19 90/62  03/27/19 133/76    Wt Readings from Last 3 Encounters:  12/31/19 228 lb (103.4 kg)  06/17/19 234 lb (106.1 kg)  03/27/19 230 lb (104.3 kg)   Physical Exam Constitutional:      General: She is not in acute distress.    Appearance: Normal appearance. She is not ill-appearing.  HENT:  Head: Normocephalic and atraumatic.  Eyes:     General: No scleral icterus.    Conjunctiva/sclera: Conjunctivae normal.  Cardiovascular:     Rate and Rhythm: Normal rate and regular rhythm.     Pulses: Normal pulses.     Heart sounds: Normal heart sounds. No murmur heard. Pulmonary:     Effort: Pulmonary effort is normal. No respiratory distress.     Breath sounds: Normal breath sounds. No wheezing.  Abdominal:     General: There is no distension.     Palpations: Abdomen is soft.     Tenderness: There is no  abdominal tenderness.  Musculoskeletal:     Cervical back: Neck supple.     Right lower leg: No edema.     Left lower leg: No edema.  Lymphadenopathy:     Cervical: No cervical adenopathy.  Skin:    General: Skin is warm and dry.     Capillary Refill: Capillary refill takes less than 2 seconds.     Findings: No rash.  Neurological:     Mental Status: She is alert and oriented to person, place, and time. Mental status is at baseline.  Psychiatric:        Mood and Affect: Mood normal.        Behavior: Behavior normal.    Fall Risk  09/19/2020 06/17/2019 03/27/2019 06/11/2017  Falls in the past year? 0 0 0 No  Number falls in past yr: 0 0 0 -  Injury with Fall? 0 0 0 -  Risk for fall due to : No Fall Risks No Fall Risks No Fall Risks -  Follow up - Falls evaluation completed Falls evaluation completed -    Depression screen [facility] 2/9 09/19/2020 06/17/2019 03/27/2019 06/11/2017 04/19/2017  Decreased Interest 0 0 0 0 0  Down, Depressed, Hopeless 0 0 0 0 0  PHQ - 2 Score 0 0 0 0 0  Altered sleeping 0 2 0 - -  Tired, decreased energy 0 2 1 - -  Change in appetite 0 0 0 - -  Feeling bad or failure about yourself  0 0 0 - -  Trouble concentrating 0 0 0 - -  Moving slowly or fidgety/restless 0 0 0 - -  Suicidal thoughts 0 0 0 - -  PHQ-9 Score 0 4 1 - -  Difficult doing work/chores - Not difficult at all Not difficult at all - -    Most recent cognitive screening: No flowsheet data found.  Flowsheet Row Office Visit from 09/19/2020 in [facility]  AUDIT-C Score 1        No results found for any visits on 09/19/20.  Assessment & Plan     Annual wellness visit done today including the all of the following: Reviewed patient's Family Medical History Reviewed and updated list of patient's medical providers Assessment of cognitive impairment was done Assessed patient's functional ability Established a written schedule for health screening services Health Risk Assessent  Completed and Reviewed  Exercise Activities and Dietary recommendations  Goals   None     Immunization History  Administered Date(s) Administered   Influenza,inj,Quad PF,6+ Mos 02/11/2020   PFIZER(Purple Top)SARS-COV-2 Vaccination 05/19/2019, 06/09/2019, 03/01/2020   Tdap 09/20/2014    Health Maintenance  Topic Date Due   Hepatitis C Screening  Never done   Zoster Vaccines- Shingrix (1 of 2) Never done   COVID-19 Vaccine (4 - Booster for Pfizer series) 06/30/2020   INFLUENZA VACCINE  10/03/2020   MAMMOGRAM  07/30/2021   PAP SMEAR-Modifier  06/18/2022   TETANUS/TDAP  09/19/2024   COLONOSCOPY (Pts 45-76yrs Insurance coverage will need to be confirmed)  08/03/2027   HIV Screening  Completed   Pneumococcal Vaccine 45-69 Years old  Aged Out   HPV VACCINES  Aged Out     Discussed health benefits of physical activity, and encouraged her to engage in regular exercise appropriate for her age and condition.    Problem List Items Addressed This Visit       Cardiovascular and Mediastinum   Essential (primary) hypertension    Well controlled Continue current medications Recheck metabolic panel F/u in 6 months        Relevant Orders   Comprehensive metabolic panel     Other   Morbid obesity (HCC)    BMI>35 and associated with HTN and HLD Discussed importance of healthy weight management Discussed diet and exercise        Insomnia    Only taking ambien rarely - it is helpful       Hyperlipidemia    Reviewed last lipid panel Not currently on a statin Recheck FLP and CMP Discussed diet and exercise        Relevant Orders   Comprehensive metabolic panel   Prediabetes    Last A1c 5.9 recently Low carb diet       Attention and concentration deficit    Longstanding issue Recently exacerbated Likely some component of anxiety and stress, but concern for possible ADHD Will send to Neuropsych for testing and possible treatment       Relevant Orders    Ambulatory referral to Neuropsychology   Other Visit Diagnoses     Encounter for annual physical exam    -  Primary   Relevant Orders   Comprehensive metabolic panel   Screening mammogram for breast cancer       Relevant Orders   MM 3D SCREEN BREAST BILATERAL          Return in about 6 months (around 03/22/2021) for chronic disease f/u.     Danelle Earthly [place],acting as a Neurosurgeon for Shirlee Latch, MD.,have documented all relevant documentation on the behalf of Shirlee Latch, MD,as directed by  Shirlee Latch, MD while in the presence of Shirlee Latch, MD.  I, Shirlee Latch, MD, have reviewed all documentation for this visit. The documentation on 09/19/20 for the exam, diagnosis, procedures, and orders are all accurate and complete.   Iva Posten, Marzella Schlein, MD, MPH [facility]

## 2020-09-19 NOTE — Assessment & Plan Note (Signed)
Longstanding issue Recently exacerbated Likely some component of anxiety and stress, but concern for possible ADHD Will send to Neuropsych for testing and possible treatment

## 2020-09-19 NOTE — Assessment & Plan Note (Signed)
Well controlled Continue current medications Recheck metabolic panel F/u in 6 months  

## 2020-09-19 NOTE — Assessment & Plan Note (Signed)
Only taking ambien rarely - it is helpful

## 2020-09-19 NOTE — Assessment & Plan Note (Signed)
Reviewed last lipid panel Not currently on a statin Recheck FLP and CMP Discussed diet and exercise  

## 2020-09-29 ENCOUNTER — Other Ambulatory Visit: Payer: Self-pay

## 2020-09-29 ENCOUNTER — Encounter: Payer: Self-pay | Admitting: Family Medicine

## 2020-09-29 ENCOUNTER — Ambulatory Visit (INDEPENDENT_AMBULATORY_CARE_PROVIDER_SITE_OTHER): Payer: Managed Care, Other (non HMO) | Admitting: Family Medicine

## 2020-09-29 DIAGNOSIS — Z23 Encounter for immunization: Secondary | ICD-10-CM | POA: Diagnosis not present

## 2020-09-29 NOTE — Progress Notes (Signed)
Patient here for Shingrix vaccination only.  I did not examine the patient.  I did review his medical history, medications, and allergies and vaccine consent form.  CMA gave vaccination. Patient tolerated well.  Erasmo Downer, MD, MPH [facility] 09/29/2020 8:50 AM

## 2020-11-01 ENCOUNTER — Encounter: Payer: Self-pay | Admitting: Psychology

## 2020-12-02 ENCOUNTER — Other Ambulatory Visit: Payer: Self-pay

## 2020-12-02 ENCOUNTER — Ambulatory Visit (INDEPENDENT_AMBULATORY_CARE_PROVIDER_SITE_OTHER): Payer: Managed Care, Other (non HMO) | Admitting: Family Medicine

## 2020-12-02 ENCOUNTER — Encounter: Payer: Self-pay | Admitting: Family Medicine

## 2020-12-02 DIAGNOSIS — Z23 Encounter for immunization: Secondary | ICD-10-CM

## 2020-12-02 NOTE — Progress Notes (Signed)
Patient here for Shingrix vaccination only.  I did not examine the patient.  I did review his medical history, medications, and allergies and vaccine consent form.  CMA gave vaccination. Patient tolerated well.  Erasmo Downer, MD, MPH [facility] 12/02/2020 10:31 AM

## 2020-12-27 ENCOUNTER — Other Ambulatory Visit: Payer: Self-pay | Admitting: Family Medicine

## 2020-12-27 DIAGNOSIS — K219 Gastro-esophageal reflux disease without esophagitis: Secondary | ICD-10-CM

## 2020-12-27 NOTE — Telephone Encounter (Signed)
Requested Prescriptions  Pending Prescriptions Disp Refills  . omeprazole (PRILOSEC) 20 MG capsule [Pharmacy Med Name: OMEPRAZOLE 20MG  CAPSULES] 90 capsule 0    Sig: TAKE 1 CAPSULE(20 MG) BY MOUTH DAILY     Gastroenterology: Proton Pump Inhibitors Failed - 12/27/2020  6:20 AM      Failed - Valid encounter within last 12 months    Recent Outpatient Visits          3 weeks ago Need for shingles vaccine   [facility] [place], [place], [place]   2 months ago Need for shingles vaccine   [facility], [facility], MD   3 months ago Encounter for annual physical exam   [facility], [facility], MD   12 months ago Essential (primary) hypertension   Marzella Schlein, [facility], MD   1 year ago Annual physical exam   [facility] [place], [facility], MD      Future Appointments            In 1 month [place], Marzella Schlein, PsyD [facility], [facility]

## 2021-02-09 ENCOUNTER — Encounter: Payer: Managed Care, Other (non HMO) | Admitting: Psychology

## 2021-03-30 ENCOUNTER — Telehealth: Payer: Self-pay | Admitting: Family Medicine

## 2021-03-30 DIAGNOSIS — I10 Essential (primary) hypertension: Secondary | ICD-10-CM

## 2021-03-30 DIAGNOSIS — K219 Gastro-esophageal reflux disease without esophagitis: Secondary | ICD-10-CM

## 2021-03-30 MED ORDER — OMEPRAZOLE 20 MG PO CPDR: 20.0000 mg | DELAYED_RELEASE_CAPSULE | Freq: Every day | ORAL | 0 refills | Status: DC

## 2021-03-30 MED ORDER — HYDROCHLOROTHIAZIDE 25 MG PO TABS: 25.0000 mg | ORAL_TABLET | Freq: Every day | ORAL | 0 refills | Status: DC

## 2021-03-30 NOTE — Telephone Encounter (Signed)
[facility] faxed refill request for the following medications:  hydrochlorothiazide (HYDRODIURIL) 25 MG tablet   omeprazole (PRILOSEC) 20 MG capsule   Please advise.

## 2021-04-26 ENCOUNTER — Other Ambulatory Visit: Payer: Self-pay | Admitting: Family Medicine

## 2021-04-26 DIAGNOSIS — I10 Essential (primary) hypertension: Secondary | ICD-10-CM

## 2021-04-26 NOTE — Telephone Encounter (Signed)
Requested Prescriptions  Pending Prescriptions Disp Refills   hydrochlorothiazide (HYDRODIURIL) 25 MG tablet [Pharmacy Med Name: HYDROCHLOROTHIAZIDE 25MG  TABLETS] 30 tablet 2    Sig: TAKE 1 TABLET(25 MG) BY MOUTH DAILY     Cardiovascular: Diuretics - Thiazide Failed - 04/26/2021 10:12 AM      Failed - Cr in normal range and within 180 days    Creatinine, Ser  Date Value Ref Range Status  01/06/2020 1.15 (H) 0.57 - 1.00 mg/dL Final         Failed - K in normal range and within 180 days    Potassium  Date Value Ref Range Status  01/06/2020 3.2 (L) 3.5 - 5.2 mmol/L Final         Failed - Na in normal range and within 180 days    Sodium  Date Value Ref Range Status  01/06/2020 140 134 - 144 mmol/L Final         Failed - Valid encounter within last 6 months    Recent Outpatient Visits          4 months ago Need for shingles vaccine   [facility] [place], Dionne Bucy, MD   6 months ago Need for shingles vaccine   [facility], Dionne Bucy, MD   7 months ago Encounter for annual physical exam   [facility], Dionne Bucy, MD   1 year ago Essential (primary) hypertension   [facility] [place], Dionne Bucy, MD   1 year ago Annual physical exam   [place], Dionne Bucy, MD      Future Appointments            In 1 month [place], Dionne Bucy, MD [facility]           Passed - Last BP in normal range    BP Readings from Last 1 Encounters:  12/31/19 131/88

## 2021-05-12 ENCOUNTER — Telehealth: Payer: Self-pay | Admitting: Family Medicine

## 2021-05-12 DIAGNOSIS — K219 Gastro-esophageal reflux disease without esophagitis: Secondary | ICD-10-CM

## 2021-05-12 NOTE — Telephone Encounter (Signed)
Requested medication (s) are due for refill today:   Provider to review the 3 ? ?Requested medication (s) are on the active medication list:   Yes for all 3 ? ?Future visit scheduled:   Yes 06/22/2021 with Dr. Brita Romp ? ? ?Last ordered: Omeprazole 03/30/2021 #30, 0 refill;   Naproxen 07/01/2019 #60, 2 refills;  Ambien 03/27/2019 #30, 1 refill  -  non delegated ? ?Returned for provider to review for refills prior to upcoming appt.    ? ?Requested Prescriptions  ?Pending Prescriptions Disp Refills  ? naproxen (NAPROSYN) 500 MG tablet 60 tablet 2  ?  Sig: Take 1 tablet (500 mg total) by mouth 2 (two) times daily as needed.  ?  ? Analgesics:  NSAIDS Failed - 05/12/2021 10:33 AM  ?  ?  Failed - Manual Review: Labs are only required if the patient has taken medication for more than 8 weeks.  ?  ?  Failed - Cr in normal range and within 360 days  ?  Creatinine, Ser  ?Date Value Ref Range Status  ?01/06/2020 1.15 (H) 0.57 - 1.00 mg/dL Final  ?  ?  ?  ?  Failed - HGB in normal range and within 360 days  ?  Hemoglobin  ?Date Value Ref Range Status  ?06/23/2019 14.0 11.1 - 15.9 g/dL Final  ?  ?  ?  ?  Failed - PLT in normal range and within 360 days  ?  Platelets  ?Date Value Ref Range Status  ?06/23/2019 318 150 - 450 x10E3/uL Final  ?  ?  ?  ?  Failed - HCT in normal range and within 360 days  ?  Hematocrit  ?Date Value Ref Range Status  ?06/23/2019 41.8 34.0 - 46.6 % Final  ?  ?  ?  ?  Failed - eGFR is 30 or above and within 360 days  ?  GFR calc Af Amer  ?Date Value Ref Range Status  ?01/06/2020 63 >59 mL/min/1.73 Final  ?  Comment:  ?  **In accordance with recommendations from the NKF-ASN Task force,** ?  [facility] is in the process of updating its eGFR calculation to the ?  2021 CKD-EPI creatinine equation that estimates kidney function ?  without a race variable. ?  ? ?GFR calc non Af Amer  ?Date Value Ref Range Status  ?01/06/2020 54 (L) >59 mL/min/1.73 Final  ?  ?  ?  ?  Failed - Valid encounter within last 12 months  ?   Recent Outpatient Visits   ? ?      ? 5 months ago Need for shingles vaccine  ? [facility], Dionne Bucy, MD  ? 7 months ago Need for shingles vaccine  ? [facility], Dionne Bucy, MD  ? 7 months ago Encounter for annual physical exam  ? [facility], Dionne Bucy, MD  ? 1 year ago Essential (primary) hypertension  ? [facility], Dionne Bucy, MD  ? 1 year ago Annual physical exam  ? [facility], Dionne Bucy, MD  ? ?  ?  ?Future Appointments   ? ?        ? In 1 month Bacigalupo, Dionne Bucy, MD [facility]  ? ?  ? ?  ?  ?  Passed - Patient is not pregnant  ?  ?  ? omeprazole (PRILOSEC) 20 MG capsule 30 capsule 0  ?  Sig: Take 1 capsule (20 mg  total) by mouth daily. Please schedule an office visit before anymore refills.  ?  ? Gastroenterology: Proton Pump Inhibitors Failed - 05/12/2021 10:33 AM  ?  ?  Failed - Valid encounter within last 12 months  ?  Recent Outpatient Visits   ? ?      ? 5 months ago Need for shingles vaccine  ? [facility], Dionne Bucy, MD  ? 7 months ago Need for shingles vaccine  ? [facility], Dionne Bucy, MD  ? 7 months ago Encounter for annual physical exam  ? [facility], Dionne Bucy, MD  ? 1 year ago Essential (primary) hypertension  ? [facility], Dionne Bucy, MD  ? 1 year ago Annual physical exam  ? [facility], Dionne Bucy, MD  ? ?  ?  ?Future Appointments   ? ?        ? In 1 month Bacigalupo, Dionne Bucy, MD [name]  ? ?  ? ?  ?  ?  ? zolpidem (AMBIEN) 10 MG tablet 30 tablet 1  ?  Sig: Take 1 tablet (10 mg total) by mouth at bedtime as needed. for sleep  ?  ? Not Delegated - Psychiatry:  Anxiolytics/Hypnotics Failed - 05/12/2021 10:33 AM  ?  ?  Failed - This refill cannot be delegated  ?  ?  Failed - Urine Drug Screen completed  in last 360 days  ?  ?  Failed - Valid encounter within last 6 months  ?  Recent Outpatient Visits   ? ?      ? 5 months ago Need for shingles vaccine  ? [facility], Dionne Bucy, MD  ? 7 months ago Need for shingles vaccine  ? [facility], Dionne Bucy, MD  ? 7 months ago Encounter for annual physical exam  ? [facility], Dionne Bucy, MD  ? 1 year ago Essential (primary) hypertension  ? [facility]ig[facility], Dionne Bucy, MD  ? 1 year ago Annual physical exam  ? [facility], Dionne Bucy, MD  ? ?  ?  ?Future Appointments   ? ?        ? In 1 month Bacigalupo, Dionne Bucy, MD [facility]  ? ?  ? ?  ?  ?  ? ?

## 2021-05-12 NOTE — Telephone Encounter (Signed)
Medication Refill - Medication:  ?omeprazole (PRILOSEC) 20 MG capsule *needing a 30 day supply for insurance* ? ?zolpidem (AMBIEN) 10 MG tablet ? ?naproxen (NAPROSYN) 500 MG tablet  ? ?Has the patient contacted their pharmacy? Yes.   ?Contact PCP ? ?Preferred Pharmacy (with phone number or street name):  ?[facility] #[id number] Nicholes Rough, [place] - [address] AT [facility] OF [address]  ?[address] [place], [place] [address]  ?Phone:  [phone]  Fax:  [phone] ? ?Has the patient been seen for an appointment in the last year OR does the patient have an upcoming appointment? Yes.   ? ?Agent: Please be advised that RX refills may take up to 3 business days. We ask that you follow-up with your pharmacy. ?

## 2021-05-15 MED ORDER — OMEPRAZOLE 20 MG PO CPDR: 20.0000 mg | DELAYED_RELEASE_CAPSULE | Freq: Every day | ORAL | 0 refills | Status: DC

## 2021-05-15 MED ORDER — NAPROXEN 500 MG PO TABS: 500.0000 mg | ORAL_TABLET | Freq: Two times a day (BID) | ORAL | 2 refills | Status: DC | PRN

## 2021-05-23 NOTE — Telephone Encounter (Signed)
omeprazole (PRILOSEC) 20 MG capsule pt is extremely upset as she said she had requested 90 day fill on this per ins, not 30 and even 30 day was declined, Pt says her insurance will not pay for anything but 90 day and she made an appt as requested with Dr B for 4/20. I offered another appt with another provider sooner but pt states this is not her problem that she was told that as soon as she made the appt that 90 supply would be called in. FU with pt [phone] ?

## 2021-05-24 MED ORDER — OMEPRAZOLE 20 MG PO CPDR: 20.0000 mg | DELAYED_RELEASE_CAPSULE | Freq: Every day | ORAL | 0 refills | Status: DC

## 2021-05-24 NOTE — Telephone Encounter (Signed)
Patient has been seen within the last year, so she can have 90 day Rx of Omeprazole - ok to refill.  She is overdue for here HTN f/u appt though. ?

## 2021-05-24 NOTE — Addendum Note (Signed)
Addended by: Hyacinth Meeker on: 05/24/2021 10:33 AM ? ? Modules accepted: Orders ? ?

## 2021-05-24 NOTE — Telephone Encounter (Signed)
Patient advised prescription was sent

## 2021-06-21 NOTE — Progress Notes (Signed)
?  ? ?I,Mackenzie Newman,acting as a scribe for Mackenzie Latch, MD.,have documented all relevant documentation on the behalf of Mackenzie Latch, MD,as directed by  Mackenzie Latch, MD while in the presence of Mackenzie Latch, MD.  ? ?Established patient visit ? ? ?[name]   DOB: [birth year]   56 y.o. Female  MRN: [id number] ?Visit Date: 06/22/2021 ? ?Today's healthcare provider: Shirlee Latch, MD  ? ?Chief Complaint  ?Patient presents with  ? Follow-up  ? ?Subjective  ?  ?HPI  ?Hypertension, follow-up ? ?BP Readings from Last 3 Encounters:  ?06/22/21 130/70  ?12/31/19 131/88  ?06/17/19 90/62  ? Wt Readings from Last 3 Encounters:  ?06/22/21 252 lb 4.8 oz (114.4 kg)  ?09/19/20 239 lb 6.4 oz (108.6 kg)  ?12/31/19 228 lb (103.4 kg)  ?  ? ?She was last seen for hypertension 9 months ago.  ?BP at that visit was 131/88. Management since that visit includes no changes. ? ?She reports excellent compliance with treatment. ?She is not having side effects.  ?She is following a Regular diet. ?She is exercising. ?She does not smoke. ? ?Use of agents associated with hypertension: none.  ? ?Outside blood pressures are 120/80-140/90. ?Symptoms: ?No chest pain No chest pressure  ?No palpitations No syncope  ?No dyspnea No orthopnea  ?No paroxysmal nocturnal dyspnea No lower extremity edema  ? ?Pertinent labs ?Lab Results  ?Component Value Date  ? CHOL 227 (A) 10/07/2019  ? HDL 59 10/07/2019  ? LDLCALC 153 10/07/2019  ? TRIG 83 10/07/2019  ? CHOLHDL 3.2 05/01/2017  ? Lab Results  ?Component Value Date  ? NA 140 01/06/2020  ? K 3.2 (L) 01/06/2020  ? CREATININE 1.15 (H) 01/06/2020  ? GFRNONAA 54 (L) 01/06/2020  ? GLUCOSE 117 (H) 01/06/2020  ? TSH 3.160 06/23/2019  ?  ? ?The 10-year ASCVD risk score (Arnett DK, et al., 2019) is: 2.7%* (Cholesterol units were assumed) ? ?--------------------------------------------------------------------------------------------------- ? ?Prediabetes, Follow-up ? ?Lab Results   ?Component Value Date  ? HGBA1C 5.6 10/07/2019  ? HGBA1C 5.6 06/23/2019  ? GLUCOSE 117 (H) 01/06/2020  ? GLUCOSE 103 (H) 06/23/2019  ? GLUCOSE 108 (H) 05/01/2017  ? ? ?Last seen for for this9 months ago.  ?Management since that visit includes no changes. ?Current symptoms include none and have been stable. ? ?Prior visit with dietician: yes - couple of years ago at [facility] ?Current diet: well balanced ?Current exercise: walking ? ?Pertinent Labs: ?   ?Component Value Date/Time  ? CHOL 227 (A) 10/07/2019 0000  ? CHOL 218 (H) 06/23/2019 0803  ? TRIG 83 10/07/2019 0000  ? CHOLHDL 3.2 05/01/2017 0825  ? CREATININE 1.15 (H) 01/06/2020 0759  ? ? ?Wt Readings from Last 3 Encounters:  ?06/22/21 252 lb 4.8 oz (114.4 kg)  ?09/19/20 239 lb 6.4 oz (108.6 kg)  ?12/31/19 228 lb (103.4 kg)  ? ? ?----------------------------------------------------------------------------------------- ? ?Wants to talk about weight loss meds and options. ? ?Medications: ?Outpatient Medications Prior to Visit  ?Medication Sig  ? clotrimazole-betamethasone (LOTRISONE) cream Apply 1 application topically 2 (two) times daily.  ? hydrochlorothiazide (HYDRODIURIL) 25 MG tablet TAKE 1 TABLET(25 MG) BY MOUTH DAILY  ? naproxen (NAPROSYN) 500 MG tablet Take 1 tablet (500 mg total) by mouth 2 (two) times daily as needed.  ? omeprazole (PRILOSEC) 20 MG capsule Take 1 capsule (20 mg total) by mouth daily. Please schedule an office visit before anymore refills.  ? [DISCONTINUED] zolpidem (AMBIEN) 10 MG tablet Take 1 tablet (10 mg total)  by mouth at bedtime as needed. for sleep  ? ?No facility-administered medications prior to visit.  ? ? ?Review of Systems  ?Constitutional:  Negative for appetite change, chills, fatigue and fever.  ?Respiratory:  Negative for chest tightness and shortness of breath.   ?Cardiovascular:  Negative for chest pain and palpitations.  ?Gastrointestinal:  Negative for abdominal pain, nausea and vomiting.  ?Neurological:  Negative for  dizziness and weakness.  ? ? ?  Objective  ?  ?BP 130/70 (BP Location: Left Wrist, Patient Position: Sitting, Cuff Size: Normal)   Pulse 76   Temp 98.5 ?F (36.9 ?C) (Oral)   Resp 16   Wt 252 lb 4.8 oz (114.4 kg)   BMI 41.35 kg/m?  ? ? ?Physical Exam ?Vitals reviewed.  ?Constitutional:   ?   General: She is not in acute distress. ?   Appearance: Normal appearance. She is well-developed. She is not diaphoretic.  ?HENT:  ?   Head: Normocephalic and atraumatic.  ?Eyes:  ?   General: No scleral icterus. ?   Conjunctiva/sclera: Conjunctivae normal.  ?Neck:  ?   Thyroid: No thyromegaly.  ?Cardiovascular:  ?   Rate and Rhythm: Normal rate and regular rhythm.  ?   Pulses: Normal pulses.  ?   Heart sounds: Normal heart sounds. No murmur heard. ?Pulmonary:  ?   Effort: Pulmonary effort is normal. No respiratory distress.  ?   Breath sounds: Normal breath sounds. No wheezing, rhonchi or rales.  ?Musculoskeletal:  ?   Cervical back: Neck supple.  ?   Right lower leg: No edema.  ?   Left lower leg: No edema.  ?Lymphadenopathy:  ?   Cervical: No cervical adenopathy.  ?Skin: ?   General: Skin is warm and dry.  ?   Findings: No rash.  ?Neurological:  ?   Mental Status: She is alert and oriented to person, place, and time. Mental status is at baseline.  ?Psychiatric:     ?   Mood and Affect: Mood normal.     ?   Behavior: Behavior normal.  ?  ? ? ?No results found for any visits on 06/22/21. ? Assessment & Plan  ?  ? ?Problem List Items Addressed This Visit   ? ?  ? Cardiovascular and Mediastinum  ? Essential (primary) hypertension - Primary  ?  Well controlled ?Continue current medications ?Recheck metabolic panel ?F/u in 6 months  ?  ?  ? Relevant Orders  ? Comprehensive metabolic panel  ? Amb Ref to Medical Weight Management  ?  ? Respiratory  ? Apnea, sleep  ?  Well controlled ?Better sleep quality ?Less daytime fatigue ?Tolerating CPAP well  - continue  ?Does have R sided congestion - will ask [facility] about a mask fitting ? ?   ?  ?  ? Other  ? Morbid obesity (HCC)  ?  Discussed importance of healthy weight management ?Discussed diet and exercise  ?Discussed medical and surgical options ?Referral to healthy weight and wellness ?Will check on Wegovy coverage  ?If not covered, will go with generic contrave - wellbutrin XL 150 mg daily and naltrexone 25mg  daily ?  ?  ? Relevant Orders  ? Amb Ref to Medical Weight Management  ? Insomnia  ?  Taking ambien rarely ?Well controlled with CPAP mostly ? ?  ?  ? Hyperlipidemia  ?  Reviewed last lipid panel ?Not currently on a statin ?Recheck FLP and CMP ?Discussed diet and exercise  ?  ?  ?  Relevant Orders  ? Comprehensive metabolic panel  ? Lipid Panel With LDL/HDL Ratio  ? Amb Ref to Medical Weight Management  ? Prediabetes  ?  Recommend low carb diet ?Recheck A1c  ?  ?  ? Relevant Orders  ? Hemoglobin A1c  ? Amb Ref to Medical Weight Management  ? ?Other Visit Diagnoses   ? ? Need for hepatitis C screening test      ? Relevant Orders  ? Hepatitis C Antibody  ? ?  ?  ? ?Return in about 6 months (around 12/22/2021) for CPE.  ?   ? ?I, Mackenzie Latch, MD, have reviewed all documentation for this visit. The documentation on 06/22/21 for the exam, diagnosis, procedures, and orders are all accurate and complete. ? ? ?Erasmo Downer, MD, MPH ?[facility] ?[facility]   ?

## 2021-06-22 ENCOUNTER — Ambulatory Visit: Payer: Managed Care, Other (non HMO) | Admitting: Family Medicine

## 2021-06-22 ENCOUNTER — Encounter: Payer: Self-pay | Admitting: Family Medicine

## 2021-06-22 VITALS — BP 130/70 | HR 76 | Temp 98.5°F | Resp 16 | Wt 252.3 lb

## 2021-06-22 DIAGNOSIS — I10 Essential (primary) hypertension: Secondary | ICD-10-CM | POA: Diagnosis not present

## 2021-06-22 DIAGNOSIS — F5101 Primary insomnia: Secondary | ICD-10-CM

## 2021-06-22 DIAGNOSIS — E7800 Pure hypercholesterolemia, unspecified: Secondary | ICD-10-CM | POA: Diagnosis not present

## 2021-06-22 DIAGNOSIS — R7303 Prediabetes: Secondary | ICD-10-CM | POA: Diagnosis not present

## 2021-06-22 DIAGNOSIS — G4733 Obstructive sleep apnea (adult) (pediatric): Secondary | ICD-10-CM

## 2021-06-22 DIAGNOSIS — Z1159 Encounter for screening for other viral diseases: Secondary | ICD-10-CM

## 2021-06-22 MED ORDER — ZOLPIDEM TARTRATE 10 MG PO TABS: 10.0000 mg | ORAL_TABLET | Freq: Every evening | ORAL | 1 refills | Status: DC | PRN

## 2021-06-22 NOTE — Patient Instructions (Signed)
Check coverage for wegovy at [email or web address] ?

## 2021-06-22 NOTE — Assessment & Plan Note (Signed)
Taking ambien rarely ?Well controlled with CPAP mostly ?

## 2021-06-22 NOTE — Assessment & Plan Note (Signed)
Reviewed last lipid panel Not currently on a statin Recheck FLP and CMP Discussed diet and exercise  

## 2021-06-22 NOTE — Assessment & Plan Note (Signed)
Recommend low carb diet °Recheck A1c  °

## 2021-06-22 NOTE — Assessment & Plan Note (Signed)
Discussed importance of healthy weight management ?Discussed diet and exercise  ?Discussed medical and surgical options ?Referral to healthy weight and wellness ?Will check on Wegovy coverage  ?If not covered, will go with generic contrave - wellbutrin XL 150 mg daily and naltrexone 25mg  daily ?

## 2021-06-22 NOTE — Assessment & Plan Note (Signed)
Well controlled Continue current medications Recheck metabolic panel F/u in 6 months  

## 2021-06-22 NOTE — Assessment & Plan Note (Signed)
Well controlled ?Better sleep quality ?Less daytime fatigue ?Tolerating CPAP well  - continue  ?Does have R sided congestion - will ask [facility] about a mask fitting ?

## 2021-06-23 ENCOUNTER — Telehealth: Payer: Self-pay

## 2021-06-23 ENCOUNTER — Encounter: Payer: Self-pay | Admitting: Family Medicine

## 2021-06-23 LAB — COMPREHENSIVE METABOLIC PANEL
ALT: 53 IU/L — ABNORMAL HIGH (ref 0–32)
AST: 38 IU/L (ref 0–40)
Albumin/Globulin Ratio: 1.3 (ref 1.2–2.2)
Albumin: 4.2 g/dL (ref 3.8–4.9)
Alkaline Phosphatase: 93 IU/L (ref 44–121)
BUN/Creatinine Ratio: 19 (ref 9–23)
BUN: 22 mg/dL (ref 6–24)
Bilirubin Total: 0.6 mg/dL (ref 0.0–1.2)
CO2: 23 mmol/L (ref 20–29)
Calcium: 10.1 mg/dL (ref 8.7–10.2)
Chloride: 102 mmol/L (ref 96–106)
Creatinine, Ser: 1.13 mg/dL — ABNORMAL HIGH (ref 0.57–1.00)
Globulin, Total: 3.2 g/dL (ref 1.5–4.5)
Glucose: 116 mg/dL — ABNORMAL HIGH (ref 70–99)
Potassium: 3.6 mmol/L (ref 3.5–5.2)
Sodium: 140 mmol/L (ref 134–144)
Total Protein: 7.4 g/dL (ref 6.0–8.5)
eGFR: 58 mL/min/{1.73_m2} — ABNORMAL LOW (ref >59)

## 2021-06-23 LAB — LIPID PANEL WITH LDL/HDL RATIO
Cholesterol, Total: 202 mg/dL — ABNORMAL HIGH (ref 100–199)
HDL: 61 mg/dL (ref >39)
LDL Chol Calc (NIH): 122 mg/dL — ABNORMAL HIGH (ref 0–99)
LDL/HDL Ratio: 2 ratio (ref 0.0–3.2)
Triglycerides: 105 mg/dL (ref 0–149)
VLDL Cholesterol Cal: 19 mg/dL (ref 5–40)

## 2021-06-23 LAB — HEMOGLOBIN A1C
Est. average glucose Bld gHb Est-mCnc: 128 mg/dL
Hgb A1c MFr Bld: 6.1 % — ABNORMAL HIGH (ref 4.8–5.6)

## 2021-06-23 LAB — HEPATITIS C ANTIBODY: Hep C Virus Ab: NONREACTIVE

## 2021-06-23 NOTE — Telephone Encounter (Signed)
Source Subject Topic  ?Mackenzie Newman (Patient) Mackenzie Newman (Patient) General - Other  ?Reason for CRM: Patient called in to inform Dr B that her insurance will cover the Wagovy at 100% but states that she will need prior authorization. So per patient just need provider to send Rx to pharmacy, Any questions please call patient at Ph#  [phone]  ? ?

## 2021-06-28 NOTE — Telephone Encounter (Signed)
Pt called in wanting to get the status of the PA pt requesting a call back ,please advise.  ?

## 2021-06-29 ENCOUNTER — Other Ambulatory Visit: Payer: Self-pay | Admitting: Family Medicine

## 2021-06-29 MED ORDER — SEMAGLUTIDE-WEIGHT MANAGEMENT 0.25 MG/0.5ML ~~LOC~~ SOAJ: 0.2500 mg | SUBCUTANEOUS | 0 refills | Status: DC

## 2021-06-29 MED ORDER — SEMAGLUTIDE-WEIGHT MANAGEMENT 0.5 MG/0.5ML ~~LOC~~ SOAJ: 0.5000 mg | SUBCUTANEOUS | 0 refills | Status: DC

## 2021-06-30 NOTE — Telephone Encounter (Signed)
Patient called in to inquire of Prior Authorization that was to be done for her to get medication Wagovy Please advise and call patient at Ph# [phone] ?

## 2021-07-03 NOTE — Telephone Encounter (Signed)
Patient advised that PA was started this morning.  ?

## 2021-07-04 ENCOUNTER — Telehealth: Payer: Self-pay | Admitting: Family Medicine

## 2021-07-04 NOTE — Telephone Encounter (Signed)
Pt stated she picked up her medication, Semaglutide-Weight Management 0.25 MG/0.5ML SOAJ ? ?However, the label shows No refills/Doctor authorization required. Pt is asking for clarification on why it says no refills, and if she needs PA every time, she needs a refill. ? ?Pt stated she is unsure if this is a trial from her PCP to see how she does on medication. ? ?Pt is requesting a call back.  ? ?Please advise.  ?

## 2021-07-05 ENCOUNTER — Encounter: Payer: Self-pay | Admitting: Family Medicine

## 2021-07-30 ENCOUNTER — Encounter: Payer: Self-pay | Admitting: Family Medicine

## 2021-08-01 ENCOUNTER — Telehealth: Payer: Self-pay | Admitting: Family Medicine

## 2021-08-01 NOTE — Telephone Encounter (Signed)
Duplicate. Messaged forwarded to provider.

## 2021-08-01 NOTE — Telephone Encounter (Signed)
Pt states the Reginal Lutes is on a national back order and today is the day for her shot. Pt states she did [facility] Ozempic [facility] Pt et [employer] but trying to expedite. Please advise [id number] [place], [place] - [phone]

## 2021-08-03 MED ORDER — PHENTERMINE HCL 37.5 MG PO CAPS: 37.5000 mg | ORAL_CAPSULE | ORAL | 2 refills | Status: DC

## 2021-08-07 ENCOUNTER — Other Ambulatory Visit: Payer: Self-pay

## 2021-08-07 ENCOUNTER — Other Ambulatory Visit: Payer: Self-pay | Admitting: Family Medicine

## 2021-08-07 MED ORDER — PHENTERMINE HCL 37.5 MG PO CAPS: 37.5000 mg | ORAL_CAPSULE | ORAL | 2 refills | Status: DC

## 2021-08-07 NOTE — Telephone Encounter (Signed)
Medication Refill - Medication: phentermine 37.5 MG capsule   Pt called to report that [facility] does not have Rx in stock, However [facility] but they need a Rx first. Pt wants this called in today, please advise    Has the patient contacted their pharmacy? Yes.   (Agent: If no, request that the patient contact the pharmacy for the refill. If patient does not wish to contact the pharmacy document the reason why and proceed with request.) (Agent: If yes, when and what did the pharmacy advise?)  Preferred Pharmacy (with phone number or street name):  [facility] #[id number] Nicholes Rough, [place] - [address] AT [facility] OF [address]  [address] [place] [address]  Phone: [phone] Fax: [phone]   Has the patient been seen for an appointment in the last year OR does the patient have an upcoming appointment? Yes.    Agent: Please be advised that RX refills may take up to 3 business days. We ask that you follow-up with your pharmacy.

## 2021-08-07 NOTE — Telephone Encounter (Signed)
Requested medication (s) are due for refill today: no  Requested medication (s) are on the active medication list: yes  Last refill:  08/03/21 #30/2  Future visit scheduled: yes  Notes to clinic:  rx was sent to mail pharmacy but does not have in stock but local pharmacy does so needs to be resent.    Requested Prescriptions  Pending Prescriptions Disp Refills   phentermine 37.5 MG capsule 30 capsule 2    Sig: Take 1 capsule (37.5 mg total) by mouth every morning.     Not Delegated - Neurology: Anticonvulsants - Controlled - phentermine hydrochloride Failed - 08/07/2021 11:36 AM      Failed - This refill cannot be delegated      Failed - eGFR in normal range and within 360 days    GFR calc Af Amer  Date Value Ref Range Status  01/06/2020 63 >59 mL/min/1.73 Final    Comment:    **In accordance with recommendations from the NKF-ASN Task force,**   [facility] is in the process of updating its eGFR calculation to the   2021 CKD-EPI creatinine equation that estimates kidney function   without a race variable.    GFR calc non Af Amer  Date Value Ref Range Status  01/06/2020 54 (L) >59 mL/min/1.73 Final   eGFR  Date Value Ref Range Status  06/22/2021 58 (L) >59 mL/min/1.73 Final         Failed - Cr in normal range and within 360 days    Creatinine, Ser  Date Value Ref Range Status  06/22/2021 1.13 (H) 0.57 - 1.00 mg/dL Final         Passed - Last BP in normal range    BP Readings from Last 1 Encounters:  06/22/21 130/70         Passed - Valid encounter within last 6 months    Recent Outpatient Visits           1 month ago Essential (primary) hypertension   [facility], Dionne Bucy, MD   8 months ago Need for shingles vaccine   [facility], Dionne Bucy, [facility]   10 months ago Need for shingles vaccine   [facility], Dionne Bucy, [facility]   10 months ago Encounter for annual physical exam   [facility], Dionne Bucy, MD   1 year ago Essential (primary) hypertension   [facility]igal[facility], Dionne Bucy, MD       Future Appointments             In 4 months Bacigalupo, Dionne Bucy, MD [facility] [place] - Weight completed in the last 3 months    Wt Readings from Last 1 Encounters:  06/22/21 252 lb 4.8 oz (114.4 kg)

## 2021-08-07 NOTE — Telephone Encounter (Signed)
Was out of stock and needs to be sent to this pharmacy

## 2021-08-20 ENCOUNTER — Other Ambulatory Visit: Payer: Self-pay | Admitting: Family Medicine

## 2021-08-20 DIAGNOSIS — K219 Gastro-esophageal reflux disease without esophagitis: Secondary | ICD-10-CM

## 2021-09-13 ENCOUNTER — Other Ambulatory Visit: Payer: Self-pay | Admitting: Family Medicine

## 2021-09-13 DIAGNOSIS — I10 Essential (primary) hypertension: Secondary | ICD-10-CM

## 2021-09-15 MED ORDER — HYDROCHLOROTHIAZIDE 25 MG PO TABS: ORAL_TABLET | ORAL | 0 refills | Status: DC

## 2021-09-15 NOTE — Telephone Encounter (Signed)
Pt also wanted to let provider know that she stopped taking Semaglutide-Weight Management 0.5 MG/0.5ML SOAJ [[id number]] / due to her sleeping when taking this / she stated she will wait for the [facility] to come back in stock / please advise

## 2021-09-15 NOTE — Telephone Encounter (Signed)
Pt needs her hydrochlorothiazide (HYDRODIURIL) 25 MG tablet resent to pharmacy for a 90 day supply at once to satisfy insurance / please advise   [id number] Nicholes Rough,  - [address] AT [facility] OF [address]  [address] [place], [place] [address]  Phone:  [phone]  Fax:  [phone]      Pt also wanted to let provider know that she stopped taking Semaglutide-Weight Management 0.5 MG/0.5ML Ivory Broad [[id number]] / due to her sleeping when taking this / she stated she will wait for the [facility] to come back in stock / please advise

## 2021-09-15 NOTE — Addendum Note (Signed)
Addended by: Wilford Corner on: 09/15/2021 12:43 PM   Modules accepted: Orders

## 2021-09-15 NOTE — Telephone Encounter (Signed)
Requested Prescriptions  Pending Prescriptions Disp Refills  . hydrochlorothiazide (HYDRODIURIL) 25 MG tablet 90 tablet 0    Sig: TAKE 1 TABLET(25 MG) BY MOUTH DAILY     Cardiovascular: Diuretics - Thiazide Failed - 09/15/2021 12:43 PM      Failed - Cr in normal range and within 180 days    Creatinine, Ser  Date Value Ref Range Status  06/22/2021 1.13 (H) 0.57 - 1.00 mg/dL Final         Passed - K in normal range and within 180 days    Potassium  Date Value Ref Range Status  06/22/2021 3.6 3.5 - 5.2 mmol/L Final         Passed - Na in normal range and within 180 days    Sodium  Date Value Ref Range Status  06/22/2021 140 134 - 144 mmol/L Final         Passed - Last BP in normal range    BP Readings from Last 1 Encounters:  06/22/21 130/70         Passed - Valid encounter within last 6 months    Recent Outpatient Visits          2 months ago Essential (primary) hypertension   [facility], Marzella Schlein, MD   9 months ago Need for shingles vaccine   [facility], Marzella Schlein, MD   11 months ago Need for shingles vaccine   [facility], Marzella Schlein, MD   12 months ago Encounter for annual physical exam   [facility], Marzella Schlein, MD   1 year ago Essential (primary) hypertension   [facility], Marzella Schlein, MD      Future Appointments            In 3 months Bacigalupo, Marzella Schlein, MD [facility]           Refused Prescriptions Disp Refills  . hydrochlorothiazide (HYDRODIURIL) 12.5 MG tablet [Pharmacy Med Name: HYDROCHLOROTHIAZIDE 12.5MG  TABLETS] 90 tablet 3    Sig: TAKE 1 TABLET(12.5 MG) BY MOUTH DAILY     Cardiovascular: Diuretics - Thiazide Failed - 09/15/2021 12:43 PM      Failed - Cr in normal range and within 180 days    Creatinine, Ser  Date Value Ref Range Status  06/22/2021 1.13 (H) 0.57 - 1.00 mg/dL Final         Passed - K  in normal range and within 180 days    Potassium  Date Value Ref Range Status  06/22/2021 3.6 3.5 - 5.2 mmol/L Final         Passed - Na in normal range and within 180 days    Sodium  Date Value Ref Range Status  06/22/2021 140 134 - 144 mmol/L Final         Passed - Last BP in normal range    BP Readings from Last 1 Encounters:  06/22/21 130/70         Passed - Valid encounter within last 6 months    Recent Outpatient Visits          2 months ago Essential (primary) hypertension   [facility], Marzella Schlein, MD   9 months ago Need for shingles vaccine   [facility], Marzella Schlein, MD   11 months ago Need for shingles vaccine   [facility], Marzella Schlein, MD   12 months ago Encounter for  annual physical exam   [facility] [place], Marzella Schlein, MD   1 year ago Essential (primary) hypertension   [facility], Marzella Schlein, MD      Future Appointments            In 3 months Bacigalupo, Marzella Schlein, MD [facility]

## 2021-09-18 DIAGNOSIS — Z0289 Encounter for other administrative examinations: Secondary | ICD-10-CM

## 2021-09-26 ENCOUNTER — Other Ambulatory Visit: Payer: Self-pay | Admitting: Family Medicine

## 2021-09-27 NOTE — Telephone Encounter (Signed)
Requested medication (s) are due for refill today: yes  Requested medication (s) are on the active medication list: yes  Last refill:  05/15/21 #60 2 RF  Future visit scheduled: yes  Notes to clinic:  overdue lab work   Requested Prescriptions  Pending Prescriptions Disp Refills   naproxen (NAPROSYN) 500 MG tablet [Pharmacy Med Name: NAPROXEN 500MG TABLETS] 60 tablet 2    Sig: TAKE 1 TABLET(500 MG) BY MOUTH TWICE DAILY AS NEEDED     Analgesics:  NSAIDS Failed - 09/26/2021 10:03 AM      Failed - Manual Review: Labs are only required if the patient has taken medication for more than 8 weeks.      Failed - Cr in normal range and within 360 days    Creatinine, Ser  Date Value Ref Range Status  06/22/2021 1.13 (H) 0.57 - 1.00 mg/dL Final         Failed - HGB in normal range and within 360 days    Hemoglobin  Date Value Ref Range Status  06/23/2019 14.0 11.1 - 15.9 g/dL Final         Failed - PLT in normal range and within 360 days    Platelets  Date Value Ref Range Status  06/23/2019 318 150 - 450 x10E3/uL Final         Failed - HCT in normal range and within 360 days    Hematocrit  Date Value Ref Range Status  06/23/2019 41.8 34.0 - 46.6 % Final         Passed - eGFR is 30 or above and within 360 days    GFR calc Af Amer  Date Value Ref Range Status  01/06/2020 63 >59 mL/min/1.73 Final    Comment:    **In accordance with recommendations from the NKF-ASN Task force,**   [facility] is in the process of updating its eGFR calculation to the   2021 CKD-EPI creatinine equation that estimates kidney function   without a race variable.    GFR calc non Af Amer  Date Value Ref Range Status  01/06/2020 54 (L) >59 mL/min/1.73 Final   eGFR  Date Value Ref Range Status  06/22/2021 58 (L) >59 mL/min/1.73 Final         Passed - Patient is not pregnant      Passed - Valid encounter within last 12 months    Recent Outpatient Visits           3 months ago Essential (primary)  hypertension   [facility], Dionne Bucy, MD   9 months ago Need for shingles vaccine   [facility], Dionne Bucy, MD   12 months ago Need for shingles vaccine   [facility], Dionne Bucy, MD   1 year ago Encounter for annual physical exam   [facility] [place], Dionne Bucy, MD   1 year ago Essential (primary) hypertension   [facility], Dionne Bucy, MD       Future Appointments             In 2 months Bacigalupo, Dionne Bucy, MD [facility] [place]

## 2021-10-16 ENCOUNTER — Ambulatory Visit (INDEPENDENT_AMBULATORY_CARE_PROVIDER_SITE_OTHER): Payer: Managed Care, Other (non HMO) | Admitting: Family Medicine

## 2021-10-16 ENCOUNTER — Encounter (INDEPENDENT_AMBULATORY_CARE_PROVIDER_SITE_OTHER): Payer: Self-pay | Admitting: Family Medicine

## 2021-10-16 VITALS — BP 145/86 | HR 61 | Temp 97.8°F | Ht 63.0 in | Wt 249.0 lb

## 2021-10-16 DIAGNOSIS — R5383 Other fatigue: Secondary | ICD-10-CM

## 2021-10-16 DIAGNOSIS — G4733 Obstructive sleep apnea (adult) (pediatric): Secondary | ICD-10-CM | POA: Diagnosis not present

## 2021-10-16 DIAGNOSIS — Z6841 Body Mass Index (BMI) 40.0 and over, adult: Secondary | ICD-10-CM

## 2021-10-16 DIAGNOSIS — Z1331 Encounter for screening for depression: Secondary | ICD-10-CM

## 2021-10-16 DIAGNOSIS — Z9989 Dependence on other enabling machines and devices: Secondary | ICD-10-CM

## 2021-10-16 DIAGNOSIS — I10 Essential (primary) hypertension: Secondary | ICD-10-CM

## 2021-10-16 DIAGNOSIS — R7303 Prediabetes: Secondary | ICD-10-CM | POA: Diagnosis not present

## 2021-10-16 DIAGNOSIS — R0602 Shortness of breath: Secondary | ICD-10-CM | POA: Diagnosis not present

## 2021-10-16 DIAGNOSIS — N189 Chronic kidney disease, unspecified: Secondary | ICD-10-CM

## 2021-10-17 LAB — CBC WITH DIFFERENTIAL/PLATELET
Basophils Absolute: 0 10*3/uL (ref 0.0–0.2)
Basos: 0 %
EOS (ABSOLUTE): 0.3 10*3/uL (ref 0.0–0.4)
Eos: 4 %
Hematocrit: 40.1 % (ref 34.0–46.6)
Hemoglobin: 13.1 g/dL (ref 11.1–15.9)
Immature Grans (Abs): 0 10*3/uL (ref 0.0–0.1)
Immature Granulocytes: 0 %
Lymphocytes Absolute: 2.2 10*3/uL (ref 0.7–3.1)
Lymphs: 27 %
MCH: 28 pg (ref 26.6–33.0)
MCHC: 32.7 g/dL (ref 31.5–35.7)
MCV: 86 fL (ref 79–97)
Monocytes Absolute: 0.5 10*3/uL (ref 0.1–0.9)
Monocytes: 7 %
Neutrophils Absolute: 5 10*3/uL (ref 1.4–7.0)
Neutrophils: 62 %
Platelets: 285 10*3/uL (ref 150–450)
RBC: 4.68 x10E6/uL (ref 3.77–5.28)
RDW: 12.9 % (ref 11.7–15.4)
WBC: 8 10*3/uL (ref 3.4–10.8)

## 2021-10-17 LAB — TSH: TSH: 3.07 u[IU]/mL (ref 0.450–4.500)

## 2021-10-17 LAB — T4, FREE: Free T4: 1.09 ng/dL (ref 0.82–1.77)

## 2021-10-17 LAB — INSULIN, RANDOM: INSULIN: 25.2 u[IU]/mL — ABNORMAL HIGH (ref 2.6–24.9)

## 2021-10-17 LAB — VITAMIN D 25 HYDROXY (VIT D DEFICIENCY, FRACTURES): Vit D, 25-Hydroxy: 16.6 ng/mL — ABNORMAL LOW (ref 30.0–100.0)

## 2021-10-17 LAB — VITAMIN B12: Vitamin B-12: 379 pg/mL (ref 232–1245)

## 2021-10-26 NOTE — Progress Notes (Signed)
Chief Complaint:   OBESITY Mackenzie Newman (MR# [id number]) is a 56 y.o. female who presents for evaluation and treatment of obesity and related comorbidities. Current BMI is Body mass index is 44.11 kg/m. Mackenzie Newman has been struggling with her weight for many years and has been unsuccessful in either losing weight, maintaining weight loss, or reaching her healthy weight goal.  Mackenzie Newman has been overweight since childhood and grew up with disordered eating around her.  She is adopted, and she has a good support system and has done therapy.  She tried Wegovy 0.25 mg for 4 weeks and did well.  Stopped in May due to shortages.  Phentermine caused insomnia.  Her husband is going through health problems and cannot eat high-protein foods.  Emotionally eats comfort foods, doughnuts, and Oreos.  Mackenzie Newman is currently in the action stage of change and ready to dedicate time achieving and maintaining a healthier weight. Mackenzie Newman is interested in becoming our patient and working on intensive lifestyle modifications including (but not limited to) diet and exercise for weight loss.  Mackenzie Newman's habits were reviewed today and are as follows: Her family eats meals together, she thinks her family will eat healthier with her, her desired weight loss is 84-89 lbs, she has been heavy most of her life, she started gaining weight after pregnancy, her heaviest weight ever was 255 pounds, she is a picky eater and doesn't like to eat healthier foods, she has significant food cravings issues, she skips meals frequently, she is frequently drinking liquids with calories, she frequently makes poor food choices, she frequently eats larger portions than normal, and she struggles with emotional eating.  Depression Screen Mackenzie Newman's Food and Mood (modified PHQ-9) score was 15.     10/16/2021    7:21 AM  Depression screen PHQ 2/9  Decreased Interest 1  Down, Depressed, Hopeless 1  PHQ - 2 Score 2  Altered sleeping 3  Tired,  decreased energy 3  Change in appetite 3  Feeling bad or failure about yourself  3  Trouble concentrating 1  Moving slowly or fidgety/restless 0  Suicidal thoughts 0  PHQ-9 Score 15  Difficult doing work/chores Not difficult at all   Subjective:   1. Other fatigue Mackenzie Newman admits to daytime somnolence and denies waking up still tired. Patient has a history of symptoms of daytime fatigue. Mackenzie Newman generally gets 7 or 9 hours of sleep per night, and states that she has generally restful sleep. Snoring is present. Apneic episodes are present. Epworth Sleepiness Score is 5.   2. SOB (shortness of breath) on exertion Mackenzie Newman notes increasing shortness of breath with exercising and seems to be worsening over time with weight gain. She notes getting out of breath sooner with activity than she used to. This has not gotten worse recently. Mackenzie Newman denies shortness of breath at rest or orthopnea.  3. Prediabetes Mackenzie Newman's A1c was 6.1 on 06/22/2021.  She consumes excess carbohydrates and sugar.  She lacks regular exercise.  4. Chronic kidney disease, unspecified CKD stage Mackenzie Newman's GFR is 58.  Last creatinine level was 1.13 (stable) on 06/22/2021.  Likely due to hypertension.  5. Essential hypertension Mackenzie Newman's blood pressure is elevated today.  She is on hydrochlorothiazide 25 mg once daily.  She denies chest pain or DOE.  6. OSA on CPAP Mackenzie Newman is compliant with CPAP at night and she aims for 8 hours of sleep at night.  Assessment/Plan:   1. Other fatigue Mackenzie Newman does feel that her weight is  causing her energy to be lower than it should be. Fatigue may be related to obesity, depression or many other causes. Labs will be ordered, and in the meanwhile, Mackenzie Newman will focus on self care including making healthy food choices, increasing physical activity and focusing on stress reduction.  - EKG 12-Lead - VITAMIN D 25 Hydroxy (Vit-D Deficiency, Fractures) - TSH - T4, free - CBC with Differential/Platelet -  Vitamin B12  2. SOB (shortness of breath) on exertion Mackenzie Newman does feel that she gets out of breath more easily that she used to when she exercises. Mackenzie Newman's shortness of breath appears to be obesity related and exercise induced. She has agreed to work on weight loss and gradually increase exercise to treat her exercise induced shortness of breath. Will continue to monitor closely.  3. Prediabetes We will check labs today, and we will follow-up at Mackenzie Newman's next visit.  - Insulin, random  4. Chronic kidney disease, unspecified CKD stage Mackenzie Newman continue to work on her blood pressure control.  5. Essential hypertension Mackenzie Newman will continue hydrochlorothiazide 25 mg once daily per her PCP.  She is to look for improvements with weight loss.  6. OSA on CPAP Mackenzie Newman continue wearing her CPAP nightly with 8 hours of sleep.  7. Depression screening Mackenzie Newman had a positive depression screening. Depression is commonly associated with obesity and often results in emotional eating behaviors. We will monitor this closely and work on CBT to help improve the non-hunger eating patterns. Referral to Psychology may be required if no improvement is seen as she continues in our clinic.  8. Class 3 severe obesity with serious comorbidity and body mass index (BMI) of 40.0 to 44.9 in adult, unspecified obesity type ([place]) Mackenzie Newman is currently in the action stage of change and her goal is to continue with weight loss efforts. I recommend Mackenzie Newman begin the structured treatment plan as follows:  She has agreed to the Category 3 Plan.  Stay off phentermine prescription (blood pressure elevated, lack of weight loss, and insomnia).  Exercise goals: Looking at home exercise options.    Behavioral modification strategies: increasing lean protein intake, increasing water intake, decreasing eating out, no skipping meals, better snacking choices, avoiding temptations, planning for success, and decreasing junk food.  She was  informed of the importance of frequent follow-up visits to maximize her success with intensive lifestyle modifications for her multiple health conditions. She was informed we would discuss her lab results at her next visit unless there is a critical issue that needs to be addressed sooner. Mackenzie Newman agreed to keep her next visit at the agreed upon time to discuss these results.  Objective:   Blood pressure (!) 145/86, pulse 61, temperature 97.8 F (36.6 C), height 5\' 3"  (1.6 m), weight 249 lb (112.9 kg), SpO2 97 %. Body mass index is 44.11 kg/m.  EKG: Normal sinus rhythm, rate 63 BPM.  Indirect Calorimeter completed today shows a VO2 of 302 and a REE of 2088.  Her calculated basal metabolic rate is [id number] thus her basal metabolic rate is better than expected.  General: Cooperative, alert, well developed, in no acute distress. HEENT: Conjunctivae and lids unremarkable. Cardiovascular: Regular rhythm.  Lungs: Normal work of breathing. Neurologic: No focal deficits.   Lab Results  Component Value Date   CREATININE 1.13 (H) 06/22/2021   BUN 22 06/22/2021   NA 140 06/22/2021   K 3.6 06/22/2021   CL 102 06/22/2021   CO2 23 06/22/2021   Lab Results  Component Value Date  ALT 53 (H) 06/22/2021   AST 38 06/22/2021   ALKPHOS 93 06/22/2021   BILITOT 0.6 06/22/2021   Lab Results  Component Value Date   HGBA1C 6.1 (H) 06/22/2021   HGBA1C 5.6 10/07/2019   HGBA1C 5.6 06/23/2019   Lab Results  Component Value Date   INSULIN 25.2 (H) 10/16/2021   Lab Results  Component Value Date   TSH 3.070 10/16/2021   Lab Results  Component Value Date   CHOL 202 (H) 06/22/2021   HDL 61 06/22/2021   LDLCALC 122 (H) 06/22/2021   TRIG 105 06/22/2021   CHOLHDL 3.2 05/01/2017   Lab Results  Component Value Date   WBC 8.0 10/16/2021   HGB 13.1 10/16/2021   HCT 40.1 10/16/2021   MCV 86 10/16/2021   PLT 285 10/16/2021   No results found for: "IRON", "TIBC", "FERRITIN"  Attestation Statements:    Reviewed by clinician on day of visit: allergies, medications, problem list, medical history, surgical history, family history, social history, and previous encounter notes.   Trude Mcburney, am acting as transcriptionist for Seymour Bars, DO.  I have reviewed the above documentation for accuracy and completeness, and I agree with the above. Mackenzie Brink, DO

## 2021-10-30 ENCOUNTER — Encounter (INDEPENDENT_AMBULATORY_CARE_PROVIDER_SITE_OTHER): Payer: Self-pay | Admitting: Family Medicine

## 2021-10-30 ENCOUNTER — Ambulatory Visit (INDEPENDENT_AMBULATORY_CARE_PROVIDER_SITE_OTHER): Payer: Managed Care, Other (non HMO) | Admitting: Family Medicine

## 2021-10-30 VITALS — BP 144/83 | HR 58 | Temp 98.1°F | Ht 63.0 in | Wt 248.0 lb

## 2021-10-30 DIAGNOSIS — E669 Obesity, unspecified: Secondary | ICD-10-CM

## 2021-10-30 DIAGNOSIS — E8881 Metabolic syndrome: Secondary | ICD-10-CM | POA: Diagnosis not present

## 2021-10-30 DIAGNOSIS — Z6841 Body Mass Index (BMI) 40.0 and over, adult: Secondary | ICD-10-CM

## 2021-10-30 DIAGNOSIS — I10 Essential (primary) hypertension: Secondary | ICD-10-CM

## 2021-10-30 DIAGNOSIS — E559 Vitamin D deficiency, unspecified: Secondary | ICD-10-CM | POA: Diagnosis not present

## 2021-10-30 DIAGNOSIS — F3289 Other specified depressive episodes: Secondary | ICD-10-CM

## 2021-10-30 DIAGNOSIS — F32A Depression, unspecified: Secondary | ICD-10-CM | POA: Insufficient documentation

## 2021-10-30 DIAGNOSIS — R7303 Prediabetes: Secondary | ICD-10-CM | POA: Diagnosis not present

## 2021-10-30 MED ORDER — VITAMIN D (ERGOCALCIFEROL) 1.25 MG (50000 UNIT) PO CAPS: 50000.0000 [IU] | ORAL_CAPSULE | ORAL | 0 refills | Status: DC

## 2021-10-30 MED ORDER — BUPROPION HCL ER (SR) 150 MG PO TB12: 150.0000 mg | ORAL_TABLET | Freq: Two times a day (BID) | ORAL | 0 refills | Status: DC

## 2021-11-13 NOTE — Progress Notes (Signed)
Chief Complaint:   OBESITY Mackenzie Newman is here to discuss her progress with her obesity treatment plan along with follow-up of her obesity related diagnoses. Mackenzie Newman is on the Category 3 Plan and states she is following her eating plan approximately 50% of the time. Mackenzie Newman states she is doing 0 minutes 0 times per week.  Today's visit was #: 2 Starting weight: 249 lbs Starting date: 10/16/2021 Today's weight: 248 lbs Today's date: 10/30/2021 Total lbs lost to date: 1 Total lbs lost since last in-office visit: 1  Interim History: Mackenzie Newman did fairly well with mindful eating and eating on schedule. Still craving sugar. Portion sizes are satisfying. Listening to full cues. Has not added in exercise. Has increased water intake. Has an upcoming trip to [place] in October. Lacks enough vegetables.   Subjective:   1. Vitamin D deficiency Mackenzie Newman has a new diagnosis. Vitamin D level is 16.6, and she complains of fatigue. I discussed labs with the patient today.   2. Insulin resistance Mackenzie Newman's fasting insulin is 16.6. Working to decrease intake of sugar, but still has cravings. I discussed labs with the patient today.  3. Prediabetes Mackenzie Newman's last A1c was 6.1 on 06/22/2021.  4. Essential hypertension Mackenzie Newman's blood pressure is elevated today on HCTZ 25 mg once daily. Reports blood pressures run higher in the office.   5. Other depression, with emotional eating Mackenzie Newman's PHQ-9 score was 15, and she is not on mood medications. Stress cravings for sweets   Assessment/Plan:   1. Vitamin D deficiency Mackenzie Newman will begin prescription Vitamin D 50,000 IU weekly with no refills. We will recheck labs in 3 months.   - Vitamin D, Ergocalciferol, (DRISDOL) 1.25 MG (50000 UNIT) CAPS capsule; Take 1 capsule (50,000 Units total) by mouth every 7 (seven) days.  Dispense: 5 capsule; Refill: 0  2. Insulin resistance Mackenzie Newman will continue to decrease intake of added sugar, and increase walking time.   3.  Prediabetes We will plan to repeat A1c at her next visit. Mackenzie Newman will continue to decrease sugar intake and work on weight loss.   4. Essential hypertension Look for blood pressure improvements with weight loss. Mackenzie Newman will continue HCTZ 25 mg once daily.   5. Other depression, with emotional eating Mackenzie Newman will begin Wellbutrin SR 150 mg 1 tablet BID with no refills.   - buPROPion (WELLBUTRIN SR) 150 MG 12 hr tablet; Take 1 tablet (150 mg total) by mouth 2 (two) times daily.  Dispense: 60 tablet; Refill: 0  6. Obesity, current BMI 43.9 Mackenzie Newman is currently in the action stage of change. As such, her goal is to continue with weight loss efforts. She has agreed to the Category 3 Plan.   Exercise goals: Home exercise-bands/free weights 3 times per week.   Behavioral modification strategies: increasing lean protein intake, increasing vegetables, increasing water intake, decreasing eating out, no skipping meals, better snacking choices, travel eating strategies, and decreasing junk food.  Mackenzie Newman has agreed to follow-up with our clinic in 3 weeks. She was informed of the importance of frequent follow-up visits to maximize her success with intensive lifestyle modifications for her multiple health conditions.   Objective:   Blood pressure (!) 144/83, pulse (!) 58, temperature 98.1 F (36.7 C), height 5\' 3"  (1.6 m), weight 248 lb (112.5 kg), SpO2 97 %. Body mass index is 43.93 kg/m.  General: Cooperative, alert, well developed, in no acute distress. HEENT: Conjunctivae and lids unremarkable. Cardiovascular: Regular rhythm.  Lungs: Normal work of breathing. Neurologic: No  focal deficits.   Lab Results  Component Value Date   CREATININE 1.13 (H) 06/22/2021   BUN 22 06/22/2021   NA 140 06/22/2021   K 3.6 06/22/2021   CL 102 06/22/2021   CO2 23 06/22/2021   Lab Results  Component Value Date   ALT 53 (H) 06/22/2021   AST 38 06/22/2021   ALKPHOS 93 06/22/2021   BILITOT 0.6 06/22/2021    Lab Results  Component Value Date   HGBA1C 6.1 (H) 06/22/2021   HGBA1C 5.6 10/07/2019   HGBA1C 5.6 06/23/2019   Lab Results  Component Value Date   INSULIN 25.2 (H) 10/16/2021   Lab Results  Component Value Date   TSH 3.070 10/16/2021   Lab Results  Component Value Date   CHOL 202 (H) 06/22/2021   HDL 61 06/22/2021   LDLCALC 122 (H) 06/22/2021   TRIG 105 06/22/2021   CHOLHDL 3.2 05/01/2017   Lab Results  Component Value Date   VD25OH 16.6 (L) 10/16/2021   Lab Results  Component Value Date   WBC 8.0 10/16/2021   HGB 13.1 10/16/2021   HCT 40.1 10/16/2021   MCV 86 10/16/2021   PLT 285 10/16/2021   No results found for: "IRON", "TIBC", "FERRITIN"  Attestation Statements:   Reviewed by clinician on day of visit: allergies, medications, problem list, medical history, surgical history, family history, social history, and previous encounter notes.   Trude Mcburney, am acting as transcriptionist for Seymour Bars, DO.  I have reviewed the above documentation for accuracy and completeness, and I agree with the above. Glennis Brink, DO

## 2021-11-20 ENCOUNTER — Encounter (INDEPENDENT_AMBULATORY_CARE_PROVIDER_SITE_OTHER): Payer: Self-pay | Admitting: Family Medicine

## 2021-11-20 ENCOUNTER — Ambulatory Visit (INDEPENDENT_AMBULATORY_CARE_PROVIDER_SITE_OTHER): Payer: Managed Care, Other (non HMO) | Admitting: Family Medicine

## 2021-11-20 VITALS — BP 122/77 | HR 71 | Temp 98.5°F | Ht 63.0 in | Wt 241.0 lb

## 2021-11-20 DIAGNOSIS — R7303 Prediabetes: Secondary | ICD-10-CM

## 2021-11-20 DIAGNOSIS — E669 Obesity, unspecified: Secondary | ICD-10-CM

## 2021-11-20 DIAGNOSIS — E559 Vitamin D deficiency, unspecified: Secondary | ICD-10-CM

## 2021-11-20 DIAGNOSIS — F3289 Other specified depressive episodes: Secondary | ICD-10-CM

## 2021-11-20 DIAGNOSIS — G4733 Obstructive sleep apnea (adult) (pediatric): Secondary | ICD-10-CM | POA: Diagnosis not present

## 2021-11-20 DIAGNOSIS — Z9989 Dependence on other enabling machines and devices: Secondary | ICD-10-CM

## 2021-11-20 DIAGNOSIS — Z6841 Body Mass Index (BMI) 40.0 and over, adult: Secondary | ICD-10-CM

## 2021-11-20 MED ORDER — BUPROPION HCL ER (SR) 150 MG PO TB12: 150.0000 mg | ORAL_TABLET | Freq: Every day | ORAL | 0 refills | Status: DC

## 2021-11-20 MED ORDER — VITAMIN D (ERGOCALCIFEROL) 1.25 MG (50000 UNIT) PO CAPS: 50000.0000 [IU] | ORAL_CAPSULE | ORAL | 0 refills | Status: DC

## 2021-11-21 LAB — HEMOGLOBIN A1C
Est. average glucose Bld gHb Est-mCnc: 120 mg/dL
Hgb A1c MFr Bld: 5.8 % — ABNORMAL HIGH (ref 4.8–5.6)

## 2021-11-22 NOTE — Progress Notes (Signed)
Chief Complaint:   OBESITY Tierrah is here to discuss her progress with her obesity treatment plan along with follow-up of her obesity related diagnoses. Mckynlie is on the Category 3 Plan and states she is following her eating plan approximately 65% of the time. Jeffifer states she is doing some walking 30 minutes 2 times per week.  Today's visit was #: 3 Starting weight: 249 lbs Starting date: 10/16/2021 Today's weight: 241 lbs Today's date: 11/20/2021 Total lbs lost to date: 8 lbs Total lbs lost since last in-office visit: 7 lbs  Interim History: Doing better with meal plan.  Trying to get more veggies in.  Listening better to full cues.  Good support at home.  Less meal skipping.  Cut back on soda and increased water intake.  Plans to increase walking time.   Subjective:   1. OSA on CPAP Less hours of sleep in past due to side effects of PM dose of Wellbutrin SR.   2. Vitamin D deficiency She is currently taking prescription vitamin D 50,000 IU each week. She denies nausea, vomiting or muscle weakness.  3. Prediabetes Last A1c 6.1, fasting insulin 16.6.  actively  working on decreasing intake of sugar.   4. Other depression, with emotional eating Started Wellbutrin SR 150 mg BID.  Decreased cravings and decreased food volumes.  Has had an increase in insomnia.  Mood and attentiveness has improved.    Assessment/Plan:   1. OSA on CPAP Discontinue PM dose of Wellbutrin SR.  Continue CPAP nightly.   2. Vitamin D deficiency Continue and refill - Vitamin D, Ergocalciferol, (DRISDOL) 1.25 MG (50000 UNIT) CAPS capsule; Take 1 capsule (50,000 Units total) by mouth every 7 (seven) days.  Dispense: 12 capsule; Refill: 0  3. Prediabetes Consider adding metformin, check labs today.   - Hemoglobin A1c  4. Other depression, with emotional eating Change Wellbutrin to AM dose only.  Change - buPROPion (WELLBUTRIN SR) 150 MG 12 hr tablet; Take 1 tablet (150 mg total) by mouth  daily.  Dispense: 90 tablet; Refill: 0  5. Obesity, current BMI 42.8 Nohemy is currently in the action stage of change. As such, her goal is to continue with weight loss efforts. She has agreed to the Category 3 Plan.   Exercise goals:  walk 30 minutes at least 3 times per week.   Behavioral modification strategies: increasing lean protein intake, increasing vegetables, increasing water intake, decreasing eating out, no skipping meals, better snacking choices, and decreasing junk food.  Burnetta has agreed to follow-up with our clinic in 4 weeks. She was informed of the importance of frequent follow-up visits to maximize her success with intensive lifestyle modifications for her multiple health conditions.   Vayla was informed we would discuss her lab results at her next visit unless there is a critical issue that needs to be addressed sooner. Aviv agreed to keep her next visit at the agreed upon time to discuss these results.  Objective:   Blood pressure 122/77, pulse 71, temperature 98.5 F (36.9 C), height 5\' 3"  (1.6 m), weight 241 lb (109.3 kg), SpO2 97 %. Body mass index is 42.69 kg/m.  General: Cooperative, alert, well developed, in no acute distress. HEENT: Conjunctivae and lids unremarkable. Cardiovascular: Regular rhythm.  Lungs: Normal work of breathing. Neurologic: No focal deficits.   Lab Results  Component Value Date   CREATININE 1.13 (H) 06/22/2021   BUN 22 06/22/2021   NA 140 06/22/2021   K 3.6 06/22/2021  CL 102 06/22/2021   CO2 23 06/22/2021   Lab Results  Component Value Date   ALT 53 (H) 06/22/2021   AST 38 06/22/2021   ALKPHOS 93 06/22/2021   BILITOT 0.6 06/22/2021   Lab Results  Component Value Date   HGBA1C 5.8 (H) 11/20/2021   HGBA1C 6.1 (H) 06/22/2021   HGBA1C 5.6 10/07/2019   HGBA1C 5.6 06/23/2019   Lab Results  Component Value Date   INSULIN 25.2 (H) 10/16/2021   Lab Results  Component Value Date   TSH 3.070 10/16/2021   Lab Results   Component Value Date   CHOL 202 (H) 06/22/2021   HDL 61 06/22/2021   LDLCALC 122 (H) 06/22/2021   TRIG 105 06/22/2021   CHOLHDL 3.2 05/01/2017   Lab Results  Component Value Date   VD25OH 16.6 (L) 10/16/2021   Lab Results  Component Value Date   WBC 8.0 10/16/2021   HGB 13.1 10/16/2021   HCT 40.1 10/16/2021   MCV 86 10/16/2021   PLT 285 10/16/2021   No results found for: "IRON", "TIBC", "FERRITIN"  Attestation Statements:   Reviewed by clinician on day of visit: allergies, medications, problem list, medical history, surgical history, family history, social history, and previous encounter notes.  I, Davy Pique, am acting as Location manager for Loyal Gambler, DO.  I have reviewed the above documentation for accuracy and completeness, and I agree with the above. Dell Ponto, DO

## 2021-12-12 ENCOUNTER — Other Ambulatory Visit: Payer: Self-pay | Admitting: Family Medicine

## 2021-12-12 DIAGNOSIS — I10 Essential (primary) hypertension: Secondary | ICD-10-CM

## 2021-12-19 ENCOUNTER — Telehealth (INDEPENDENT_AMBULATORY_CARE_PROVIDER_SITE_OTHER): Payer: Managed Care, Other (non HMO) | Admitting: Family Medicine

## 2021-12-19 ENCOUNTER — Encounter (INDEPENDENT_AMBULATORY_CARE_PROVIDER_SITE_OTHER): Payer: Self-pay | Admitting: Family Medicine

## 2021-12-19 VITALS — BP 147/85 | HR 99 | Temp 98.3°F | Ht 63.0 in | Wt 242.0 lb

## 2021-12-19 DIAGNOSIS — E669 Obesity, unspecified: Secondary | ICD-10-CM

## 2021-12-19 DIAGNOSIS — Z6841 Body Mass Index (BMI) 40.0 and over, adult: Secondary | ICD-10-CM

## 2021-12-19 DIAGNOSIS — F3289 Other specified depressive episodes: Secondary | ICD-10-CM

## 2021-12-19 DIAGNOSIS — E559 Vitamin D deficiency, unspecified: Secondary | ICD-10-CM | POA: Diagnosis not present

## 2021-12-19 DIAGNOSIS — G4733 Obstructive sleep apnea (adult) (pediatric): Secondary | ICD-10-CM | POA: Diagnosis not present

## 2021-12-22 NOTE — Progress Notes (Unsigned)
I,Harveen Flesch S Ramon Brant,acting as a Neurosurgeon for Shirlee Latch, MD.,have documented all relevant documentation on the behalf of Shirlee Latch, MD,as directed by  Shirlee Latch, MD while in the presence of Shirlee Latch, MD.    Complete physical exam   [name]   DOB: January 19, 1966   56 y.o. Female  MRN: [id number] Visit Date: 12/25/2021  Today's healthcare provider: Shirlee Latch, MD   No chief complaint on file.  Subjective    Mackenzie Newman is a 56 y.o. female who presents today for a complete physical exam.  She reports consuming a {diet types:17450} diet. {Exercise:19826} She generally feels {well/fairly well/poorly:18703}. She reports sleeping {well/fairly well/poorly:18703}. She {does/does not:200015} have additional problems to discuss today.  HPI    Past Medical History:  Diagnosis Date   High blood pressure    High cholesterol    Obesity    Pre-diabetes    Past Surgical History:  Procedure Laterality Date   CESAREAN SECTION     COLONOSCOPY WITH PROPOFOL N/A 08/02/2017   Procedure: COLONOSCOPY WITH PROPOFOL;  Surgeon: Pasty Spillers, MD;  Location: [facility] ENDOSCOPY;  Service: Endoscopy;  Laterality: N/A;   MOHS SURGERY     Social History   Socioeconomic History   Marital status: Married    Spouse name: [name]   Number of children: Not on file   Years of education: Not on file   Highest education level: Not on file  Occupational History   Occupation: Research scientist (medical)  Tobacco Use   Smoking status: Never   Smokeless tobacco: Never  Vaping Use   Vaping Use: Never used  Substance and Sexual Activity   Alcohol use: No   Drug use: No   Sexual activity: Yes  Other Topics Concern   Not on file  Social History Narrative   Not on file   Social Determinants of Health   Financial Resource Strain: Not on file  Food Insecurity: Not on file  Transportation Needs: Not on file  Physical Activity: Not on file  Stress: Not on file   Social Connections: Not on file  Intimate Partner Violence: Not on file   Family Status  Relation Name Status   Neg Hx  (Not Specified)   Family History  Adopted: Yes  Problem Relation Age of Onset   Breast cancer Neg Hx    No Known Allergies  Patient Care Team: Erasmo Downer, MD as PCP - General (Family Medicine)   Medications: Outpatient Medications Prior to Visit  Medication Sig   buPROPion (WELLBUTRIN SR) 150 MG 12 hr tablet Take 1 tablet (150 mg total) by mouth daily.   clotrimazole-betamethasone (LOTRISONE) cream Apply 1 application topically 2 (two) times daily.   fluorouracil (EFUDEX) 5 % cream Apply 1 Application topically 2 (two) times daily.   hydrochlorothiazide (HYDRODIURIL) 25 MG tablet TAKE 1 TABLET(25 MG) BY MOUTH DAILY   naproxen (NAPROSYN) 500 MG tablet TAKE 1 TABLET(500 MG) BY MOUTH TWICE DAILY AS NEEDED   omeprazole (PRILOSEC) 20 MG capsule TAKE 1 CAPSULE(20 MG) BY MOUTH DAILY   Vitamin D, Ergocalciferol, (DRISDOL) 1.25 MG (50000 UNIT) CAPS capsule Take 1 capsule (50,000 Units total) by mouth every 7 (seven) days.   zolpidem (AMBIEN) 10 MG tablet Take 1 tablet (10 mg total) by mouth at bedtime as needed. for sleep   No facility-administered medications prior to visit.    Review of Systems  All other systems reviewed and are negative.   Last CBC Lab Results  Component Value Date  WBC 8.0 10/16/2021   HGB 13.1 10/16/2021   HCT 40.1 10/16/2021   MCV 86 10/16/2021   MCH 28.0 10/16/2021   RDW 12.9 10/16/2021   PLT 285 66/44/0347   Last metabolic panel Lab Results  Component Value Date   GLUCOSE 116 (H) 06/22/2021   NA 140 06/22/2021   K 3.6 06/22/2021   CL 102 06/22/2021   CO2 23 06/22/2021   BUN 22 06/22/2021   CREATININE 1.13 (H) 06/22/2021   EGFR 58 (L) 06/22/2021   CALCIUM 10.1 06/22/2021   PROT 7.4 06/22/2021   ALBUMIN 4.2 06/22/2021   LABGLOB 3.2 06/22/2021   AGRATIO 1.3 06/22/2021   BILITOT 0.6 06/22/2021   ALKPHOS 93  06/22/2021   AST 38 06/22/2021   ALT 53 (H) 06/22/2021   Last lipids Lab Results  Component Value Date   CHOL 202 (H) 06/22/2021   HDL 61 06/22/2021   LDLCALC 122 (H) 06/22/2021   TRIG 105 06/22/2021   CHOLHDL 3.2 05/01/2017   Last hemoglobin A1c Lab Results  Component Value Date   HGBA1C 5.8 (H) 11/20/2021   Last thyroid functions Lab Results  Component Value Date   TSH 3.070 10/16/2021   Last vitamin D Lab Results  Component Value Date   VD25OH 16.6 (L) 10/16/2021   Last vitamin B12 and Folate Lab Results  Component Value Date   VITAMINB12 379 10/16/2021      Objective    There were no vitals taken for this visit. BP Readings from Last 3 Encounters:  12/19/21 (!) 147/85  11/20/21 122/77  10/30/21 (!) 144/83   Wt Readings from Last 3 Encounters:  12/19/21 242 lb (109.8 kg)  11/20/21 241 lb (109.3 kg)  10/30/21 248 lb (112.5 kg)       Physical Exam  ***  Last depression screening scores    10/16/2021    7:21 AM 06/22/2021    8:14 AM 09/19/2020   10:24 AM  PHQ 2/9 Scores  PHQ - 2 Score 2 0 0  PHQ- 9 Score 15  0   Last fall risk screening    06/22/2021    8:13 AM  [place] in the past year? 0  Number falls in past yr: 0  Injury with Fall? 0   Last Audit-C alcohol use screening    06/22/2021    8:14 AM  Alcohol Use Disorder Test (AUDIT)  1. How often do you have a drink containing alcohol? 1  2. How many drinks containing alcohol do you have on a typical day when you are drinking? 0  3. How often do you have six or more drinks on one occasion? 0  AUDIT-C Score 1   A score of 3 or more in women, and 4 or more in men indicates increased risk for alcohol abuse, EXCEPT if all of the points are from question 1   No results found for any visits on 12/25/21.  Assessment & Plan    Routine Health Maintenance and Physical Exam  Exercise Activities and Dietary recommendations  Goals   None     Immunization History  Administered  Date(s) Administered   Influenza,inj,Quad PF,6+ Mos 02/11/2020, 12/02/2020   PFIZER(Purple Top)SARS-COV-2 Vaccination 05/19/2019, 06/09/2019, 03/01/2020   Tdap 09/20/2014   Zoster Recombinat (Shingrix) 09/29/2020, 12/02/2020    Health Maintenance  Topic Date Due   COVID-19 Vaccine (4 - Pfizer series) 04/26/2020   MAMMOGRAM  07/30/2021   INFLUENZA VACCINE  10/03/2021   PAP SMEAR-Modifier  06/17/2024   TETANUS/TDAP  09/19/2024   COLONOSCOPY (Pts 45-64yrs Insurance coverage will need to be confirmed)  08/03/2027   Hepatitis C Screening  Completed   HIV Screening  Completed   Zoster Vaccines- Shingrix  Completed   HPV VACCINES  Aged Out    Discussed health benefits of physical activity, and encouraged her to engage in regular exercise appropriate for her age and condition.  ***  No follow-ups on file.     {provider attestation***:1}   Lavon Paganini, MD  [facility] [phone] (phone) [phone] (fax)  [place]

## 2021-12-25 ENCOUNTER — Ambulatory Visit (INDEPENDENT_AMBULATORY_CARE_PROVIDER_SITE_OTHER): Payer: Managed Care, Other (non HMO) | Admitting: Family Medicine

## 2021-12-25 ENCOUNTER — Encounter: Payer: Self-pay | Admitting: Family Medicine

## 2021-12-25 VITALS — BP 139/72 | HR 76 | Temp 98.1°F | Resp 16 | Ht 63.0 in | Wt 246.5 lb

## 2021-12-25 DIAGNOSIS — Z23 Encounter for immunization: Secondary | ICD-10-CM | POA: Diagnosis not present

## 2021-12-25 DIAGNOSIS — Z1231 Encounter for screening mammogram for malignant neoplasm of breast: Secondary | ICD-10-CM | POA: Diagnosis not present

## 2021-12-25 DIAGNOSIS — E559 Vitamin D deficiency, unspecified: Secondary | ICD-10-CM

## 2021-12-25 DIAGNOSIS — R7303 Prediabetes: Secondary | ICD-10-CM | POA: Diagnosis not present

## 2021-12-25 DIAGNOSIS — I10 Essential (primary) hypertension: Secondary | ICD-10-CM

## 2021-12-25 DIAGNOSIS — E7800 Pure hypercholesterolemia, unspecified: Secondary | ICD-10-CM

## 2021-12-25 DIAGNOSIS — Z0000 Encounter for general adult medical examination without abnormal findings: Secondary | ICD-10-CM

## 2021-12-25 NOTE — Assessment & Plan Note (Signed)
Reviewed last lipid panel Not currently on a statin Recheck FLP and CMP Discussed diet and exercise  

## 2021-12-25 NOTE — Assessment & Plan Note (Signed)
Well controlled Continue current medications Recheck metabolic panel F/u in 6 months  

## 2021-12-25 NOTE — Patient Instructions (Signed)
Please call [facility] to schedule your annual routine mammogram [phone]  

## 2021-12-25 NOTE — Assessment & Plan Note (Signed)
Discussed importance of healthy weight management Discussed diet and exercise  

## 2021-12-25 NOTE — Assessment & Plan Note (Signed)
Recommend low carb diet ?Reviewed recent A1c  ?

## 2021-12-25 NOTE — Assessment & Plan Note (Signed)
Continue supplement Recheck level 

## 2021-12-27 ENCOUNTER — Other Ambulatory Visit: Payer: Self-pay | Admitting: Family Medicine

## 2021-12-27 LAB — COMPREHENSIVE METABOLIC PANEL
ALT: 50 IU/L — ABNORMAL HIGH (ref 0–32)
AST: 29 IU/L (ref 0–40)
Albumin/Globulin Ratio: 1.1 — ABNORMAL LOW (ref 1.2–2.2)
Albumin: 4 g/dL (ref 3.8–4.9)
Alkaline Phosphatase: 116 IU/L (ref 44–121)
BUN/Creatinine Ratio: 13 (ref 9–23)
BUN: 15 mg/dL (ref 6–24)
Bilirubin Total: 0.3 mg/dL (ref 0.0–1.2)
CO2: 22 mmol/L (ref 20–29)
Calcium: 9.7 mg/dL (ref 8.7–10.2)
Chloride: 101 mmol/L (ref 96–106)
Creatinine, Ser: 1.17 mg/dL — ABNORMAL HIGH (ref 0.57–1.00)
Globulin, Total: 3.6 g/dL (ref 1.5–4.5)
Glucose: 110 mg/dL — ABNORMAL HIGH (ref 70–99)
Potassium: 3.3 mmol/L — ABNORMAL LOW (ref 3.5–5.2)
Sodium: 141 mmol/L (ref 134–144)
Total Protein: 7.6 g/dL (ref 6.0–8.5)
eGFR: 55 mL/min/{1.73_m2} — ABNORMAL LOW (ref >59)

## 2021-12-27 LAB — LIPID PANEL WITH LDL/HDL RATIO
Cholesterol, Total: 206 mg/dL — ABNORMAL HIGH (ref 100–199)
HDL: 63 mg/dL (ref >39)
LDL Chol Calc (NIH): 123 mg/dL — ABNORMAL HIGH (ref 0–99)
LDL/HDL Ratio: 2 ratio (ref 0.0–3.2)
Triglycerides: 114 mg/dL (ref 0–149)
VLDL Cholesterol Cal: 20 mg/dL (ref 5–40)

## 2021-12-27 LAB — VITAMIN D 25 HYDROXY (VIT D DEFICIENCY, FRACTURES): Vit D, 25-Hydroxy: 35.8 ng/mL (ref 30.0–100.0)

## 2021-12-27 MED ORDER — LISINOPRIL 20 MG PO TABS: 20.0000 mg | ORAL_TABLET | Freq: Every day | ORAL | 3 refills | Status: DC

## 2021-12-27 NOTE — Progress Notes (Signed)
Chief Complaint:   OBESITY Mackenzie Newman is here to discuss her progress with her obesity treatment plan along with follow-up of her obesity related diagnoses. Mackenzie Newman is on the Category 3 Plan and states she is following her eating plan approximately 40% of the time. Mackenzie Newman states she is walking 30 minutes 5 times per week.  Today's visit was #: 4 Starting weight: 249 lbs Starting date: 10/16/2021 Today's weight: 242 lbs Today's date: 12/19/2021 Total lbs lost to date: 8 lbs Total lbs lost since last in-office visit: +1 lb  Interim History: Traveled to [place].  Getting ready for her and her granddaughter's birthday.  Out of her house for 2 weeks.  Food choices were different.  Denies hunger or cravings  back on track now.  Walking more often.   Subjective:   1. Vitamin D deficiency Energy level improving on prescription Vitamin D weekly.  Recheck Vitamin D level in 2 months.   2. OSA on CPAP Wears CPAP nightly, having one sided nasal congestion.    3. Other depression, with emotional eating She is doing better on Wellbutrin SR 150 mg every morning.  She denies insomnia with morning dose only.    Assessment/Plan:   1. Vitamin D deficiency Continue prescription Vitamin D, 50,000 IU weekly.  Recheck Vitamin D level in 2 months.   2. OSA on CPAP Continue CPAP nightly.  Try OTC Flonase 2 sprays per nostril at bedtime.  3. Other depression, with emotional eating Continue Wellbutrin 150 mg SR every morning, consider increasing to 200 mg next visit.   4. Obesity current BMI 43.0 Mackenzie Newman is currently in the action stage of change. As such, her goal is to continue with weight loss efforts. She has agreed to the Category 3 Plan.   Exercise goals:  walk 30 minutes 5 times per week consistently.   Behavioral modification strategies: increasing lean protein intake, increasing vegetables, increasing water intake, decreasing eating out, meal planning and cooking strategies, and planning for  success.  Mackenzie Newman has agreed to follow-up with our clinic in 3-4 weeks. She was informed of the importance of frequent follow-up visits to maximize her success with intensive lifestyle modifications for her multiple health conditions.   Objective:   Blood pressure (!) 147/85, pulse 99, temperature 98.3 F (36.8 C), height 5\' 3"  (1.6 m), weight 242 lb (109.8 kg), SpO2 96 %. Body mass index is 42.87 kg/m.  General: Cooperative, alert, well developed, in no acute distress. HEENT: Conjunctivae and lids unremarkable. Cardiovascular: Regular rhythm.  Lungs: Normal work of breathing. Neurologic: No focal deficits.   Lab Results  Component Value Date   CREATININE 1.17 (H) 12/25/2021   BUN 15 12/25/2021   NA 141 12/25/2021   K 3.3 (L) 12/25/2021   CL 101 12/25/2021   CO2 22 12/25/2021   Lab Results  Component Value Date   ALT 50 (H) 12/25/2021   AST 29 12/25/2021   ALKPHOS 116 12/25/2021   BILITOT 0.3 12/25/2021   Lab Results  Component Value Date   HGBA1C 5.8 (H) 11/20/2021   HGBA1C 6.1 (H) 06/22/2021   HGBA1C 5.6 10/07/2019   HGBA1C 5.6 06/23/2019   Lab Results  Component Value Date   INSULIN 25.2 (H) 10/16/2021   Lab Results  Component Value Date   TSH 3.070 10/16/2021   Lab Results  Component Value Date   CHOL 206 (H) 12/25/2021   HDL 63 12/25/2021   LDLCALC 123 (H) 12/25/2021   TRIG 114 12/25/2021  CHOLHDL 3.2 05/01/2017   Lab Results  Component Value Date   VD25OH 35.8 12/25/2021   VD25OH 16.6 (L) 10/16/2021   Lab Results  Component Value Date   WBC 8.0 10/16/2021   HGB 13.1 10/16/2021   HCT 40.1 10/16/2021   MCV 86 10/16/2021   PLT 285 10/16/2021   No results found for: "IRON", "TIBC", "FERRITIN"  Attestation Statements:   Reviewed by clinician on day of visit: allergies, medications, problem list, medical history, surgical history, family history, social history, and previous encounter notes.  I, Davy Pique, am acting as Location manager for  Loyal Gambler, DO.  I have reviewed the above documentation for accuracy and completeness, and I agree with the above. Dell Ponto, DO

## 2021-12-27 NOTE — Progress Notes (Signed)
K is low; Liver enzyme- ALT is elevated; Creatinine shows kidney reduction. Ensure 64 oz/water daily.  Recommend coming off of HCTZ and adding a different medication in for hypertension.  Cholesterol remains elevated with elevated bad/LDL cholesterol. I continue to recommend diet low in saturated fat and regular exercise - 30 min at least 5 times per week. A lower fat diet can assist with liver enzymes, otherwise, be mindful of NSAIDs, alcohol use.   The 10-year ASCVD risk score (Arnett DK, et al., 2019) is: 2.6%   Values used to calculate the score:     Age: 56 years     Sex: Female     Is Non-Hispanic African American: No     Diabetic: No     Tobacco smoker: No     Systolic Blood Pressure: 680 mmHg     Is BP treated: Yes     HDL Cholesterol: 63 mg/dL     Total Cholesterol: 206 mg/dL  Vit D remains improved. Continue to recommend 5000 IU daily to assist.  Mackenzie Newman, [place] [address] [place], [place] [address] [phone] (phone) [phone] (fax) [place]

## 2021-12-28 IMAGING — MG MM DIGITAL DIAGNOSTIC UNILAT*L* W/ TOMO W/ CAD
6 series · 6 of 18 positions shown · non-contrast
Comparison: Previous exam(s).

CLINICAL DATA: 53-year-old female for further evaluation of
possible LEFT breast asymmetry on new baseline screening mammogram.

EXAM:
DIGITAL DIAGNOSTIC LEFT MAMMOGRAM WITH CAD AND TOMO
ULTRASOUND LEFT BREAST

[L ML synth-2D]
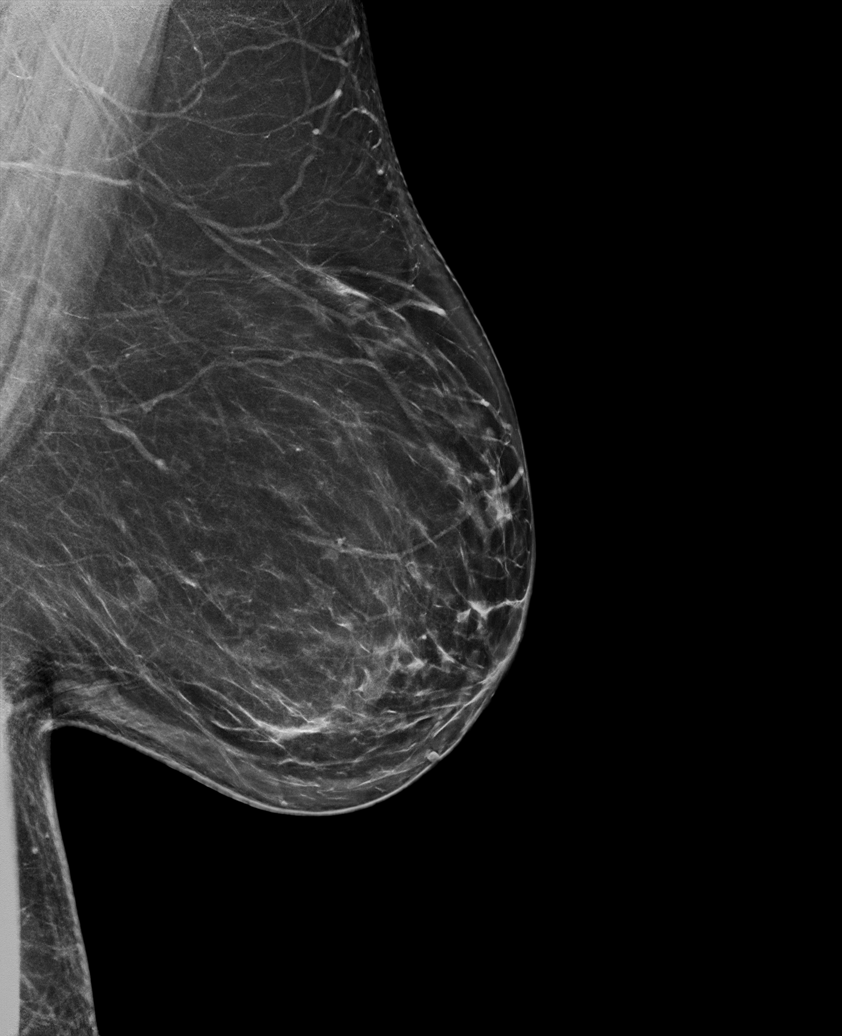

[L MLO synth-2D]
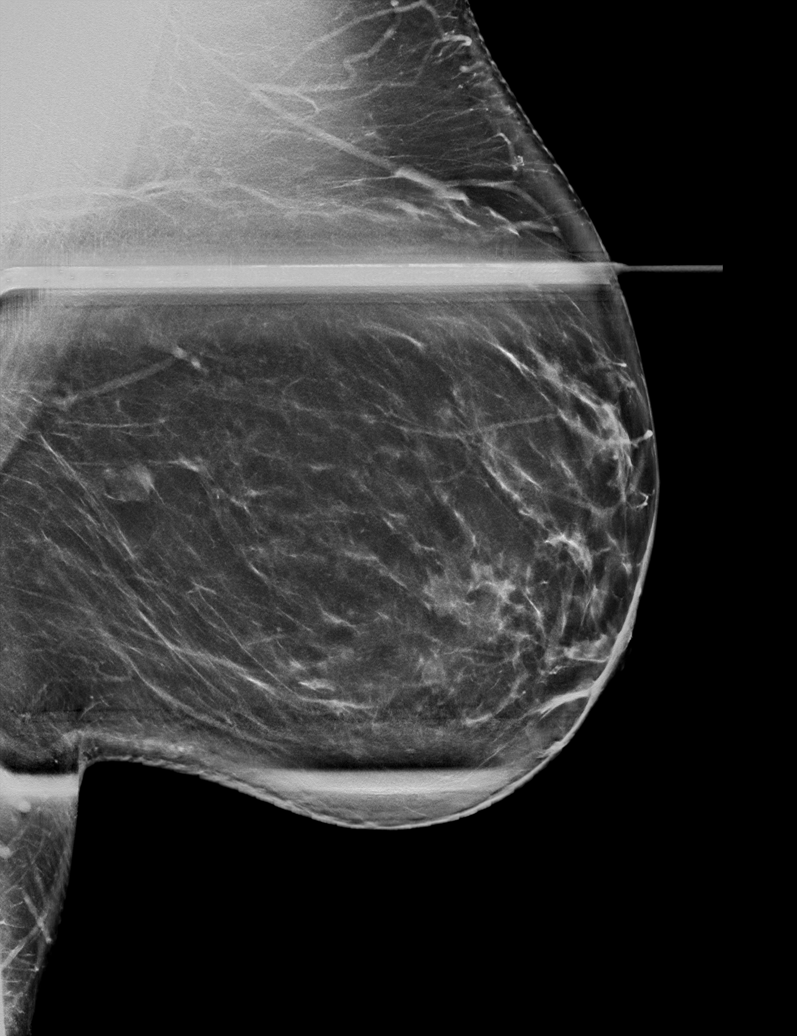

[L CC synth-2D]
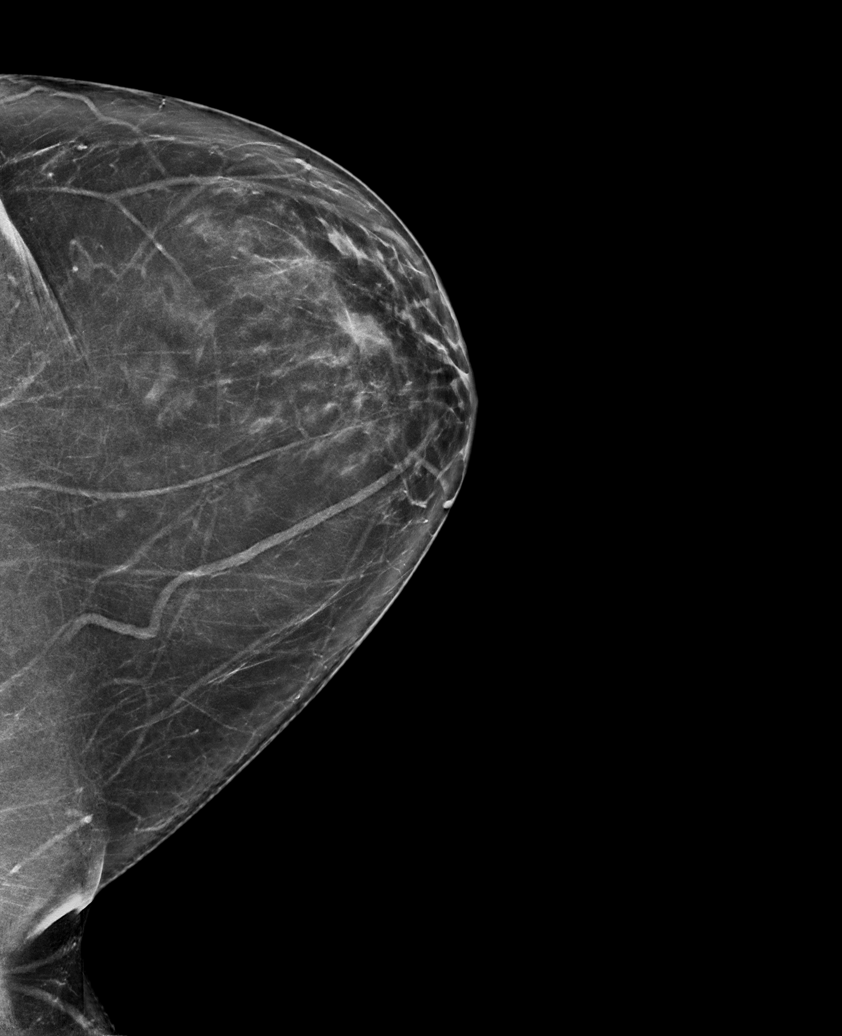

[L ML tomo · tomo slice 43/84.0]
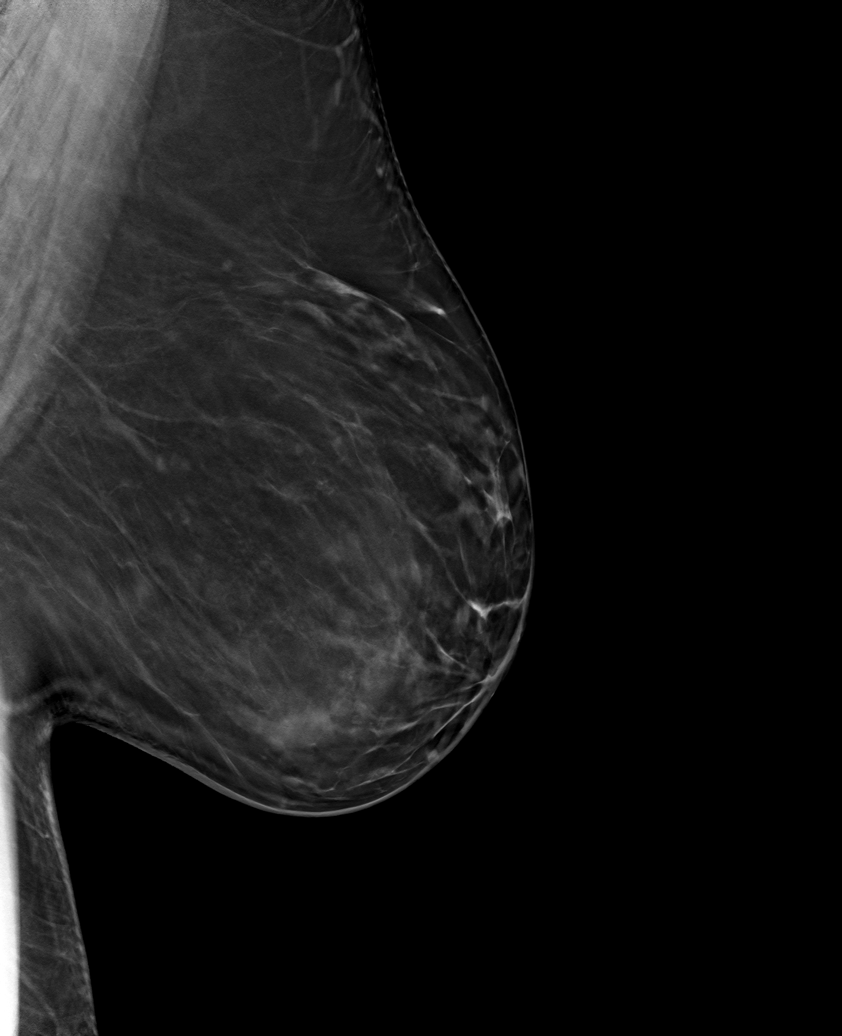

[L CC tomo · tomo slice 41/82.0]
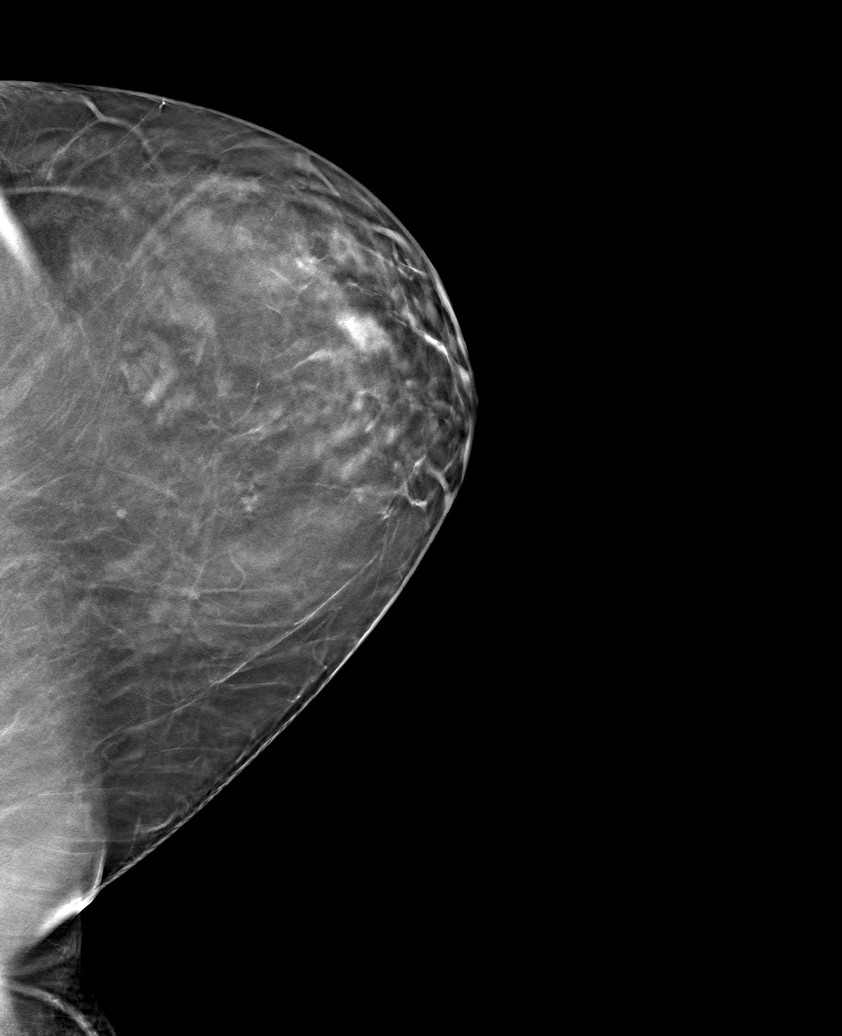

[L MLO tomo · tomo slice 44/87.0]
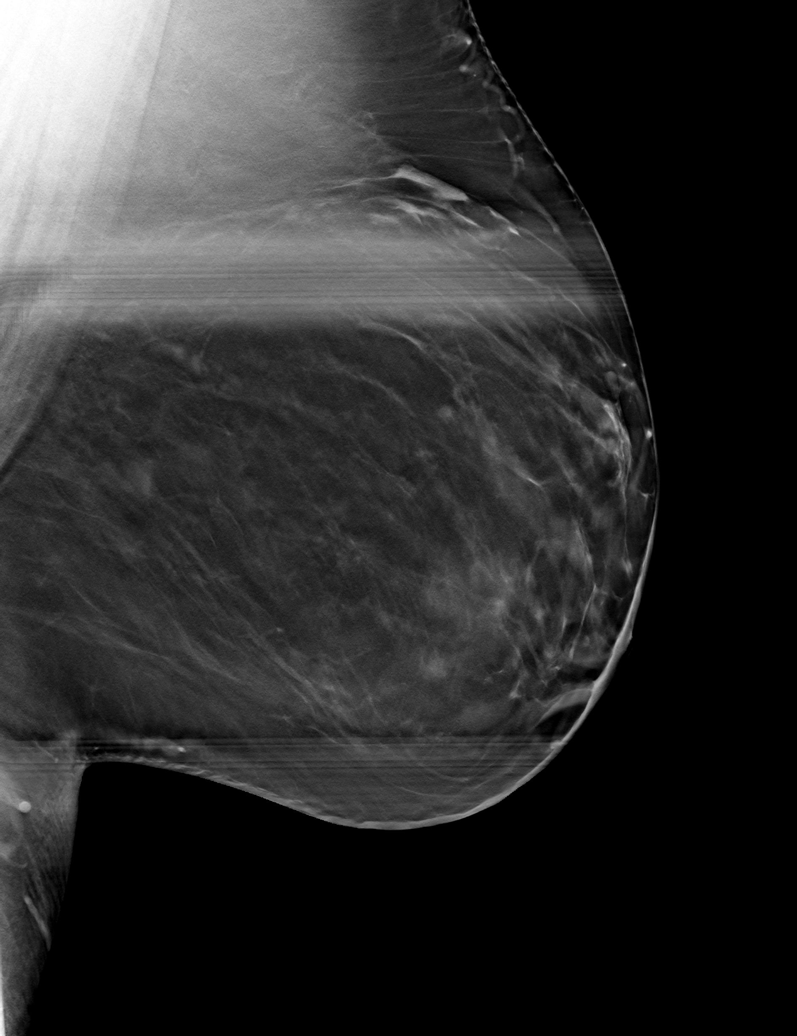

[6 of 18 positions shown; findings below may reference images not displayed]

ACR Breast Density Category b: There are scattered areas of
fibroglandular density.
FINDINGS: 2D/3D full field and spot compression views of the LEFT breast
demonstrate less apparent asymmetry within the posterior LEFT
breast.

Mammographic images were processed with CAD.

Targeted ultrasound is performed, showing no solid or cystic mass,
distortion or abnormal shadowing within the entire area of the
screening study finding.
IMPRESSION: Less apparent posterior LEFT breast asymmetry and without
sonographic correlate. This probably represents a benign process or
normal fibroglandular tissue but 6 month follow-up is recommended to
ensure stability.

RECOMMENDATION:
LEFT diagnostic mammogram with possible LEFT breast ultrasound in 6
months.

I have discussed the findings and recommendations with the patient.
If applicable, a reminder letter will be sent to the patient
regarding the next appointment.

BI-RADS CATEGORY  3: Probably benign.

## 2022-01-16 ENCOUNTER — Encounter (INDEPENDENT_AMBULATORY_CARE_PROVIDER_SITE_OTHER): Payer: Self-pay | Admitting: Family Medicine

## 2022-01-16 ENCOUNTER — Ambulatory Visit (INDEPENDENT_AMBULATORY_CARE_PROVIDER_SITE_OTHER): Payer: Managed Care, Other (non HMO) | Admitting: Family Medicine

## 2022-01-16 VITALS — BP 134/75 | HR 87 | Temp 98.3°F | Ht 63.0 in | Wt 242.0 lb

## 2022-01-16 DIAGNOSIS — I10 Essential (primary) hypertension: Secondary | ICD-10-CM | POA: Diagnosis not present

## 2022-01-16 DIAGNOSIS — E559 Vitamin D deficiency, unspecified: Secondary | ICD-10-CM | POA: Diagnosis not present

## 2022-01-16 DIAGNOSIS — E669 Obesity, unspecified: Secondary | ICD-10-CM

## 2022-01-16 DIAGNOSIS — F3289 Other specified depressive episodes: Secondary | ICD-10-CM

## 2022-01-16 DIAGNOSIS — Z6841 Body Mass Index (BMI) 40.0 and over, adult: Secondary | ICD-10-CM

## 2022-01-16 MED ORDER — BUPROPION HCL ER (XL) 300 MG PO TB24: 300.0000 mg | ORAL_TABLET | Freq: Every day | ORAL | 0 refills | Status: DC

## 2022-01-24 NOTE — Progress Notes (Signed)
I,Sulibeya S Dimas,acting as a Neurosurgeon for Shirlee Latch, MD.,have documented all relevant documentation on the behalf of Shirlee Latch, MD,as directed by  Shirlee Latch, MD while in the presence of Shirlee Latch, MD.     Established patient visit   [name]   DOB: August 26, 1965   56 y.o. Female  MRN: [id number] Visit Date: 01/30/2022  Today's healthcare provider: Shirlee Latch, MD   Chief Complaint  Patient presents with   Hypertension   Subjective    HPI  Follow up for hypertension  The patient was last seen for this 5 weeks ago. Changes made at last visit include no changes.  She reports excellent compliance with treatment. She feels that condition is Improved. She is not having side effects.   BP Readings from Last 3 Encounters:  01/30/22 121/71  01/16/22 134/75  12/25/21 139/72    -----------------------------------------------------------------------------------------   Medications: Outpatient Medications Prior to Visit  Medication Sig   buPROPion (WELLBUTRIN XL) 300 MG 24 hr tablet Take 1 tablet (300 mg total) by mouth daily.   clotrimazole-betamethasone (LOTRISONE) cream Apply 1 application topically 2 (two) times daily.   fluorouracil (EFUDEX) 5 % cream Apply 1 Application topically 2 (two) times daily.   naproxen (NAPROSYN) 500 MG tablet TAKE 1 TABLET(500 MG) BY MOUTH TWICE DAILY AS NEEDED   omeprazole (PRILOSEC) 20 MG capsule TAKE 1 CAPSULE(20 MG) BY MOUTH DAILY   Vitamin D, Ergocalciferol, (DRISDOL) 1.25 MG (50000 UNIT) CAPS capsule Take 1 capsule (50,000 Units total) by mouth every 7 (seven) days.   zolpidem (AMBIEN) 10 MG tablet Take 1 tablet (10 mg total) by mouth at bedtime as needed. for sleep   [DISCONTINUED] lisinopril (ZESTRIL) 20 MG tablet Take 1 tablet (20 mg total) by mouth daily.   No facility-administered medications prior to visit.    Review of Systems  Eyes:  Negative for visual disturbance.   Respiratory:  Negative for shortness of breath.   Cardiovascular:  Negative for chest pain, palpitations and leg swelling.  Neurological:  Negative for dizziness, light-headedness and headaches.       Objective    BP 121/71 (BP Location: Left Arm, Patient Position: Sitting, Cuff Size: Large)   Pulse 62   Temp 98.2 F (36.8 C) (Oral)   Resp 16   Ht 5\' 3"  (1.6 m)   Wt 241 lb (109.3 kg)   BMI 42.69 kg/m  BP Readings from Last 3 Encounters:  01/30/22 121/71  01/16/22 134/75  12/25/21 139/72   Wt Readings from Last 3 Encounters:  01/30/22 241 lb (109.3 kg)  01/16/22 242 lb (109.8 kg)  12/25/21 246 lb 8 oz (111.8 kg)      Physical Exam Vitals reviewed.  Constitutional:      General: She is not in acute distress.    Appearance: Normal appearance. She is well-developed. She is not diaphoretic.  HENT:     Head: Normocephalic and atraumatic.  Eyes:     General: No scleral icterus.    Conjunctiva/sclera: Conjunctivae normal.  Neck:     Thyroid: No thyromegaly.  Cardiovascular:     Rate and Rhythm: Normal rate and regular rhythm.     Pulses: Normal pulses.     Heart sounds: Normal heart sounds. No murmur heard. Pulmonary:     Effort: Pulmonary effort is normal. No respiratory distress.     Breath sounds: Normal breath sounds. No wheezing, rhonchi or rales.  Musculoskeletal:     Cervical back: Neck supple.  Right lower leg: No edema.     Left lower leg: No edema.  Lymphadenopathy:     Cervical: No cervical adenopathy.  Skin:    General: Skin is warm and dry.     Findings: No rash.  Neurological:     Mental Status: She is alert and oriented to person, place, and time. Mental status is at baseline.  Psychiatric:        Mood and Affect: Mood normal.        Behavior: Behavior normal.       No results found for any visits on 01/30/22.  Assessment & Plan     Problem List Items Addressed This Visit       Cardiovascular and Mediastinum   Essential  hypertension - Primary    Well controlled Continue current medications - will switch to combo pill of lisinopril-hctz at current doses Reviewed metabolic panel      Relevant Medications   lisinopril-hydrochlorothiazide (ZESTORETIC) 20-25 MG tablet   Other Visit Diagnoses     Viral URI with cough          - symptoms and exam c/w viral URI - no evidence of strep pharyngitis, CAP, AOM, bacterial sinusitis, or other bacterial infection - discussed symptomatic management, natural course, and return precautions   Return for as scheduled.      I, Shirlee Latch, MD, have reviewed all documentation for this visit. The documentation on 01/30/22 for the exam, diagnosis, procedures, and orders are all accurate and complete.   Neya Creegan, Marzella Schlein, MD, MPH [facility]

## 2022-01-26 NOTE — Progress Notes (Signed)
Chief Complaint:   OBESITY Mackenzie Newman is here to discuss her progress with her obesity treatment plan along with follow-up of her obesity related diagnoses. Mackenzie Newman is on the Category 3 Plan and states she is following her eating plan approximately 50% of the time. Mackenzie Newman states she is stair walking 30 minutes 7 times per week.  Today's visit was #: 5 Starting weight: 249 lbs Starting date: 10/16/2021 Today's weight: 242 lbs Today's date: 01/16/2022 Total lbs lost to date: 7 lbs Total lbs lost since last in-office visit: 0  Interim History: She sometimes skips breakfast due to lack of appetite.  She may drink diet soda in the morning.  Portions are smaller.  She works from home, lunch at home or she orders chicken pita for lunch when in the office. She is having meat, starch and veggies for dinner.  She denies night time snacks or cravings.    Subjective:   1. Essential hypertension Blood pressure improved on lisinopril 20 mg daily per PCP.    2. Vitamin D deficiency Vitamin D level, recheck in October by PCP, 35.  Improving on prescription Vitamin D weekly.   3. Other depression with emotional eating Taking Wellbutrin SR 150 mg every morning without adverse side effects. She is stressed at home and at work, high stress.   Assessment/Plan:   1. Essential hypertension Continue lisinopril per PCP.   2. Vitamin D deficiency Continue prescription Vitamin D 50,000 IU weekly. Recheck level in 3 months.   3. Other depression with emotional eating Increase - buPROPion (WELLBUTRIN XL) 300 MG 24 hr tablet; Take 1 tablet (300 mg total) by mouth daily.  Dispense: 90 tablet; Refill: 0  4. Obesity, current BMI 42.9 1) Increase water intake to 90 oz per day.  2) Stress reduction with husbands chronic illness.  3) ok to keep 16:8, intermittent fasting.    Mackenzie Newman is currently in the action stage of change. As such, her goal is to continue with weight loss efforts. She has agreed to the  Category 3 Plan.   Exercise goals:  recommend a smart watch to track daily steps.   Behavioral modification strategies: increasing lean protein intake, increasing vegetables, increasing water intake, decreasing eating out, meal planning and cooking strategies, keeping healthy foods in the home, and holiday eating strategies .  Mackenzie Newman has agreed to follow-up with our clinic in 4-5 weeks. She was informed of the importance of frequent follow-up visits to maximize her success with intensive lifestyle modifications for her multiple health conditions.   Objective:   Blood pressure 134/75, pulse 87, temperature 98.3 F (36.8 C), height 5\' 3"  (1.6 m), weight 242 lb (109.8 kg), SpO2 97 %. Body mass index is 42.87 kg/m.  General: Cooperative, alert, well developed, in no acute distress. HEENT: Conjunctivae and lids unremarkable. Cardiovascular: Regular rhythm.  Lungs: Normal work of breathing. Neurologic: No focal deficits.   Lab Results  Component Value Date   CREATININE 1.17 (H) 12/25/2021   BUN 15 12/25/2021   NA 141 12/25/2021   K 3.3 (L) 12/25/2021   CL 101 12/25/2021   CO2 22 12/25/2021   Lab Results  Component Value Date   ALT 50 (H) 12/25/2021   AST 29 12/25/2021   ALKPHOS 116 12/25/2021   BILITOT 0.3 12/25/2021   Lab Results  Component Value Date   HGBA1C 5.8 (H) 11/20/2021   HGBA1C 6.1 (H) 06/22/2021   HGBA1C 5.6 10/07/2019   HGBA1C 5.6 06/23/2019   Lab Results  Component Value Date   INSULIN 25.2 (H) 10/16/2021   Lab Results  Component Value Date   TSH 3.070 10/16/2021   Lab Results  Component Value Date   CHOL 206 (H) 12/25/2021   HDL 63 12/25/2021   LDLCALC 123 (H) 12/25/2021   TRIG 114 12/25/2021   CHOLHDL 3.2 05/01/2017   Lab Results  Component Value Date   VD25OH 35.8 12/25/2021   VD25OH 16.6 (L) 10/16/2021   Lab Results  Component Value Date   WBC 8.0 10/16/2021   HGB 13.1 10/16/2021   HCT 40.1 10/16/2021   MCV 86 10/16/2021   PLT 285  10/16/2021   No results found for: "IRON", "TIBC", "FERRITIN"  Attestation Statements:   Reviewed by clinician on day of visit: allergies, medications, problem list, medical history, surgical history, family history, social history, and previous encounter notes.  I have personally spent 45 minutes total time today in preparation, patient care, and documentation for this visit, including the following: review of clinical lab tests; review of medical tests/procedures/services.    I, Davy Pique, am acting as Location manager for Loyal Gambler, DO.  I have reviewed the above documentation for accuracy and completeness, and I agree with the above. Dell Ponto, DO

## 2022-01-30 ENCOUNTER — Encounter: Payer: Self-pay | Admitting: Family Medicine

## 2022-01-30 ENCOUNTER — Ambulatory Visit (INDEPENDENT_AMBULATORY_CARE_PROVIDER_SITE_OTHER): Payer: Managed Care, Other (non HMO) | Admitting: Family Medicine

## 2022-01-30 VITALS — BP 121/71 | HR 62 | Temp 98.2°F | Resp 16 | Ht 63.0 in | Wt 241.0 lb

## 2022-01-30 DIAGNOSIS — I10 Essential (primary) hypertension: Secondary | ICD-10-CM | POA: Diagnosis not present

## 2022-01-30 DIAGNOSIS — J069 Acute upper respiratory infection, unspecified: Secondary | ICD-10-CM

## 2022-01-30 MED ORDER — LISINOPRIL-HYDROCHLOROTHIAZIDE 20-25 MG PO TABS: 1.0000 | ORAL_TABLET | Freq: Every day | ORAL | 3 refills | Status: DC

## 2022-01-30 MED ORDER — GUAIFENESIN-CODEINE 100-10 MG/5ML PO SYRP: 5.0000 mL | ORAL_SOLUTION | Freq: Three times a day (TID) | ORAL | 0 refills | Status: DC | PRN

## 2022-01-30 NOTE — Assessment & Plan Note (Signed)
Well controlled Continue current medications - will switch to combo pill of lisinopril-hctz at current doses Reviewed metabolic panel

## 2022-02-01 ENCOUNTER — Other Ambulatory Visit: Payer: Self-pay | Admitting: Family Medicine

## 2022-02-01 DIAGNOSIS — N6489 Other specified disorders of breast: Secondary | ICD-10-CM

## 2022-02-20 ENCOUNTER — Encounter (INDEPENDENT_AMBULATORY_CARE_PROVIDER_SITE_OTHER): Payer: Self-pay | Admitting: Family Medicine

## 2022-02-20 ENCOUNTER — Ambulatory Visit (INDEPENDENT_AMBULATORY_CARE_PROVIDER_SITE_OTHER): Payer: Managed Care, Other (non HMO) | Admitting: Family Medicine

## 2022-02-20 VITALS — BP 107/72 | HR 89 | Temp 99.3°F | Ht 63.0 in | Wt 238.0 lb

## 2022-02-20 DIAGNOSIS — F329 Major depressive disorder, single episode, unspecified: Secondary | ICD-10-CM | POA: Diagnosis not present

## 2022-02-20 DIAGNOSIS — R632 Polyphagia: Secondary | ICD-10-CM

## 2022-02-20 DIAGNOSIS — E669 Obesity, unspecified: Secondary | ICD-10-CM

## 2022-02-20 DIAGNOSIS — Z6841 Body Mass Index (BMI) 40.0 and over, adult: Secondary | ICD-10-CM

## 2022-02-20 DIAGNOSIS — E559 Vitamin D deficiency, unspecified: Secondary | ICD-10-CM

## 2022-02-20 MED ORDER — VITAMIN D (ERGOCALCIFEROL) 1.25 MG (50000 UNIT) PO CAPS: 50000.0000 [IU] | ORAL_CAPSULE | ORAL | 0 refills | Status: DC

## 2022-02-23 ENCOUNTER — Ambulatory Visit
Admission: RE | Admit: 2022-02-23 | Discharge: 2022-02-23 | Disposition: A | Discharge status: Home / Self Care | Payer: Managed Care, Other (non HMO) | Source: Ambulatory Visit | Attending: Family Medicine | Admitting: Family Medicine

## 2022-02-23 DIAGNOSIS — N6489 Other specified disorders of breast: Secondary | ICD-10-CM | POA: Insufficient documentation

## 2022-02-27 NOTE — Progress Notes (Signed)
Chief Complaint:   OBESITY Mackenzie Newman is here to discuss her progress with her obesity treatment plan along with follow-up of her obesity related diagnoses. Mackenzie Newman is on the Category 3 Plan and states she is following her eating plan approximately 60% of the time. Mackenzie Newman states she stair walking 45 minutes 5-7 times per week.  Today's visit was #: 6 Starting weight: 249 lbs Starting date: 10/16/2021 Today's weight: 238 lbs Today's date: 02/20/2022 Total lbs lost to date: 11 lbs Total lbs lost since last in-office visit: 4 lbs  Interim History: Progress: Decreased 11 pounds in 4 months.  Appetite has reduced.  Incorporating 16: 8, intermittent fasting.  Hydrating well with water during fasting period.  Reduced soda intake.  Walking stairs more.  Staying in town for the holidays.  Denies cravings.  Subjective:   1. Vitamin D deficiency Vitamin D level of 35.8 on 12/25/2021.  Patient is on prescription vitamin D 50,000 IU weekly.  Energy level is improving.  2. Polyphagia Improving with intermittent fasting, increasedprotein and fiber intake and less refined carbs.  3. Major depressive disorder, remission status unspecified, unspecified whether recurrent Mood improved on increase Wellbutrin to 300 mg XL in the morning.  Stress is stable with husband's chronic illness.  Denies adverse side effects from Wellbutrin.  Assessment/Plan:   1. Vitamin D deficiency Recheck level in 2 months.  Refill- Vitamin D, Ergocalciferol, (DRISDOL) 1.25 MG (50000 UNIT) CAPS capsule; Take 1 capsule (50,000 Units total) by mouth every 7 (seven) days.  Dispense: 12 capsule; Refill: 0  2. Polyphagia Consider addition of a GLP-1 agonist. Continue to get in lean protein with meals and snacks.  3. Major depressive disorder, remission status unspecified, unspecified whether recurrent Continue Wellbutrin 300 mg XL every morning.  4. Obesity,current BMI 42.3 1.  Increased 3 pounds muscle mass and  decreased 6 pounds of body fat in the past 4 weeks. 2.  Firm up plan to increase exercise in the new year.  Mackenzie Newman is currently in the action stage of change. As such, her goal is to continue with weight loss efforts. She has agreed to the Category 3 Plan.   Exercise goals:  As is.  Behavioral modification strategies: increasing lean protein intake, increasing vegetables, increasing water intake, meal planning and cooking strategies, keeping healthy foods in the home, and planning for success.  Mackenzie Newman has agreed to follow-up with our clinic in 4 weeks. She was informed of the importance of frequent follow-up visits to maximize her success with intensive lifestyle modifications for her multiple health conditions.   Objective:   Blood pressure 107/72, pulse 89, temperature 99.3 F (37.4 C), height 5\' 3"  (1.6 m), weight 238 lb (108 kg), SpO2 94 %. Body mass index is 42.16 kg/m.  General: Cooperative, alert, well developed, in no acute distress. HEENT: Conjunctivae and lids unremarkable. Cardiovascular: Regular rhythm.  Lungs: Normal work of breathing. Neurologic: No focal deficits.   Lab Results  Component Value Date   CREATININE 1.17 (H) 12/25/2021   BUN 15 12/25/2021   NA 141 12/25/2021   K 3.3 (L) 12/25/2021   CL 101 12/25/2021   CO2 22 12/25/2021   Lab Results  Component Value Date   ALT 50 (H) 12/25/2021   AST 29 12/25/2021   ALKPHOS 116 12/25/2021   BILITOT 0.3 12/25/2021   Lab Results  Component Value Date   HGBA1C 5.8 (H) 11/20/2021   HGBA1C 6.1 (H) 06/22/2021   HGBA1C 5.6 10/07/2019   HGBA1C 5.6  06/23/2019   Lab Results  Component Value Date   INSULIN 25.2 (H) 10/16/2021   Lab Results  Component Value Date   TSH 3.070 10/16/2021   Lab Results  Component Value Date   CHOL 206 (H) 12/25/2021   HDL 63 12/25/2021   LDLCALC 123 (H) 12/25/2021   TRIG 114 12/25/2021   CHOLHDL 3.2 05/01/2017   Lab Results  Component Value Date   VD25OH 35.8 12/25/2021    VD25OH 16.6 (L) 10/16/2021   Lab Results  Component Value Date   WBC 8.0 10/16/2021   HGB 13.1 10/16/2021   HCT 40.1 10/16/2021   MCV 86 10/16/2021   PLT 285 10/16/2021   No results found for: "IRON", "TIBC", "FERRITIN"  Attestation Statements:   Reviewed by clinician on day of visit: allergies, medications, problem list, medical history, surgical history, family history, social history, and previous encounter notes.  I, Malcolm Metro, am acting as Energy manager for Seymour Bars, DO.  I have reviewed the above documentation for accuracy and completeness, and I agree with the above. Glennis Brink, DO

## 2022-02-28 ENCOUNTER — Other Ambulatory Visit: Payer: Managed Care, Other (non HMO)

## 2022-03-01 ENCOUNTER — Other Ambulatory Visit (INDEPENDENT_AMBULATORY_CARE_PROVIDER_SITE_OTHER): Payer: Self-pay | Admitting: Family Medicine

## 2022-03-01 DIAGNOSIS — F3289 Other specified depressive episodes: Secondary | ICD-10-CM

## 2022-03-26 ENCOUNTER — Other Ambulatory Visit: Payer: Self-pay

## 2022-03-26 ENCOUNTER — Telehealth (INDEPENDENT_AMBULATORY_CARE_PROVIDER_SITE_OTHER): Payer: Self-pay | Admitting: *Deleted

## 2022-03-26 ENCOUNTER — Ambulatory Visit (INDEPENDENT_AMBULATORY_CARE_PROVIDER_SITE_OTHER): Payer: Managed Care, Other (non HMO) | Admitting: Family Medicine

## 2022-03-26 ENCOUNTER — Encounter (INDEPENDENT_AMBULATORY_CARE_PROVIDER_SITE_OTHER): Payer: Self-pay | Admitting: Family Medicine

## 2022-03-26 VITALS — BP 102/67 | HR 76 | Temp 98.4°F | Ht 63.0 in | Wt 236.0 lb

## 2022-03-26 DIAGNOSIS — Z6841 Body Mass Index (BMI) 40.0 and over, adult: Secondary | ICD-10-CM

## 2022-03-26 DIAGNOSIS — R7303 Prediabetes: Secondary | ICD-10-CM | POA: Diagnosis not present

## 2022-03-26 DIAGNOSIS — E559 Vitamin D deficiency, unspecified: Secondary | ICD-10-CM

## 2022-03-26 DIAGNOSIS — F329 Major depressive disorder, single episode, unspecified: Secondary | ICD-10-CM

## 2022-03-26 DIAGNOSIS — E669 Obesity, unspecified: Secondary | ICD-10-CM

## 2022-03-26 MED ORDER — WEGOVY 0.25 MG/0.5ML ~~LOC~~ SOAJ
0.2500 mg | SUBCUTANEOUS | 0 refills | Status: DC
  Filled 2022-03-26 – 2022-04-25 (×4): qty 2, 28d supply, fill #0

## 2022-03-26 MED ORDER — BUPROPION HCL ER (XL) 300 MG PO TB24
300.0000 mg | ORAL_TABLET | Freq: Every day | ORAL | 0 refills | Status: DC
  Filled 2022-03-26 – 2022-04-25 (×2): qty 30, 30d supply, fill #0

## 2022-03-26 MED ORDER — VITAMIN D (ERGOCALCIFEROL) 1.25 MG (50000 UNIT) PO CAPS
50000.0000 [IU] | ORAL_CAPSULE | ORAL | 0 refills | Status: DC
  Filled 2022-03-26: qty 4, 28d supply, fill #0

## 2022-03-26 NOTE — Telephone Encounter (Signed)
Prior authorization done for patients Wegovy via cover my meds. Waiting on determination.

## 2022-04-11 ENCOUNTER — Encounter: Payer: Self-pay | Admitting: Family Medicine

## 2022-04-13 NOTE — Progress Notes (Unsigned)
Chief Complaint:   OBESITY Mackenzie Newman is here to discuss her progress with her obesity treatment plan along with follow-up of her obesity related diagnoses. Mackenzie Newman is on the Category 3 Plan and states she is following her eating plan approximately 60% of the time. Mackenzie Newman states she is stair walking 30-45 minutes 5-7 times per week.  Today's visit was #: 7 Starting weight: 249 lbs Starting date: 10/16/2021 Today's weight: 236 lbs Today's date: 03/26/2022 Total lbs lost to date: 13 Total lbs lost since last in-office visit: 2  Interim History: Mackenzie Newman has noticed a decrease in appetite. She is incorporating 16:8 intermittent fasting. She was able to try Mackenzie Newman for a month in May 2023 and liked the effects but stopped it due to supply shortage. Pt is purposefully walking the stairs more.  Subjective:   1. Vitamin D deficiency Mackenzie Newman's Vitamin D level was 35.8 in October 2023, and she is taking prescription Vitamin D 50,000 IU weekly. Energy level is improving.  2. Major depressive disorder, remission status unspecified, unspecified whether recurrent Mood improving. Pt is taking Wellbutrin XL 300 mg every morning. She denies adverse side effects. Pt has chronic stress with husband's illness.  3. Pre-diabetes Her A1c was 5.8 on 11/20/2021. Mackenzie Newman has lost 9 lbs and reduced intake of sweets.  Assessment/Plan:   1. Vitamin D deficiency Recheck Vitamin D in 1 month.  Refill- Vitamin D, Ergocalciferol, (DRISDOL) 1.25 MG (50000 UNIT) CAPS capsule; Take 1 capsule (50,000 Units total) by mouth every 7 (seven) days.  Dispense: 12 capsule; Refill: 0  2. Major depressive disorder, remission status unspecified, unspecified whether recurrent Continue Wellbutrin XL 300 mg every morning.   Refill- buPROPion (WELLBUTRIN XL) 300 MG 24 hr tablet; Take 1 tablet (300 mg total) by mouth daily.  Dispense: 90 tablet; Refill: 0  3. Pre-diabetes Recheck A1c in 1 month. Increase walking time and track steps  with a pedometer.  4. Obesity,current BMI 41.9 Start- Semaglutide-Weight Management (WEGOVY) 0.25 MG/0.5ML SOAJ; Inject 0.25 mg into the skin once a week.  Dispense: 2 mL; Refill: 0  Mackenzie Newman is currently in the action stage of change. As such, her goal is to continue with weight loss efforts. She has agreed to the Category 3 Plan.   Exercise goals:  Track daily steps.  Behavioral modification strategies: increasing lean protein intake, increasing vegetables, increasing water intake, meal planning and cooking strategies, keeping healthy foods in the home, and planning for success.  Mackenzie Newman has agreed to follow-up with our clinic in 4 weeks. She was informed of the importance of frequent follow-up visits to maximize her success with intensive lifestyle modifications for her multiple health conditions.   Objective:   Blood pressure 102/67, pulse 76, temperature 98.4 F (36.9 C), height 5' 3"$  (1.6 m), weight 236 lb (107 kg), SpO2 95 %. Body mass index is 41.81 kg/m.  General: Cooperative, alert, well developed, in no acute distress. HEENT: Conjunctivae and lids unremarkable. Cardiovascular: Regular rhythm.  Lungs: Normal work of breathing. Neurologic: No focal deficits.   Lab Results  Component Value Date   CREATININE 1.17 (H) 12/25/2021   BUN 15 12/25/2021   NA 141 12/25/2021   K 3.3 (L) 12/25/2021   CL 101 12/25/2021   CO2 22 12/25/2021   Lab Results  Component Value Date   ALT 50 (H) 12/25/2021   AST 29 12/25/2021   ALKPHOS 116 12/25/2021   BILITOT 0.3 12/25/2021   Lab Results  Component Value Date   HGBA1C 5.8 (  H) 11/20/2021   HGBA1C 6.1 (H) 06/22/2021   HGBA1C 5.6 10/07/2019   HGBA1C 5.6 06/23/2019   Lab Results  Component Value Date   INSULIN 25.2 (H) 10/16/2021   Lab Results  Component Value Date   TSH 3.070 10/16/2021   Lab Results  Component Value Date   CHOL 206 (H) 12/25/2021   HDL 63 12/25/2021   LDLCALC 123 (H) 12/25/2021   TRIG 114 12/25/2021    CHOLHDL 3.2 05/01/2017   Lab Results  Component Value Date   VD25OH 35.8 12/25/2021   VD25OH 16.6 (L) 10/16/2021   Lab Results  Component Value Date   WBC 8.0 10/16/2021   HGB 13.1 10/16/2021   HCT 40.1 10/16/2021   MCV 86 10/16/2021   PLT 285 10/16/2021    Attestation Statements:   Reviewed by clinician on day of visit: allergies, medications, problem list, medical history, surgical history, family history, social history, and previous encounter notes.  I, Kathlene November, BS, CMA, am acting as transcriptionist for Loyal Gambler, DO.   I have reviewed the above documentation for accuracy and completeness, and I agree with the above. Dell Ponto, DO

## 2022-04-14 ENCOUNTER — Other Ambulatory Visit (INDEPENDENT_AMBULATORY_CARE_PROVIDER_SITE_OTHER): Payer: Self-pay | Admitting: Family Medicine

## 2022-04-14 DIAGNOSIS — F329 Major depressive disorder, single episode, unspecified: Secondary | ICD-10-CM

## 2022-04-15 ENCOUNTER — Other Ambulatory Visit: Payer: Self-pay

## 2022-04-16 NOTE — Telephone Encounter (Signed)
Prior authorization approved for patients Wegovy. Patient notified. approved through 10/15/2022

## 2022-04-25 ENCOUNTER — Other Ambulatory Visit (INDEPENDENT_AMBULATORY_CARE_PROVIDER_SITE_OTHER): Payer: Self-pay | Admitting: Family Medicine

## 2022-04-25 ENCOUNTER — Other Ambulatory Visit: Payer: Self-pay

## 2022-04-26 ENCOUNTER — Other Ambulatory Visit: Payer: Self-pay

## 2022-04-30 ENCOUNTER — Encounter (INDEPENDENT_AMBULATORY_CARE_PROVIDER_SITE_OTHER): Payer: Self-pay | Admitting: Family Medicine

## 2022-04-30 ENCOUNTER — Other Ambulatory Visit: Payer: Self-pay

## 2022-04-30 ENCOUNTER — Ambulatory Visit (INDEPENDENT_AMBULATORY_CARE_PROVIDER_SITE_OTHER): Payer: Managed Care, Other (non HMO) | Admitting: Family Medicine

## 2022-04-30 VITALS — BP 96/64 | HR 66 | Temp 98.0°F | Ht 63.0 in | Wt 234.0 lb

## 2022-04-30 DIAGNOSIS — Z6841 Body Mass Index (BMI) 40.0 and over, adult: Secondary | ICD-10-CM

## 2022-04-30 DIAGNOSIS — R7303 Prediabetes: Secondary | ICD-10-CM

## 2022-04-30 DIAGNOSIS — F329 Major depressive disorder, single episode, unspecified: Secondary | ICD-10-CM | POA: Diagnosis not present

## 2022-04-30 DIAGNOSIS — E559 Vitamin D deficiency, unspecified: Secondary | ICD-10-CM | POA: Diagnosis not present

## 2022-04-30 MED ORDER — VITAMIN D (ERGOCALCIFEROL) 1.25 MG (50000 UNIT) PO CAPS: 50000.0000 [IU] | ORAL_CAPSULE | ORAL | 0 refills | Status: DC

## 2022-04-30 MED ORDER — BUPROPION HCL ER (XL) 300 MG PO TB24: 300.0000 mg | ORAL_TABLET | Freq: Every day | ORAL | 0 refills | Status: DC

## 2022-04-30 MED ORDER — WEGOVY 0.5 MG/0.5ML ~~LOC~~ SOAJ
0.5000 mg | SUBCUTANEOUS | 0 refills | Status: DC
  Filled 2022-04-30 – 2022-06-04 (×2): qty 2, 28d supply, fill #0

## 2022-04-30 NOTE — Assessment & Plan Note (Signed)
Improving Patient has seen 15 pounds of weight loss in the past 6 months of medically supervised weight management which is a 6% total body weight loss.  She does have room for improvement with regular exercise and has plans to start lifting weights at home and walking her dog more in her neighborhood.  We set a goal for at least 3 days a week.  Continue prescribed meal plan.

## 2022-04-30 NOTE — Assessment & Plan Note (Signed)
Last vitamin D Lab Results  Component Value Date   VD25OH 35.8 12/25/2021   Taking prescription vitamin D 50,000 IU once weekly.  Energy level is improving.  Plan to recheck vitamin D level today with a target goal 50-70

## 2022-04-30 NOTE — Progress Notes (Signed)
Office: [phone]  /  Fax: [phone]  WEIGHT SUMMARY AND BIOMETRICS  Vitals Temp: 48 F (36.7 C) BP: 96/64 Pulse Rate: 66 SpO2: 98 %   Anthropometric Measurements Height: '5\' 3"'$  (1.6 m) Weight: 234 lb (106.1 kg) BMI (Calculated): 41.46 Weight at Last Visit: 236lb Weight Lost Since Last Visit: 2 Starting Weight: 249lb Total Weight Loss (lbs): 15 lb (6.804 kg)   Body Composition  Body Fat %: 47.4 % Fat Mass (lbs): 111.2 lbs Muscle Mass (lbs): 117.4 lbs Total Body Water (lbs): 80.4 lbs Visceral Fat Rating : 15   Other Clinical Data Fasting: yes Labs: yes Today's Visit #: 8 Starting Date: 10/16/21     HPI  Chief Complaint: OBESITY  Mackenzie Newman is here to discuss her progress with her obesity treatment plan. She is on the the Category 3 Plan and states she is following her eating plan approximately 15 % of the time. She states she is not exercising.    Interval History:  Since last office visit she is down 2 lb since her last visit. She hasn't yet started [facility] yet. Sugar cravings have been worse She has skipped some meals Stress levels have been high Reviewed bioimpedance results  Pharmacotherapy: starting Wegovy  PHYSICAL EXAM:  Blood pressure 96/64, pulse 66, temperature 98 F (36.7 C), height '5\' 3"'$  (1.6 m), weight 234 lb (106.1 kg), SpO2 98 %. Body mass index is 41.45 kg/m.  General: She is overweight, cooperative, alert, well developed, and in no acute distress. PSYCH: Has normal mood, affect and thought process.   HEENT: EOMI, sclerae are anicteric. Lungs: Normal breathing effort, no conversational dyspnea. Extremities: No edema.  Neurologic: No gross sensory or motor deficits. No tremors or fasciculations noted.    DIAGNOSTIC DATA REVIEWED:  BMET    Component Value Date/Time   NA 141 12/25/2021 1611   K 3.3 (L) 12/25/2021 1611   CL 101 12/25/2021 1611   CO2 22 12/25/2021 1611   GLUCOSE 110 (H) 12/25/2021 1611   BUN 15 12/25/2021  1611   CREATININE 1.17 (H) 12/25/2021 1611   CALCIUM 9.7 12/25/2021 1611   GFRNONAA 54 (L) 01/06/2020 0759   GFRAA 63 01/06/2020 0759   Lab Results  Component Value Date   HGBA1C 5.8 (H) 11/20/2021   HGBA1C 5.6 06/23/2019   Lab Results  Component Value Date   INSULIN 25.2 (H) 10/16/2021   Lab Results  Component Value Date   TSH 3.070 10/16/2021   CBC    Component Value Date/Time   WBC 8.0 10/16/2021 0828   RBC 4.68 10/16/2021 0828   HGB 13.1 10/16/2021 0828   HCT 40.1 10/16/2021 0828   PLT 285 10/16/2021 0828   MCV 86 10/16/2021 0828   MCH 28.0 10/16/2021 0828   MCHC 32.7 10/16/2021 0828   RDW 12.9 10/16/2021 0828   Iron Studies No results found for: "IRON", "TIBC", "FERRITIN", "IRONPCTSAT" Lipid Panel     Component Value Date/Time   CHOL 206 (H) 12/25/2021 1611   TRIG 114 12/25/2021 1611   HDL 63 12/25/2021 1611   CHOLHDL 3.2 05/01/2017 0825   LDLCALC 123 (H) 12/25/2021 1611   Hepatic Function Panel     Component Value Date/Time   PROT 7.6 12/25/2021 1611   ALBUMIN 4.0 12/25/2021 1611   AST 29 12/25/2021 1611   ALT 50 (H) 12/25/2021 1611   ALKPHOS 116 12/25/2021 1611   BILITOT 0.3 12/25/2021 1611      Component Value Date/Time   TSH 3.070 10/16/2021 [id number]  Nutritional Lab Results  Component Value Date   VD25OH 35.8 12/25/2021   VD25OH 16.6 (L) 10/16/2021     ASSESSMENT AND PLAN  TREATMENT PLAN FOR OBESITY:  Recommended Dietary Goals  Mackenzie Newman is currently in the action stage of change. As such, her goal is to continue weight management plan. She has agreed to the Category 3 Plan.  Behavioral Intervention  We discussed the following Behavioral Modification Strategies today: increasing lean protein intake, increasing vegetables, increasing fiber rich foods, avoiding skipping meals, increasing water intake, and work on meal planning and easy cooking plans.  Additional resources provided today: NA  Recommended Physical Activity  Goals  Mackenzie Newman has been advised to work up to 150 minutes of moderate intensity aerobic activity a week and strengthening exercises 2-3 times per week for cardiovascular health, weight loss maintenance and preservation of muscle mass.   She has agreed to Will begin regular aerobic exercise 30 minutes, 3 times per week. Chosen activity walking and weightlifting.   Pharmacotherapy We discussed various medication options to help Mackenzie Newman with her weight loss efforts and we both agreed to starting [facility] once available at her pharmacy.  ASSOCIATED CONDITIONS ADDRESSED TODAY  Pre-diabetes Assessment & Plan: Lab Results  Component Value Date   HGBA1C 5.8 (H) 11/20/2021   Actively working on reducing intake of sugar though cravings have been worsened over the past month.  She has seen 6% total body weight loss over the past 6 months of medically supervised weight management.  Recheck A1c today.  Consider use of metformin.  Orders: -     Hemoglobin A1c -     Comprehensive metabolic panel  Vitamin D deficiency Assessment & Plan: Last vitamin D Lab Results  Component Value Date   VD25OH 35.8 12/25/2021   Taking prescription vitamin D 50,000 IU once weekly.  Energy level is improving.  Plan to recheck vitamin D level today with a target goal 50-70  Orders: -     Vitamin D (Ergocalciferol); Take 1 capsule (50,000 Units total) by mouth every 7 (seven) days.  Dispense: 12 capsule; Refill: 0 -     VITAMIN D 25 Hydroxy (Vit-D Deficiency, Fractures)  Major depressive disorder, remission status unspecified, unspecified whether recurrent Assessment & Plan: Mood has improved on Wellbutrin XL 300 mg once daily without adverse side effect.  This has helped with emotional eating and high stress levels.  Continue to work on improving sleep at night 8 hours, mindful eating and stress reduction.  Orders: -     buPROPion HCl ER (XL); Take 1 tablet (300 mg total) by mouth daily.  Dispense: 90 tablet;  Refill: 0  Morbid obesity ([place]) Assessment & Plan: Improving Patient has seen 15 pounds of weight loss in the past 6 months of medically supervised weight management which is a 6% total body weight loss.  She does have room for improvement with regular exercise and has plans to start lifting weights at home and walking her dog more in her neighborhood.  We set a goal for at least 3 days a week.  Continue prescribed meal plan.  Orders: -     Wegovy; Inject 0.5 mg into the skin once a week.  Dispense: 2 mL; Refill: 0  BMI 40.0-44.9, adult (HCC)      No follow-ups on file.[name] She was informed of the importance of frequent follow up visits to maximize her success with intensive lifestyle modifications for her multiple health conditions.   ATTESTASTION STATEMENTS:  Reviewed by clinician on  day of visit: allergies, medications, problem list, medical history, surgical history, family history, social history, and previous encounter notes.   I have personally spent 30 minutes total time today in preparation, patient care, nutritional counseling and documentation for this visit, including the following: review of clinical lab tests; review of medical tests/procedures/services.      Dell Ponto, DO

## 2022-04-30 NOTE — Assessment & Plan Note (Signed)
Lab Results  Component Value Date   HGBA1C 5.8 (H) 11/20/2021   Actively working on reducing intake of sugar though cravings have been worsened over the past month.  She has seen 6% total body weight loss over the past 6 months of medically supervised weight management.  Recheck A1c today.  Consider use of metformin.

## 2022-04-30 NOTE — Assessment & Plan Note (Signed)
Mood has improved on Wellbutrin XL 300 mg once daily without adverse side effect.  This has helped with emotional eating and high stress levels.  Continue to work on improving sleep at night 8 hours, mindful eating and stress reduction.

## 2022-05-01 LAB — COMPREHENSIVE METABOLIC PANEL
ALT: 18 IU/L (ref 0–32)
AST: 19 IU/L (ref 0–40)
Albumin/Globulin Ratio: 1.3 (ref 1.2–2.2)
Albumin: 4.1 g/dL (ref 3.8–4.9)
Alkaline Phosphatase: 87 IU/L (ref 44–121)
BUN/Creatinine Ratio: 17 (ref 9–23)
BUN: 23 mg/dL (ref 6–24)
Bilirubin Total: 0.7 mg/dL (ref 0.0–1.2)
CO2: 22 mmol/L (ref 20–29)
Calcium: 9.9 mg/dL (ref 8.7–10.2)
Chloride: 103 mmol/L (ref 96–106)
Creatinine, Ser: 1.37 mg/dL — ABNORMAL HIGH (ref 0.57–1.00)
Globulin, Total: 3.2 g/dL (ref 1.5–4.5)
Glucose: 100 mg/dL — ABNORMAL HIGH (ref 70–99)
Potassium: 4.2 mmol/L (ref 3.5–5.2)
Sodium: 142 mmol/L (ref 134–144)
Total Protein: 7.3 g/dL (ref 6.0–8.5)
eGFR: 46 mL/min/{1.73_m2} — ABNORMAL LOW (ref >59)

## 2022-05-01 LAB — HEMOGLOBIN A1C
Est. average glucose Bld gHb Est-mCnc: 117 mg/dL
Hgb A1c MFr Bld: 5.7 % — ABNORMAL HIGH (ref 4.8–5.6)

## 2022-05-01 LAB — VITAMIN D 25 HYDROXY (VIT D DEFICIENCY, FRACTURES): Vit D, 25-Hydroxy: 42.6 ng/mL (ref 30.0–100.0)

## 2022-05-02 ENCOUNTER — Encounter: Payer: Self-pay | Admitting: Family Medicine

## 2022-05-03 MED ORDER — LOSARTAN POTASSIUM-HCTZ 100-25 MG PO TABS: 1.0000 | ORAL_TABLET | Freq: Every day | ORAL | 1 refills | Status: DC

## 2022-05-10 ENCOUNTER — Other Ambulatory Visit: Payer: Self-pay

## 2022-05-17 ENCOUNTER — Other Ambulatory Visit: Payer: Self-pay

## 2022-05-18 ENCOUNTER — Other Ambulatory Visit: Payer: Self-pay

## 2022-05-30 ENCOUNTER — Other Ambulatory Visit: Payer: Self-pay | Admitting: Family Medicine

## 2022-05-30 DIAGNOSIS — K219 Gastro-esophageal reflux disease without esophagitis: Secondary | ICD-10-CM

## 2022-05-30 NOTE — Telephone Encounter (Signed)
Medication Refill - Medication: omeprazole (PRILOSEC) 20 MG capsule   Has the patient contacted their pharmacy? Yes.   (Agent: If no, request that the patient contact the pharmacy for the refill. If patient does not wish to contact the pharmacy document the reason why and proceed with request.) (Agent: If yes, when and what did the pharmacy advise?)  Preferred Pharmacy (with phone number or street name):  [facility] [id number], [place] [address]  [place] [address]  Phone: [phone] Fax: [phone]   Has the patient been seen for an appointment in the last year OR does the patient have an upcoming appointment? Yes.    Agent: Please be advised that RX refills may take up to 3 business days. We ask that you follow-up with your pharmacy.

## 2022-05-31 MED ORDER — OMEPRAZOLE 20 MG PO CPDR: DELAYED_RELEASE_CAPSULE | ORAL | 1 refills | Status: DC

## 2022-05-31 NOTE — Telephone Encounter (Signed)
Requested Prescriptions  Pending Prescriptions Disp Refills   omeprazole (PRILOSEC) 20 MG capsule 90 capsule 1    Sig: TAKE 1 CAPSULE(20 MG) BY MOUTH DAILY     Gastroenterology: Proton Pump Inhibitors Passed - 05/30/2022  2:24 PM      Passed - Valid encounter within last 12 months    Recent Outpatient Visits           4 months ago Essential hypertension   [place] [place] [employer] [place], Dionne Bucy, MD   5 months ago Encounter for annual physical exam   [place], Dionne Bucy, MD   11 months ago Essential (primary) hypertension   [place], Dionne Bucy, MD   1 year ago Need for shingles vaccine   [place], Dionne Bucy, MD   1 year ago Need for shingles vaccine   [place], Dionne Bucy, MD       Future Appointments             In 4 weeks [place], Dionne Bucy, MD [facility], [place]

## 2022-06-04 ENCOUNTER — Other Ambulatory Visit: Payer: Self-pay

## 2022-06-07 ENCOUNTER — Other Ambulatory Visit: Payer: Self-pay

## 2022-06-07 ENCOUNTER — Ambulatory Visit (INDEPENDENT_AMBULATORY_CARE_PROVIDER_SITE_OTHER): Payer: Managed Care, Other (non HMO) | Admitting: Family Medicine

## 2022-06-07 ENCOUNTER — Encounter (INDEPENDENT_AMBULATORY_CARE_PROVIDER_SITE_OTHER): Payer: Self-pay | Admitting: Family Medicine

## 2022-06-07 VITALS — BP 107/70 | HR 70 | Temp 98.0°F | Ht 63.0 in | Wt 235.0 lb

## 2022-06-07 DIAGNOSIS — R7989 Other specified abnormal findings of blood chemistry: Secondary | ICD-10-CM | POA: Diagnosis not present

## 2022-06-07 DIAGNOSIS — Z6841 Body Mass Index (BMI) 40.0 and over, adult: Secondary | ICD-10-CM

## 2022-06-07 DIAGNOSIS — R7303 Prediabetes: Secondary | ICD-10-CM | POA: Diagnosis not present

## 2022-06-07 DIAGNOSIS — F329 Major depressive disorder, single episode, unspecified: Secondary | ICD-10-CM

## 2022-06-07 DIAGNOSIS — E559 Vitamin D deficiency, unspecified: Secondary | ICD-10-CM | POA: Diagnosis not present

## 2022-06-07 DIAGNOSIS — R748 Abnormal levels of other serum enzymes: Secondary | ICD-10-CM | POA: Insufficient documentation

## 2022-06-07 MED ORDER — WEGOVY 1 MG/0.5ML ~~LOC~~ SOAJ
1.0000 mg | SUBCUTANEOUS | 0 refills | Status: DC
  Filled 2022-06-07 – 2022-07-06 (×4): qty 2, 28d supply, fill #0

## 2022-06-07 NOTE — Assessment & Plan Note (Signed)
Last vitamin D Lab Results  Component Value Date   VD25OH 42.6 04/30/2022   Reviewed labs from last visit.  Vitamin D level is improving to 42.6, up from 35.8.  Reviewed target vitamin D level 50-70.  Energy level is starting to improve.  She denies adverse side effects from her taking prescription vitamin D 50,000 IU once weekly.  Plan continue prescription vitamin D 50,000 IU once weekly.  Recheck level in 3 months.

## 2022-06-07 NOTE — Assessment & Plan Note (Signed)
Reviewed labs from last visit.  Her serum creatinine was slightly up at 1.39 with an estimated GFR of 46.  She has been taking prescription Naprosyn on occasion for arthritis pain.  Avoid use of over-the-counter NSAIDs especially on days taking prescription Naprosyn for arthritis pain.  Hydrate well with water.  Recheck BMP next visit.

## 2022-06-07 NOTE — Progress Notes (Signed)
Office: [phone]  /  Fax: [place]  Starting Date: 10/16/21  Starting Weight: 249lb   Weight Lost Since Last Visit: 0   Vitals Temp: 98 F (36.7 C) BP: 107/70 Pulse Rate: 70 SpO2: 97 %   Body Composition  Body Fat %: 48.4 % Fat Mass (lbs): 113.8 lbs Muscle Mass (lbs): 115.2 lbs Total Body Water (lbs): 82.2 lbs Visceral Fat Rating : 15    HPI  Chief Complaint: OBESITY  Mackenzie Newman is here to discuss her progress with her obesity treatment plan. She is on the the Category 3 Plan and states she is following her eating plan approximately 50 % of the time. She states she is exercising 30 minutes 4 times per week.   Interval History:  Since last office visit she is up 1 lb She has a net weight loss of 14 lb  in 7 mos She is typically eating a late lunch and then dinner Sugar cravings have worsened Works from home She eats 2 eggs with or without toast or she will have plain steel cut oats with fruit and cinnamon She does feel improved volume on Wegovy 0.25 mg She is eating out 2-3 meals/ week She is typically having a lean meat and veggies with dinner She has been snacking on milk dudds at night and has drank some soda  Pharmacotherapy: Wegovy 0.25 mg weekly  PHYSICAL EXAM:  Blood pressure 107/70, pulse 70, temperature 98 F (36.7 C), height 5\' 3"  (1.6 m), weight 235 lb (106.6 kg), SpO2 97 %. Body mass index is 41.63 kg/m.  General: She is overweight, cooperative, alert, well developed, and in no acute distress. PSYCH: Has normal mood, affect and thought process.   Lungs: Normal breathing effort, no conversational dyspnea.   ASSESSMENT AND PLAN  TREATMENT PLAN FOR OBESITY:  Recommended Dietary Goals  Mackenzie Newman is currently in the action stage of change. As such, her goal is to continue weight management plan. She has agreed to the Category 3 Plan.  Behavioral Intervention  We discussed the following Behavioral  Modification Strategies today: increasing lean protein intake, decreasing simple carbohydrates , increasing vegetables, increasing lower glycemic fruits, increasing fiber rich foods, avoiding skipping meals, increasing water intake, work on meal planning and preparation, work on managing stress, creating time for self-care and relaxation measures, and avoiding temptations and identifying enticing environmental cues.  Additional resources provided today: NA  Recommended Physical Activity Goals  [name] has been advised to work up to 150 minutes of moderate intensity aerobic activity a week and strengthening exercises 2-3 times per week for cardiovascular health, weight loss maintenance and preservation of muscle mass.   She has agreed to Work on scheduling and tracking physical activity.   Pharmacotherapy changes for the treatment of obesity: Wegovy increasing to 0.5 mg weekly  ASSOCIATED CONDITIONS ADDRESSED TODAY  Pre-diabetes Assessment & Plan: Lab Results  Component Value Date   HGBA1C 5.7 (H) 04/30/2022   Reviewed labs from last visit.  Her A1c was 5.7, down from previous level of 5.8. She has lost 14 pounds in the past 7 months.  She is working on dietary changes on her prescribed meal plan.  Plan continue prescribed dietary plan, reducing refined carbohydrates and added sugar.  Plan to increase walking time.  Look for improvements with increasing dose of Wegovy to 0.5 mg once weekly injection.   Vitamin D deficiency Assessment & Plan: Last vitamin D Lab Results  Component Value Date   VD25OH  42.6 04/30/2022   Reviewed labs from last visit.  Vitamin D level is improving to 42.6, up from 35.8.  Reviewed target vitamin D level 50-70.  Energy level is starting to improve.  She denies adverse side effects from her taking prescription vitamin D 50,000 IU once weekly.  Plan continue prescription vitamin D 50,000 IU once weekly.  Recheck level in 3 months.   Morbid obesity  BMI  40.0-44.9, adult  Elevated serum creatinine Assessment & Plan: Reviewed labs from last visit.  Her serum creatinine was slightly up at 1.39 with an estimated GFR of 46.  She has been taking prescription Naprosyn on occasion for arthritis pain.  Avoid use of over-the-counter NSAIDs especially on days taking prescription Naprosyn for arthritis pain.  Hydrate well with water.  Recheck BMP next visit.   Major depressive disorder, remission status unspecified, unspecified whether recurrent Assessment & Plan: Improving on Wellbutrin XL 300 mg once daily.  This is also help with emotional eating.  She denies any adverse side effects.  She has a good support system.  Continue Wellbutrin XL 300 mg once daily.   Elevated liver enzymes Assessment & Plan: Reviewed labs from last visit.  Her liver enzymes have normalized.  No further action needed.   Other orders -     Wegovy; Inject 1 mg into the skin once a week.  Dispense: 2 mL; Refill: 0      She was informed of the importance of frequent follow up visits to maximize her success with intensive lifestyle modifications for her multiple health conditions.   ATTESTASTION STATEMENTS:  Reviewed by clinician on day of visit: allergies, medications, problem list, medical history, surgical history, family history, social history, and previous encounter notes pertinent to obesity diagnosis.   I have personally spent 30 minutes total time today in preparation, patient care, nutritional counseling and documentation for this visit, including the following: review of clinical lab tests; review of medical tests/procedures/services.      Dell Ponto, DO DABFM, DABOM [facility] [address] [place], [place] [phone]

## 2022-06-07 NOTE — Assessment & Plan Note (Signed)
Improving on Wellbutrin XL 300 mg once daily.  This is also help with emotional eating.  She denies any adverse side effects.  She has a good support system.  Continue Wellbutrin XL 300 mg once daily.

## 2022-06-07 NOTE — Assessment & Plan Note (Signed)
Reviewed labs from last visit.  Her liver enzymes have normalized.  No further action needed.

## 2022-06-07 NOTE — Assessment & Plan Note (Signed)
Lab Results  Component Value Date   HGBA1C 5.7 (H) 04/30/2022   Reviewed labs from last visit.  Her A1c was 5.7, down from previous level of 5.8. She has lost 14 pounds in the past 7 months.  She is working on dietary changes on her prescribed meal plan.  Plan continue prescribed dietary plan, reducing refined carbohydrates and added sugar.  Plan to increase walking time.  Look for improvements with increasing dose of Wegovy to 0.5 mg once weekly injection.

## 2022-06-22 ENCOUNTER — Other Ambulatory Visit: Payer: Self-pay

## 2022-06-29 ENCOUNTER — Encounter: Payer: Self-pay | Admitting: Family Medicine

## 2022-06-29 ENCOUNTER — Ambulatory Visit: Payer: Managed Care, Other (non HMO) | Admitting: Family Medicine

## 2022-06-29 VITALS — BP 113/73 | HR 74 | Temp 98.4°F | Resp 16 | Wt 234.2 lb

## 2022-06-29 DIAGNOSIS — I10 Essential (primary) hypertension: Secondary | ICD-10-CM

## 2022-06-29 DIAGNOSIS — E7800 Pure hypercholesterolemia, unspecified: Secondary | ICD-10-CM

## 2022-06-29 DIAGNOSIS — R7303 Prediabetes: Secondary | ICD-10-CM | POA: Diagnosis not present

## 2022-06-29 MED ORDER — GUAIFENESIN-CODEINE 100-10 MG/5ML PO SYRP: 5.0000 mL | ORAL_SOLUTION | Freq: Three times a day (TID) | ORAL | 0 refills | Status: DC | PRN

## 2022-06-29 MED ORDER — ZOLPIDEM TARTRATE 10 MG PO TABS: 10.0000 mg | ORAL_TABLET | Freq: Every evening | ORAL | 1 refills | Status: DC | PRN

## 2022-06-29 NOTE — Assessment & Plan Note (Signed)
Reviewed last lipid panel Not currently on a statin Recheck FLP and CMP annually - due at CPE Discussed diet and exercise

## 2022-06-29 NOTE — Assessment & Plan Note (Signed)
Stable Reviewed recent A1c

## 2022-06-29 NOTE — Assessment & Plan Note (Signed)
Discussed importance of healthy weight management Discussed diet and exercise F/b [facility]

## 2022-06-29 NOTE — Assessment & Plan Note (Signed)
Well controlled Continue current medications Reviewed recent metabolic panel F/u in 6 months  

## 2022-06-29 NOTE — Progress Notes (Signed)
I,Mackenzie Newman,acting as a scribe for Shirlee Latch, MD.,have documented all relevant documentation on the behalf of Shirlee Latch, MD,as directed by  Shirlee Latch, MD while in the presence of Shirlee Latch, MD.   Established patient visit   [name]   DOB: 1965-07-17   57 y.o. Female  MRN: [id number] Visit Date: 06/29/2022  Today's healthcare provider: Shirlee Latch, MD   Chief Complaint  Patient presents with   follow-Up HTN   Subjective    HPI  Hypertension, follow-up  BP Readings from Last 3 Encounters:  06/29/22 113/73  06/07/22 107/70  04/30/22 96/64   Wt Readings from Last 3 Encounters:  06/29/22 234 lb 3.2 oz (106.2 kg)  06/07/22 235 lb (106.6 kg)  04/30/22 234 lb (106.1 kg)     She was last seen for hypertension 6 months ago.  BP at that visit was 121/71. Management since that visit includes continue current medications - will switch to combo pill of losartan-hctz at current doses .  She reports excellent compliance with treatment. She is not having side effects.   Outside blood pressures are not being checked. Symptoms: No chest pain No chest pressure  No palpitations No syncope  No dyspnea No orthopnea  No paroxysmal nocturnal dyspnea No lower extremity edema   Pertinent labs Lab Results  Component Value Date   CHOL 206 (H) 12/25/2021   HDL 63 12/25/2021   LDLCALC 123 (H) 12/25/2021   TRIG 114 12/25/2021   CHOLHDL 3.2 05/01/2017   Lab Results  Component Value Date   NA 142 04/30/2022   K 4.2 04/30/2022   CREATININE 1.37 (H) 04/30/2022   EGFR 46 (L) 04/30/2022   GLUCOSE 100 (H) 04/30/2022   TSH 3.070 10/16/2021     The 10-year ASCVD risk score (Arnett DK, et al., 2019) is: 1.9%  ---------------------------------------------------------------------------------------------------  Pre-diabetes: Patient is followed by [facility]. Hemoglobin A1C 2 months ago was 5.7%.  Insomnia: stable  on Ambien 10 mg.  Medications: Outpatient Medications Prior to Visit  Medication Sig   buPROPion (WELLBUTRIN XL) 300 MG 24 hr tablet Take 1 tablet (300 mg total) by mouth daily.   clotrimazole-betamethasone (LOTRISONE) cream Apply 1 application topically 2 (two) times daily.   losartan-hydrochlorothiazide (HYZAAR) 100-25 MG tablet Take 1 tablet by mouth daily.   naproxen (NAPROSYN) 500 MG tablet TAKE 1 TABLET(500 MG) BY MOUTH TWICE DAILY AS NEEDED   omeprazole (PRILOSEC) 20 MG capsule TAKE 1 CAPSULE(20 MG) BY MOUTH DAILY   Semaglutide-Weight Management (WEGOVY) 0.5 MG/0.5ML SOAJ Inject 0.5 mg into the skin once a week.   Semaglutide-Weight Management (WEGOVY) 1 MG/0.5ML SOAJ Inject 1 mg into the skin once a week.   Vitamin D, Ergocalciferol, (DRISDOL) 1.25 MG (50000 UNIT) CAPS capsule Take 1 capsule (50,000 Units total) by mouth every 7 (seven) days.   zolpidem (AMBIEN) 10 MG tablet Take 1 tablet (10 mg total) by mouth at bedtime as needed. for sleep   fluorouracil (EFUDEX) 5 % cream Apply 1 Application topically 2 (two) times daily.   guaiFENesin-codeine (ROBITUSSIN AC) 100-10 MG/5ML syrup Take 5 mLs by mouth 3 (three) times daily as needed for cough.   No facility-administered medications prior to visit.    Review of Systems per HPI     Objective    BP 113/73 (BP Location: Left Arm, Patient Position: Sitting, Cuff Size: Large)   Pulse 74   Temp 98.4 F (36.9 C) (Oral)   Resp 16  Wt 234 lb 3.2 oz (106.2 kg)   BMI 41.49 kg/m    Physical Exam Vitals reviewed.  Constitutional:      General: She is not in acute distress.    Appearance: Normal appearance. She is well-developed. She is not diaphoretic.  HENT:     Head: Normocephalic and atraumatic.  Eyes:     General: No scleral icterus.    Conjunctiva/sclera: Conjunctivae normal.  Neck:     Thyroid: No thyromegaly.  Cardiovascular:     Rate and Rhythm: Normal rate and regular rhythm.     Pulses: Normal pulses.     Heart  sounds: Normal heart sounds. No murmur heard. Pulmonary:     Effort: Pulmonary effort is normal. No respiratory distress.     Breath sounds: Normal breath sounds. No wheezing, rhonchi or rales.  Musculoskeletal:     Cervical back: Neck supple.     Right lower leg: No edema.     Left lower leg: No edema.  Lymphadenopathy:     Cervical: No cervical adenopathy.  Skin:    General: Skin is warm and dry.     Findings: No rash.  Neurological:     Mental Status: She is alert and oriented to person, place, and time. Mental status is at baseline.  Psychiatric:        Mood and Affect: Mood normal.        Behavior: Behavior normal.       No results found for any visits on 06/29/22.  Assessment & Plan     Problem List Items Addressed This Visit       Cardiovascular and Mediastinum   Essential hypertension - Primary    Well controlled Continue current medications Reviewed recent metabolic panel F/u in 6 months         Other   Morbid obesity (HCC)    Discussed importance of healthy weight management Discussed diet and exercise F/b [facility]      Hyperlipidemia    Reviewed last lipid panel Not currently on a statin Recheck FLP and CMP annually - due at CPE Discussed diet and exercise       Pre-diabetes    Stable Reviewed recent A1c        No follow-ups on file.      I, Shirlee Latch, MD, have reviewed all documentation for this visit. The documentation on 06/29/22 for the exam, diagnosis, procedures, and orders are all accurate and complete.   Tameaka Eichhorn, Marzella Schlein, MD, MPH [facility]

## 2022-07-04 ENCOUNTER — Other Ambulatory Visit: Payer: Self-pay

## 2022-07-04 ENCOUNTER — Encounter: Payer: Self-pay | Admitting: Pharmacist

## 2022-07-05 ENCOUNTER — Other Ambulatory Visit: Payer: Self-pay

## 2022-07-06 ENCOUNTER — Other Ambulatory Visit: Payer: Self-pay

## 2022-07-09 ENCOUNTER — Other Ambulatory Visit: Payer: Self-pay

## 2022-07-10 ENCOUNTER — Encounter (INDEPENDENT_AMBULATORY_CARE_PROVIDER_SITE_OTHER): Payer: Self-pay | Admitting: Family Medicine

## 2022-07-10 ENCOUNTER — Other Ambulatory Visit: Payer: Self-pay

## 2022-07-10 ENCOUNTER — Ambulatory Visit (INDEPENDENT_AMBULATORY_CARE_PROVIDER_SITE_OTHER): Payer: Managed Care, Other (non HMO) | Admitting: Family Medicine

## 2022-07-10 VITALS — BP 109/72 | HR 73 | Temp 98.1°F | Ht 63.0 in | Wt 230.0 lb

## 2022-07-10 DIAGNOSIS — R7303 Prediabetes: Secondary | ICD-10-CM

## 2022-07-10 DIAGNOSIS — F329 Major depressive disorder, single episode, unspecified: Secondary | ICD-10-CM

## 2022-07-10 DIAGNOSIS — R7989 Other specified abnormal findings of blood chemistry: Secondary | ICD-10-CM

## 2022-07-10 DIAGNOSIS — E559 Vitamin D deficiency, unspecified: Secondary | ICD-10-CM

## 2022-07-10 DIAGNOSIS — Z6841 Body Mass Index (BMI) 40.0 and over, adult: Secondary | ICD-10-CM

## 2022-07-10 MED ORDER — WEGOVY 1 MG/0.5ML ~~LOC~~ SOAJ
1.0000 mg | SUBCUTANEOUS | 0 refills | Status: DC
  Filled 2022-07-10 – 2022-07-29 (×2): qty 2, 28d supply, fill #0

## 2022-07-10 MED ORDER — BUPROPION HCL ER (XL) 300 MG PO TB24: 300.0000 mg | ORAL_TABLET | Freq: Every day | ORAL | 0 refills | Status: DC

## 2022-07-10 MED ORDER — VITAMIN D (ERGOCALCIFEROL) 1.25 MG (50000 UNIT) PO CAPS: 50000.0000 [IU] | ORAL_CAPSULE | ORAL | 0 refills | Status: DC

## 2022-07-10 NOTE — Assessment & Plan Note (Signed)
Lab Results  Component Value Date   NA 142 04/30/2022   K 4.2 04/30/2022   CO2 22 04/30/2022   GLUCOSE 100 (H) 04/30/2022   BUN 23 04/30/2022   CREATININE 1.37 (H) 04/30/2022   CALCIUM 9.9 04/30/2022   EGFR 46 (L) 04/30/2022   GFRNONAA 54 (L) 01/06/2020    She has stopped taking OTC NSAIDs.  She has improved hydration with water.  She denies leg edema or problems urinating.  Recheck BMP next visit

## 2022-07-10 NOTE — Progress Notes (Signed)
Office: [phone]  /  Fax: [phone]  WEIGHT SUMMARY AND BIOMETRICS  Starting Date: 10/16/21  Starting Weight: 249lb   Weight Lost Since Last Visit: 5lb   Vitals Temp: 98.1 F (36.7 C) BP: 109/72 Pulse Rate: 73 SpO2: 93 %   Body Composition  Body Fat %: 46.8 % Fat Mass (lbs): 108 lbs Muscle Mass (lbs): 116.4 lbs Total Body Water (lbs): 79.6 lbs Visceral Fat Rating : 15     HPI  Chief Complaint: OBESITY  Mackenzie Newman is here to discuss her progress with her obesity treatment plan. She is on the the Category 3 Plan and states she is following her eating plan approximately 30 % of the time. She states she is exercising 0 minutes 0 times per week.   Interval History:  Since last office visit she is down 5 lb She increased her Wegovy to 1 mg last week She is eating breakfast late, eating smaller portion sizes She did increase her muscle mass to 1.2 lb and lost 5.8 lb of body fat in the past month This gives her a net weight loss of 19 lb in 5 mos She has started [employer] online (needed to start for her insurance for [employer] with her insurance) She plans to get some free weights for home    Pharmacotherapy: Wegovy 1 mg  PHYSICAL EXAM:  Blood pressure 109/72, pulse 73, temperature 98.1 F (36.7 C), height 5\' 3"  (1.6 m), weight 230 lb (104.3 kg), SpO2 93 %. Body mass index is 40.74 kg/m.  General: She is overweight, cooperative, alert, well developed, and in no acute distress. PSYCH: Has normal mood, affect and thought process.   Lungs: Normal breathing effort, no conversational dyspnea.   ASSESSMENT AND PLAN  TREATMENT PLAN FOR OBESITY:  Recommended Dietary Goals  Mackenzie Newman is currently in the action stage of change. As such, her goal is to continue weight management plan. She has agreed to practicing portion control and making smarter food choices, such as increasing vegetables and decreasing simple carbohydrates.  Behavioral Intervention  We discussed the  following Behavioral Modification Strategies today: increasing lean protein intake, decreasing simple carbohydrates , increasing vegetables, increasing lower glycemic fruits, increasing fiber rich foods, increasing water intake, work on tracking and journaling calories using tracking application, continue to work on implementation of reduced calorie nutritional plan, continue to practice mindfulness when eating, and planning for success.  Additional resources provided today: NA  Recommended Physical Activity Goals  Mackenzie Newman has been advised to work up to 150 minutes of moderate intensity aerobic activity a week and strengthening exercises 2-3 times per week for cardiovascular health, weight loss maintenance and preservation of muscle mass.   She has agreed to Think about ways to increase physical activity  Pharmacotherapy changes for the treatment of obesity:  continue Wegovy 1 mg weekly  ASSOCIATED CONDITIONS ADDRESSED TODAY  Pre-diabetes Assessment & Plan: Lab Results  Component Value Date   HGBA1C 5.7 (H) 04/30/2022   She is doing well with a low sugar diet, weight reduction and has plans to increase exercise time. Anticipate improvements while on GLP-1 receptor agonist.  Recheck A1c next visit   Vitamin D deficiency Assessment & Plan: Last vitamin D Lab Results  Component Value Date   VD25OH 42.6 04/30/2022     Orders: -     Vitamin D (Ergocalciferol); Take 1 capsule (50,000 Units total) by mouth every 7 (seven) days.  Dispense: 12 capsule; Refill: 0  Major depressive disorder, remission status unspecified, unspecified whether  recurrent -     buPROPion HCl ER (XL); Take 1 tablet (300 mg total) by mouth daily.  Dispense: 90 tablet; Refill: 0  Morbid obesity with starting BMI 44 Assessment & Plan: Reviewed bioimpedence results with patient. She has maintained almost all of her lean muscle mass in the past 8 mos (down 2 lb) and continues to lose mostly body fat with a  reduced calorie diet and use of Wegovy.  She has tolerated dose increases well without adverse SE.  She has room for improvement with exercise and is motivated to do more.    Continue [employer] platform for logging/ food intake Encouraged increase walking time, use of a smart watch and starting use of free weights at home 2-3 x a week. Continue Wegovy 1 mg weekly injection  Orders: -     ZOXWRU; Inject 1 mg into the skin once a week.  Dispense: 2 mL; Refill: 0  Elevated serum creatinine Assessment & Plan: Lab Results  Component Value Date   NA 142 04/30/2022   K 4.2 04/30/2022   CO2 22 04/30/2022   GLUCOSE 100 (H) 04/30/2022   BUN 23 04/30/2022   CREATININE 1.37 (H) 04/30/2022   CALCIUM 9.9 04/30/2022   EGFR 46 (L) 04/30/2022   GFRNONAA 54 (L) 01/06/2020    She has stopped taking OTC NSAIDs.  She has improved hydration with water.  She denies leg edema or problems urinating.  Recheck BMP next visit       She was informed of the importance of frequent follow up visits to maximize her success with intensive lifestyle modifications for her multiple health conditions.   ATTESTASTION STATEMENTS:  Reviewed by clinician on day of visit: allergies, medications, problem list, medical history, surgical history, family history, social history, and previous encounter notes pertinent to obesity diagnosis.   I have personally spent 30 minutes total time today in preparation, patient care, nutritional counseling and documentation for this visit, including the following: review of clinical lab tests; review of medical tests/procedures/services.      Glennis Brink, DO DABFM, DABOM [facility] [address] [place], [place] [address] [phone]

## 2022-07-10 NOTE — Assessment & Plan Note (Signed)
Reviewed bioimpedence results with patient. She has maintained almost all of her lean muscle mass in the past 8 mos (down 2 lb) and continues to lose mostly body fat with a reduced calorie diet and use of Wegovy.  She has tolerated dose increases well without adverse SE.  She has room for improvement with exercise and is motivated to do more.    Continue [employer] platform for logging/ food intake Encouraged increase walking time, use of a smart watch and starting use of free weights at home 2-3 x a week. Continue Wegovy 1 mg weekly injection

## 2022-07-10 NOTE — Assessment & Plan Note (Signed)
Last vitamin D Lab Results  Component Value Date   VD25OH 42.6 04/30/2022

## 2022-07-10 NOTE — Assessment & Plan Note (Signed)
Lab Results  Component Value Date   HGBA1C 5.7 (H) 04/30/2022   She is doing well with a low sugar diet, weight reduction and has plans to increase exercise time. Anticipate improvements while on GLP-1 receptor agonist.  Recheck A1c next visit

## 2022-07-30 ENCOUNTER — Other Ambulatory Visit: Payer: Self-pay

## 2022-07-31 ENCOUNTER — Encounter: Payer: Self-pay | Admitting: Physician Assistant

## 2022-07-31 ENCOUNTER — Ambulatory Visit: Payer: Self-pay

## 2022-07-31 ENCOUNTER — Ambulatory Visit: Payer: Managed Care, Other (non HMO) | Admitting: Physician Assistant

## 2022-07-31 VITALS — BP 120/70 | HR 90 | Temp 98.0°F | Resp 16 | Ht 63.0 in | Wt 224.0 lb

## 2022-07-31 DIAGNOSIS — J069 Acute upper respiratory infection, unspecified: Secondary | ICD-10-CM | POA: Diagnosis not present

## 2022-07-31 MED ORDER — GUAIFENESIN-CODEINE 100-10 MG/5ML PO SYRP: 5.0000 mL | ORAL_SOLUTION | Freq: Three times a day (TID) | ORAL | 0 refills | Status: DC | PRN

## 2022-07-31 MED ORDER — PREDNISONE 20 MG PO TABS: ORAL_TABLET | ORAL | 0 refills | Status: DC

## 2022-07-31 MED ORDER — AZITHROMYCIN 250 MG PO TABS: ORAL_TABLET | ORAL | 0 refills | Status: DC

## 2022-07-31 NOTE — Telephone Encounter (Signed)
Noted  

## 2022-07-31 NOTE — Progress Notes (Signed)
Acute Office Visit   [name]   DOB: 04-Aug-1965   57 y.o. Female  MRN: [id number] Visit Date: 07/31/2022  Today's healthcare provider: Oswaldo Conroy Phuong Hillary, PA-C  Introduced myself to the patient as a Secondary school teacher and provided education on APPs in clinical practice.    Chief Complaint  Patient presents with   Cough    X2 weeks, productive. OTC meds not helping.   Subjective    HPI HPI     Cough    Additional comments: X2 weeks, productive. OTC meds not helping.      Last edited by Dollene Primrose, CMA on 07/31/2022  2:26 PM.       Cough  Onset: gradual  Duration: about 2 weeks Reports she was treated for Strep around 07/18/22 and developed a cough after she finished her abx She reports a productive, persistent cough with green sputum, fatigue, nasal congestion, runny nose,  She reports taking a deep breath can make her start coughing Interventions: Delsym, codeine cough syrup from PCP, Tessalon pearls, Zyrtec, Prednisone She was seen by telemed through her work who provided the [employer], Zyrtec and Prednisone - these were started on 07/29/22 COVID testing at home: none Recent sick contacts: granddaughter has also been sick from day care  She denies previous hx of asthma or breathing conditions     Medications: Outpatient Medications Prior to Visit  Medication Sig   buPROPion (WELLBUTRIN XL) 300 MG 24 hr tablet Take 1 tablet (300 mg total) by mouth daily.   clotrimazole-betamethasone (LOTRISONE) cream Apply 1 application topically 2 (two) times daily.   fluorouracil (EFUDEX) 5 % cream Apply 1 Application topically 2 (two) times daily.   losartan-hydrochlorothiazide (HYZAAR) 100-25 MG tablet Take 1 tablet by mouth daily.   naproxen (NAPROSYN) 500 MG tablet TAKE 1 TABLET(500 MG) BY MOUTH TWICE DAILY AS NEEDED (Patient not taking: Reported on 08/04/2022)   omeprazole (PRILOSEC) 20 MG capsule TAKE 1 CAPSULE(20 MG) BY MOUTH DAILY   Semaglutide-Weight Management  (WEGOVY) 1 MG/0.5ML SOAJ Inject 1 mg into the skin once a week.   Vitamin D, Ergocalciferol, (DRISDOL) 1.25 MG (50000 UNIT) CAPS capsule Take 1 capsule (50,000 Units total) by mouth every 7 (seven) days.   zolpidem (AMBIEN) 10 MG tablet Take 1 tablet (10 mg total) by mouth at bedtime as needed. for sleep   [DISCONTINUED] guaiFENesin-codeine (ROBITUSSIN AC) 100-10 MG/5ML syrup Take 5 mLs by mouth 3 (three) times daily as needed for cough.   No facility-administered medications prior to visit.    Review of Systems  Constitutional:  Positive for diaphoresis and fatigue. Negative for chills and fever.  HENT:  Positive for congestion, nosebleeds, postnasal drip, rhinorrhea and sore throat.   Respiratory:  Positive for cough. Negative for shortness of breath and wheezing.   Neurological:  Positive for headaches.       Objective    BP 120/70   Pulse 90   Temp 98 F (36.7 C) (Oral)   Resp 16   Ht 5\' 3"  (1.6 m)   Wt 224 lb (101.6 kg)   SpO2 93%   BMI 39.68 kg/m    Physical Exam Vitals reviewed.  Constitutional:      General: She is awake.     Appearance: Normal appearance. She is well-developed and well-groomed.  HENT:     Head: Normocephalic and atraumatic.     Right Ear: Hearing, tympanic membrane and ear canal normal.     Left  Ear: Hearing, tympanic membrane and ear canal normal.     Nose: Congestion and rhinorrhea present. Rhinorrhea is purulent.     Mouth/Throat:     Lips: Pink.     Mouth: Mucous membranes are moist.     Pharynx: Oropharynx is clear. Uvula midline. Posterior oropharyngeal erythema present. No pharyngeal swelling or oropharyngeal exudate.     Comments: Hoarse voice appreciated during exam   Eyes:     General: Lids are normal. Gaze aligned appropriately.  Cardiovascular:     Rate and Rhythm: Normal rate and regular rhythm.     Pulses: Normal pulses.     Heart sounds: No murmur heard.    No friction rub. No gallop.  Pulmonary:     Effort: Pulmonary  effort is normal.     Breath sounds: Decreased air movement present. Rhonchi present. No decreased breath sounds, wheezing or rales.  Musculoskeletal:     Cervical back: Normal range of motion and neck supple.     Right lower leg: No edema.     Left lower leg: No edema.  Lymphadenopathy:     Head:     Right side of head: No submental, submandibular or preauricular adenopathy.     Left side of head: No submental, submandibular or preauricular adenopathy.     Cervical:     Right cervical: No superficial or posterior cervical adenopathy.    Left cervical: No superficial or posterior cervical adenopathy.     Upper Body:     Right upper body: No supraclavicular adenopathy.     Left upper body: No supraclavicular adenopathy.  Neurological:     Mental Status: She is alert.  Psychiatric:        Behavior: Behavior is cooperative.       No results found for any visits on 07/31/22.  Assessment & Plan      No follow-ups on file.       Problem List Items Addressed This Visit   None Visit Diagnoses     Upper respiratory tract infection, unspecified type    -  Primary Acute, new concern Patient reports persistent productive cough for the last 2 weeks that is not improving with home measures. We reviewed her previous telemed visits that she had been at her work.  I also reviewed her medications from these encounters. Given her symptoms and lack of resolution we will start a Z-Pak along with prednisone taper with the goal of improving cough and reducing likely pulmonary inflammation Will also provide a refill of codeine cough syrup to assist with cough especially at nighttime Reviewed return and ED precautions Follow-up as needed for persistent or progressing symptoms   Relevant Medications   predniSONE (DELTASONE) 20 MG tablet   azithromycin (ZITHROMAX) 250 MG tablet   guaiFENesin-codeine (ROBITUSSIN AC) 100-10 MG/5ML syrup        No follow-ups on file.   I, Panayiotis Rainville E Beata Beason,  PA-C, have reviewed all documentation for this visit. The documentation on 08/06/22 for the exam, diagnosis, procedures, and orders are all accurate and complete.   Jacquelin Hawking, MHS, PA-C [facility]

## 2022-07-31 NOTE — Telephone Encounter (Signed)
Message from Randol Kern sent at 07/31/2022  8:45 AM EDT  Summary: Needs an appt, severe cold symptoms   Pt called requesting to have an appt today, would prefer to see her PCP but just needs an appt today. She has severe cold symptoms, has done 2 telehealth appts. She also wants her husband to be scheduled as well at the same time.  Pt is hoping to get worked in to the schedule at some point today.  She used all of the cough medicine with codeine and it did not help, says Dr. B prescribed that for her a month ago         Chief Complaint: 15 days cough Symptoms: hoarse, green phlegm, coughing so hard urinates on self  Frequency: 15 days  Pertinent Negatives: Patient denies fever, SOB, chest pain  Disposition: [] ED /[] Urgent Care (no appt availability in office) / [x] Appointment(In office/virtual)/ []  [facility]/ [] Home Care/ [] Refused Recommended Disposition /[] [facility]/ []  Follow-up with PCP Additional Notes: no appts at [facility] /appt made with Erin Mecum at [facility]- address and suite number given  Reason for Disposition  SEVERE coughing spells (e.g., whooping sound after coughing, vomiting after coughing)  Answer Assessment - Initial Assessment Questions 1. ONSET: "When did the cough begin?"      07/18/22 2. SEVERITY: "How bad is the cough today?"      Frequent causing her to urinate on herself  3. SPUTUM: "Describe the color of your sputum" (none, dry cough; clear, white, yellow, green)     Green  4. HEMOPTYSIS: "Are you coughing up any blood?" If so ask: "How much?" (flecks, streaks, tablespoons, etc.)     no 5. DIFFICULTY BREATHING: "Are you having difficulty breathing?" If Yes, ask: "How bad is it?" (e.g., mild, moderate, severe)    - MILD: No SOB at rest, mild SOB with walking, speaks normally in sentences, can lie down, no retractions, pulse < 100.    - MODERATE: SOB at rest, SOB with minimal exertion and prefers to sit, cannot lie down flat,  speaks in phrases, mild retractions, audible wheezing, pulse 100-120.    - SEVERE: Very SOB at rest, speaks in single words, struggling to breathe, sitting hunched forward, retractions, pulse > 120      No SOB  6. FEVER: "Do you have a fever?" If Yes, ask: "What is your temperature, how was it measured, and when did it start?"     No  7. CARDIAC HISTORY: "Do you have any history of heart disease?" (e.g., heart attack, congestive heart failure)      N/a 8. LUNG HISTORY: "Do you have any history of lung disease?"  (e.g., pulmonary embolus, asthma, emphysema)     N/a 9. PE RISK FACTORS: "Do you have a history of blood clots?" (or: recent major surgery, recent prolonged travel, bedridden)     N/a 10. OTHER SYMPTOMS: "Do you have any other symptoms?" (e.g., runny nose, wheezing, chest pain)       Runny nose, coughing, had bloody nose  11. PREGNANCY: "Is there any chance you are pregnant?" "When was your last menstrual period?"       N/a 12. TRAVEL: "Have you traveled out of the country in the last month?" (e.g., travel history, exposures)       N/a  Protocols used: Cough - Acute Productive-A-AH

## 2022-08-04 ENCOUNTER — Ambulatory Visit
Admission: EM | Admit: 2022-08-04 | Discharge: 2022-08-04 | Disposition: A | Discharge status: Home / Self Care | Payer: Managed Care, Other (non HMO) | Attending: Emergency Medicine | Admitting: Emergency Medicine

## 2022-08-04 DIAGNOSIS — N390 Urinary tract infection, site not specified: Secondary | ICD-10-CM | POA: Insufficient documentation

## 2022-08-04 LAB — POCT URINALYSIS DIP (MANUAL ENTRY)
Glucose, UA: NEGATIVE mg/dL
Ketones, POC UA: NEGATIVE mg/dL
Nitrite, UA: POSITIVE — AB
Protein Ur, POC: >=300 mg/dL — AB
Spec Grav, UA: >=1.03 — AB (ref 1.010–1.025)
Urobilinogen, UA: 1 E.U./dL
pH, UA: 6 (ref 5.0–8.0)

## 2022-08-04 MED ORDER — CEPHALEXIN 500 MG PO CAPS: 500.0000 mg | ORAL_CAPSULE | Freq: Two times a day (BID) | ORAL | 0 refills | Status: AC

## 2022-08-04 NOTE — ED Provider Notes (Signed)
[facility]    CSN: [id number] Arrival date & time: 08/04/22  1054      History   Chief Complaint Chief Complaint  Patient presents with   Urinary Frequency   Dysuria    HPI Mackenzie Newman is a 57 y.o. female.  Patient presents with dysuria and urinary urgency since last night.  No fever, abdominal pain, flank pain, hematuria, vaginal discharge, pelvic pain.  She has an ongoing cough.  Patient was seen by her PCP on 07/31/2022; diagnosed with URI; treated with Zithromax and prednisone.  She took the last dose of Zithromax this morning.    The history is provided by the patient and medical records.    Past Medical History:  Diagnosis Date   High blood pressure    High cholesterol    Obesity    Pre-diabetes     Patient Active Problem List   Diagnosis Date Noted   Elevated serum creatinine 06/07/2022   Elevated liver enzymes 06/07/2022   Pre-diabetes 03/26/2022   Polyphagia 02/20/2022   OSA on CPAP 11/20/2021   Vitamin D deficiency 10/30/2021   Insulin resistance 10/30/2021   Essential hypertension 10/30/2021   Depression 10/30/2021   Attention and concentration deficit 09/19/2020   Special screening for malignant neoplasms, colon    Melanosis of colon    External hemorrhoids    Internal hemorrhoids    Diverticulosis of large intestine without diverticulitis    Hyperlipidemia 05/02/2017   Insomnia 10/17/2016   Verruca vulgaris 10/17/2016   Acid reflux 08/28/2014   Morbid obesity with starting BMI 44 08/28/2014    Past Surgical History:  Procedure Laterality Date   CESAREAN SECTION     COLONOSCOPY WITH PROPOFOL N/A 08/02/2017   Procedure: COLONOSCOPY WITH PROPOFOL;  Surgeon: Pasty Spillers, MD;  Location: [facility];  Service: Endoscopy;  Laterality: N/A;   MOHS SURGERY      OB History     Gravida  1   Para  1   Term  1   Preterm      AB      Living  1      SAB      IAB      Ectopic      Multiple      Live Births  1             Home Medications    Prior to Admission medications   Medication Sig Start Date End Date Taking? Authorizing Provider  cephALEXin (KEFLEX) 500 MG capsule Take 1 capsule (500 mg total) by mouth 2 (two) times daily for 5 days. 08/04/22 08/09/22 Yes Mickie Bail, NP  azithromycin (ZITHROMAX) 250 MG tablet Take 500mg  PO daily x1d and then 250mg  daily x4 days Patient not taking: Reported on 08/04/2022 07/31/22   Mecum, Erin E, PA-C  buPROPion (WELLBUTRIN XL) 300 MG 24 hr tablet Take 1 tablet (300 mg total) by mouth daily. 07/10/22   Bowen, Scot Jun, DO  clotrimazole-betamethasone (LOTRISONE) cream Apply 1 application topically 2 (two) times daily. 03/27/19   Erasmo Downer, MD  fluorouracil (EFUDEX) 5 % cream Apply 1 Application topically 2 (two) times daily. 08/03/21   [provider]  guaiFENesin-codeine (ROBITUSSIN AC) 100-10 MG/5ML syrup Take 5 mLs by mouth 3 (three) times daily as needed for cough. 07/31/22   Mecum, Erin E, PA-C  losartan-hydrochlorothiazide (HYZAAR) 100-25 MG tablet Take 1 tablet by mouth daily. 05/03/22   Erasmo Downer, MD  naproxen (NAPROSYN) 500 MG  tablet TAKE 1 TABLET(500 MG) BY MOUTH TWICE DAILY AS NEEDED Patient not taking: Reported on 08/04/2022 09/27/21   Erasmo Downer, MD  omeprazole (PRILOSEC) 20 MG capsule TAKE 1 CAPSULE(20 MG) BY MOUTH DAILY 05/31/22   Beryle Flock, Marzella Schlein, MD  predniSONE (DELTASONE) 20 MG tablet Take 60mg  PO daily x 2 days, then40mg  PO daily x 2 days, then 20mg  PO daily x 3 days Patient not taking: Reported on 08/04/2022 07/31/22   Mecum, Oswaldo Conroy, PA-C  Semaglutide-Weight Management (WEGOVY) 1 MG/0.5ML SOAJ Inject 1 mg into the skin once a week. 07/10/22   Bowen, Scot Jun, DO  Vitamin D, Ergocalciferol, (DRISDOL) 1.25 MG (50000 UNIT) CAPS capsule Take 1 capsule (50,000 Units total) by mouth every 7 (seven) days. 07/10/22   Bowen, Scot Jun, DO  zolpidem (AMBIEN) 10 MG tablet Take 1 tablet (10 mg total) by mouth at bedtime as needed.  for sleep 06/29/22   Erasmo Downer, MD    Family History Family History  Adopted: Yes  Problem Relation Age of Onset   Breast cancer Neg Hx     Social History Social History   Tobacco Use   Smoking status: Never   Smokeless tobacco: Never  Vaping Use   Vaping Use: Never used  Substance Use Topics   Alcohol use: No   Drug use: No     Allergies   Patient has no known allergies.   Review of Systems Review of Systems  Constitutional:  Negative for chills and fever.  Gastrointestinal:  Negative for abdominal pain, nausea and vomiting.  Genitourinary:  Positive for dysuria and frequency. Negative for flank pain, hematuria, pelvic pain and vaginal discharge.  All other systems reviewed and are negative.    Physical Exam Triage Vital Signs ED Triage Vitals  Enc Vitals Group     BP 08/04/22 1121 123/66     Pulse Rate 08/04/22 1121 73     Resp 08/04/22 1121 18     Temp 08/04/22 1121 98 F (36.7 C)     Temp src --      SpO2 08/04/22 1121 98 %     Weight --      Height --      Head Circumference --      Peak Flow --      Pain Score 08/04/22 1115 3     Pain Loc --      Pain Edu? --      Excl. in GC? --    No data found.  Updated Vital Signs BP 123/66   Pulse 73   Temp 98 F (36.7 C)   Resp 18   SpO2 98%   Visual Acuity Right Eye Distance:   Left Eye Distance:   Bilateral Distance:    Right Eye Near:   Left Eye Near:    Bilateral Near:     Physical Exam Vitals and nursing note reviewed.  Constitutional:      General: She is not in acute distress.    Appearance: She is well-developed. She is not ill-appearing.  HENT:     Mouth/Throat:     Mouth: Mucous membranes are moist.  Cardiovascular:     Rate and Rhythm: Normal rate and regular rhythm.     Heart sounds: Normal heart sounds.  Pulmonary:     Effort: Pulmonary effort is normal. No respiratory distress.     Breath sounds: Normal breath sounds.  Abdominal:     General: Bowel sounds  are normal.  Palpations: Abdomen is soft.     Tenderness: There is no abdominal tenderness. There is no right CVA tenderness, left CVA tenderness, guarding or rebound.  Musculoskeletal:     Cervical back: Neck supple.  Skin:    General: Skin is warm and dry.  Neurological:     Mental Status: She is alert.  Psychiatric:        Mood and Affect: Mood normal.        Behavior: Behavior normal.      UC Treatments / Results  Labs (all labs ordered are listed, but only abnormal results are displayed) Labs Reviewed  POCT URINALYSIS DIP (MANUAL ENTRY) - Abnormal; Notable for the following components:      Result Value   Clarity, UA cloudy (*)    Bilirubin, UA small (*)    Spec Grav, UA >=1.030 (*)    Blood, UA large (*)    Protein Ur, POC >=300 (*)    Nitrite, UA Positive (*)    Leukocytes, UA Small (1+) (*)    All other components within normal limits  URINE CULTURE    EKG   Radiology No results found.  Procedures Procedures (including critical care time)  Medications Ordered in UC Medications - No data to display  Initial Impression / Assessment and Plan / UC Course  I have reviewed the triage vital signs and the nursing notes.  Pertinent labs & imaging results that were available during my care of the patient were reviewed by me and considered in my medical decision making (see chart for details).    UTI.  Treating with Keflex. Urine culture pending. Discussed with patient that we will call her if the urine culture shows the need to change or discontinue the antibiotic. Instructed her to follow-up with her PCP if her symptoms are not improving. Patient agrees to plan of care.     Final Clinical Impressions(s) / UC Diagnoses   Final diagnoses:  Urinary tract infection without hematuria, site unspecified     Discharge Instructions      Take the antibiotic as directed.  The urine culture is pending.  We will call you if it shows the need to change or  discontinue your antibiotic.    Follow up with your primary care provider if your symptoms are not improving.        ED Prescriptions     Medication Sig Dispense Auth. Provider   cephALEXin (KEFLEX) 500 MG capsule Take 1 capsule (500 mg total) by mouth 2 (two) times daily for 5 days. 10 capsule Mickie Bail, NP      PDMP not reviewed this encounter.   Mickie Bail, NP 08/04/22 1149

## 2022-08-04 NOTE — Discharge Instructions (Addendum)
Take the antibiotic as directed.  The urine culture is pending.  We will call you if it shows the need to change or discontinue your antibiotic.    Follow up with your primary care provider if your symptoms are not improving.    

## 2022-08-04 NOTE — ED Triage Notes (Signed)
Patient to Urgent Care with complaints of urinary urgency that started last night. Reports frequently having to urinate with little urine. Dysuria. Denies any vaginal discharge.   Recent URI treated with z-pack that she started on Tuesday. Reports still coughing. Back/ side pain due to cough.

## 2022-08-06 LAB — URINE CULTURE: Culture: >=100000 — AB

## 2022-08-16 ENCOUNTER — Other Ambulatory Visit: Payer: Self-pay

## 2022-08-16 ENCOUNTER — Encounter (INDEPENDENT_AMBULATORY_CARE_PROVIDER_SITE_OTHER): Payer: Self-pay | Admitting: Family Medicine

## 2022-08-16 ENCOUNTER — Ambulatory Visit (INDEPENDENT_AMBULATORY_CARE_PROVIDER_SITE_OTHER): Payer: Managed Care, Other (non HMO) | Admitting: Family Medicine

## 2022-08-16 VITALS — BP 90/60 | HR 72 | Temp 98.7°F | Ht 63.0 in | Wt 222.0 lb

## 2022-08-16 DIAGNOSIS — R7303 Prediabetes: Secondary | ICD-10-CM | POA: Diagnosis not present

## 2022-08-16 DIAGNOSIS — E559 Vitamin D deficiency, unspecified: Secondary | ICD-10-CM

## 2022-08-16 DIAGNOSIS — R7989 Other specified abnormal findings of blood chemistry: Secondary | ICD-10-CM

## 2022-08-16 DIAGNOSIS — F329 Major depressive disorder, single episode, unspecified: Secondary | ICD-10-CM | POA: Diagnosis not present

## 2022-08-16 DIAGNOSIS — Z6839 Body mass index (BMI) 39.0-39.9, adult: Secondary | ICD-10-CM

## 2022-08-16 MED ORDER — WEGOVY 1 MG/0.5ML ~~LOC~~ SOAJ
1.0000 mg | SUBCUTANEOUS | 0 refills | Status: DC
  Filled 2022-08-16: qty 2, 28d supply, fill #0

## 2022-08-16 NOTE — Progress Notes (Signed)
Office: [phone]  /  Fax: [phone]  WEIGHT SUMMARY AND BIOMETRICS  Starting Date: 10/16/21  Starting Weight: 249 lb   Weight Lost Since Last Visit: 8 lb   Vitals Temp: 98.7 F (37.1 C) BP: 90/60 Pulse Rate: 72 SpO2: 93 %   Body Composition  Body Fat %: 46.7 % Fat Mass (lbs): 103.6 lbs Muscle Mass (lbs): 112.4 lbs Total Body Water (lbs): 78.2 lbs Visceral Fat Rating : 14     HPI  Chief Complaint: OBESITY  Mackenzie Newman is here to discuss her progress with her obesity treatment plan. She is on the the Category 3 Plan and states she is following her eating plan approximately 30 % of the time. She states she is not exercising.  Interval History:  Since last office visit she is down 8 lb She lost 4 pounds of muscle mass and lost 4.4 pounds of body fat in the past month She was sick for 3 weeks with a respiratory infection and wasn't very hungry Her husband was also sick She is able to hydrate well She is back on her meal plan and plans to walk more in the mornings She is down 4 lb of muscle mass and down 4.4 lb of body fat She is doing well on Wegovy 1 mg weekly with good appetite control and without GI side effects She has cut back on soda intake She is getting in more fruits and veggies  Pharmacotherapy: Wegovy 1 mg weekly injection  PHYSICAL EXAM:  Blood pressure 90/60, pulse 72, temperature 98.7 F (37.1 C), height 5\' 3"  (1.6 m), weight 222 lb (100.7 kg), SpO2 93 %. Body mass index is 39.33 kg/m.  General: She is overweight, cooperative, alert, well developed, and in no acute distress. PSYCH: Has normal mood, affect and thought process.   Lungs: Normal breathing effort, no conversational dyspnea.   ASSESSMENT AND PLAN  TREATMENT PLAN FOR OBESITY:  Recommended Dietary Goals  [name] is currently in the action stage of change. As such, her goal is to continue weight management plan. She has agreed to the Category 3 Plan.  Behavioral  Intervention  We discussed the following Behavioral Modification Strategies today: increasing lean protein intake, decreasing simple carbohydrates , increasing vegetables, increasing lower glycemic fruits, avoiding skipping meals, increasing water intake, keeping healthy foods at home, continue to practice mindfulness when eating, and planning for success.  Additional resources provided today: NA  Recommended Physical Activity Goals  Mackenzie Newman has been advised to work up to 150 minutes of moderate intensity aerobic activity a week and strengthening exercises 2-3 times per week for cardiovascular health, weight loss maintenance and preservation of muscle mass.   She has agreed to Increase the intensity, frequency or duration of aerobic exercises    Pharmacotherapy changes for the treatment of obesity:   ASSOCIATED CONDITIONS ADDRESSED TODAY  Pre-diabetes Assessment & Plan: Lab Results  Component Value Date   HGBA1C 5.7 (H) 04/30/2022   Patient has lost 27 pounds in the past 10 months of medically supervised weight management.  This is a 10.8% total body weight loss.  She has reduced her intake of added sugar and refined carbohydrates while increasing her walking time.  Anticipate improvements in A1c levels.  Recheck A1c today.  Continue prescribed dietary plan along with 30 minutes of walking 5 times a week.  Orders: -     Hemoglobin A1c -     Insulin, random  Major depressive disorder, remission status unspecified, unspecified whether recurrent Assessment & Plan:  Patient reports stable mood and a good support system at home.  Emotional eating has improved on Wellbutrin 300 mg XL once daily without adverse side effect.  Continue Wellbutrin XL 300 mg once daily.  Continue working on stress reduction, mindful eating.   Morbid obesity with starting BMI 44 -     Wegovy; Inject 1 mg into the skin once a week.  Dispense: 2 mL; Refill: 0  Vitamin D deficiency Assessment & Plan: Last  vitamin D Lab Results  Component Value Date   VD25OH 42.6 04/30/2022   She has been taking vitamin D 50,000 IU once weekly over the past 3 months.  Her energy level is improving.  Repeat vitamin D level today.  Orders: -     VITAMIN D 25 Hydroxy (Vit-D Deficiency, Fractures)  Elevated serum creatinine Assessment & Plan: Her last creatinine was 1.39 with a GFR 46 while taking Naprosyn.  She has been hydrating well and has reduced her frequency of NSAID use.  Repeat chemistry panel today.  Orders: -     Comprehensive metabolic panel  BMI 39.0-39.9,adult      She was informed of the importance of frequent follow up visits to maximize her success with intensive lifestyle modifications for her multiple health conditions.   ATTESTASTION STATEMENTS:  Reviewed by clinician on day of visit: allergies, medications, problem list, medical history, surgical history, family history, social history, and previous encounter notes pertinent to obesity diagnosis.   I have personally spent 30 minutes total time today in preparation, patient care, nutritional counseling and documentation for this visit, including the following: review of clinical lab tests; review of medical tests/procedures/services.      Mackenzie Brink, DO DABFM, DABOM [facility] [address] [place], [place] [address] [phone]

## 2022-08-16 NOTE — Assessment & Plan Note (Signed)
Last vitamin D Lab Results  Component Value Date   VD25OH 42.6 04/30/2022   She has been taking vitamin D 50,000 IU once weekly over the past 3 months.  Her energy level is improving.  Repeat vitamin D level today.

## 2022-08-16 NOTE — Assessment & Plan Note (Signed)
Patient reports stable mood and a good support system at home.  Emotional eating has improved on Wellbutrin 300 mg XL once daily without adverse side effect.  Continue Wellbutrin XL 300 mg once daily.  Continue working on stress reduction, mindful eating.

## 2022-08-16 NOTE — Assessment & Plan Note (Signed)
Lab Results  Component Value Date   HGBA1C 5.7 (H) 04/30/2022   Patient has lost 27 pounds in the past 10 months of medically supervised weight management.  This is a 10.8% total body weight loss.  She has reduced her intake of added sugar and refined carbohydrates while increasing her walking time.  Anticipate improvements in A1c levels.  Recheck A1c today.  Continue prescribed dietary plan along with 30 minutes of walking 5 times a week.

## 2022-08-16 NOTE — Assessment & Plan Note (Signed)
Her last creatinine was 1.39 with a GFR 46 while taking Naprosyn.  She has been hydrating well and has reduced her frequency of NSAID use.  Repeat chemistry panel today.

## 2022-08-17 LAB — COMPREHENSIVE METABOLIC PANEL
ALT: 13 IU/L (ref 0–32)
AST: 14 IU/L (ref 0–40)
Albumin/Globulin Ratio: 1.3
Albumin: 4 g/dL (ref 3.8–4.9)
Alkaline Phosphatase: 101 IU/L (ref 44–121)
BUN/Creatinine Ratio: 16 (ref 9–23)
BUN: 21 mg/dL (ref 6–24)
Bilirubin Total: 0.5 mg/dL (ref 0.0–1.2)
CO2: 23 mmol/L (ref 20–29)
Calcium: 9.8 mg/dL (ref 8.7–10.2)
Chloride: 101 mmol/L (ref 96–106)
Creatinine, Ser: 1.29 mg/dL — ABNORMAL HIGH (ref 0.57–1.00)
Globulin, Total: 3.1 g/dL (ref 1.5–4.5)
Glucose: 87 mg/dL (ref 70–99)
Potassium: 3.6 mmol/L (ref 3.5–5.2)
Sodium: 140 mmol/L (ref 134–144)
Total Protein: 7.1 g/dL (ref 6.0–8.5)
eGFR: 49 mL/min/{1.73_m2} — ABNORMAL LOW (ref >59)

## 2022-08-17 LAB — HEMOGLOBIN A1C
Est. average glucose Bld gHb Est-mCnc: 114 mg/dL
Hgb A1c MFr Bld: 5.6 % (ref 4.8–5.6)

## 2022-08-17 LAB — INSULIN, RANDOM: INSULIN: 23.1 u[IU]/mL (ref 2.6–24.9)

## 2022-08-17 LAB — VITAMIN D 25 HYDROXY (VIT D DEFICIENCY, FRACTURES): Vit D, 25-Hydroxy: 46.6 ng/mL (ref 30.0–100.0)

## 2022-08-20 ENCOUNTER — Other Ambulatory Visit: Payer: Self-pay

## 2022-09-11 ENCOUNTER — Ambulatory Visit (INDEPENDENT_AMBULATORY_CARE_PROVIDER_SITE_OTHER): Payer: Managed Care, Other (non HMO) | Admitting: Family Medicine

## 2022-09-11 ENCOUNTER — Other Ambulatory Visit: Payer: Self-pay

## 2022-09-11 VITALS — BP 118/79 | HR 64 | Temp 98.3°F | Ht 63.0 in | Wt 222.0 lb

## 2022-09-11 DIAGNOSIS — E559 Vitamin D deficiency, unspecified: Secondary | ICD-10-CM

## 2022-09-11 DIAGNOSIS — R7303 Prediabetes: Secondary | ICD-10-CM

## 2022-09-11 DIAGNOSIS — F329 Major depressive disorder, single episode, unspecified: Secondary | ICD-10-CM

## 2022-09-11 DIAGNOSIS — N1831 Chronic kidney disease, stage 3a: Secondary | ICD-10-CM | POA: Diagnosis not present

## 2022-09-11 DIAGNOSIS — E88819 Insulin resistance, unspecified: Secondary | ICD-10-CM

## 2022-09-11 DIAGNOSIS — Z6839 Body mass index (BMI) 39.0-39.9, adult: Secondary | ICD-10-CM

## 2022-09-11 DIAGNOSIS — N182 Chronic kidney disease, stage 2 (mild): Secondary | ICD-10-CM | POA: Insufficient documentation

## 2022-09-11 MED ORDER — WEGOVY 1 MG/0.5ML ~~LOC~~ SOAJ
1.0000 mg | SUBCUTANEOUS | 0 refills | Status: DC
  Filled 2022-09-11: qty 2, 28d supply, fill #0

## 2022-09-11 MED ORDER — METFORMIN HCL 500 MG PO TABS: 500.0000 mg | ORAL_TABLET | Freq: Every day | ORAL | 0 refills | Status: DC

## 2022-09-11 MED ORDER — VITAMIN D (ERGOCALCIFEROL) 1.25 MG (50000 UNIT) PO CAPS: 50000.0000 [IU] | ORAL_CAPSULE | ORAL | 0 refills | Status: DC

## 2022-09-11 NOTE — Assessment & Plan Note (Signed)
Reviewed labs from last visit.  Fasting insulin remains elevated at 23.1, slightly improved from previous.  She has declined use of metformin.  She has reduced her intake of starches and sweets.  She is still not doing any formal exercise but has been more physically active.  We discussed reading labels on food and drink for sugar, avoiding products with over 8 g of added sugar per serving. Plan on increasing walking time to 30 minutes 5 days a week to help with insulin sensitivity.  Recommend tracking of daily steps.  Financial reasons are barriers to obtaining a smart watch.

## 2022-09-11 NOTE — Assessment & Plan Note (Signed)
Reviewed most recent chemistry panel with a GFR of 49.  Her creatinine has been stable and mildly elevated for over 1 year now.  She has reduced frequency of Naprosyn to less than 1 time a month.  Will CC her PCP to follow.  She may need a renal ultrasound to look for the cause for her renal dysfunction.

## 2022-09-11 NOTE — Assessment & Plan Note (Signed)
Mood stable on Wellbutrin XL 300 mg once daily.  She has noticed more irritability when forgetting to take Wellbutrin.  Emotional eating is under better control.  Her husband is supportive.  Continue to work on stress reduction, proper sleep at night and keeping junk food triggers out of the house.  Continue Wellbutrin XL 300 mg once daily.

## 2022-09-11 NOTE — Progress Notes (Signed)
Office: [phone]  /  Fax: [phone]  WEIGHT SUMMARY AND BIOMETRICS  Starting Date: 10/16/21  Starting Weight: 249lb   Weight Lost Since Last Visit: 0lb   Vitals Temp: 98.3 F (36.8 C) BP: 118/79 Pulse Rate: 64 SpO2: 98 %   Body Composition  Body Fat %: 47.2 % Fat Mass (lbs): 105.2 lbs Muscle Mass (lbs): 111.6 lbs Total Body Water (lbs): 79.4 lbs Visceral Fat Rating : 14     HPI  Chief Complaint: OBESITY  Mackenzie Newman is here to discuss her progress with her obesity treatment plan. She is on the the Category 3 Plan and states she is following her eating plan approximately 50 % of the time. She states she is walking 30 minutes 3 times per week.   Interval History:  Since last office visit she is down 0 lb She has cut back on soda intake and is getting in 2 meals per day on Wegovy 1 mg weekly Feels adequate satiety from [facility] with reduced portion sizes.  Denies GI side effects She is down 0.8 lb of muscle mass in the past 4 weeks and up 1.6 pounds of body fat No recent travel She has a net weight loss of 27 lb in the past 10 months of medically supervised weight management Has some sugar cravings Has good energy and she is helping husband build a shop for physical activity Job stress is stable  Pharmacotherapy: Wegovy 1 mg once weekly injection  PHYSICAL EXAM:  Blood pressure 118/79, pulse 64, temperature 98.3 F (36.8 C), height 5\' 3"  (1.6 m), weight 222 lb (100.7 kg), SpO2 98 %. Body mass index is 39.33 kg/m.  General: She is overweight, cooperative, alert, well developed, and in no acute distress. PSYCH: Has normal mood, affect and thought process.   Lungs: Normal breathing effort, no conversational dyspnea.   ASSESSMENT AND PLAN  TREATMENT PLAN FOR OBESITY:  Recommended Dietary Goals  Mackenzie Newman is currently in the action stage of change. As such, her goal is to continue weight management plan. She has agreed to the Category 3 Plan.  Behavioral  Intervention  We discussed the following Behavioral Modification Strategies today: increasing lean protein intake, decreasing simple carbohydrates , increasing vegetables, increasing lower glycemic fruits, increasing water intake, work on meal planning and preparation, keeping healthy foods at home, work on managing stress, creating time for self-care and relaxation measures, continue to practice mindfulness when eating, and planning for success.  Additional resources provided today: NA  Recommended Physical Activity Goals  Mackenzie Newman has been advised to work up to 150 minutes of moderate intensity aerobic activity a week and strengthening exercises 2-3 times per week for cardiovascular health, weight loss maintenance and preservation of muscle mass.   She has agreed to Think about ways to increase daily physical activity and overcoming barriers to exercise  Pharmacotherapy changes for the treatment of obesity: None  ASSOCIATED CONDITIONS ADDRESSED TODAY  Insulin resistance Assessment & Plan: Reviewed labs from last visit.  Fasting insulin remains elevated at 23.1, slightly improved from previous.  She has declined use of metformin.  She has reduced her intake of starches and sweets.  She is still not doing any formal exercise but has been more physically active.  We discussed reading labels on food and drink for sugar, avoiding products with over 8 g of added sugar per serving. Plan on increasing walking time to 30 minutes 5 days a week to help with insulin sensitivity.  Recommend tracking of daily steps.  Financial  reasons are barriers to obtaining a smart watch.  Orders: -     metFORMIN HCl; Take 1 tablet (500 mg total) by mouth daily with breakfast.  Dispense: 30 tablet; Refill: 0  Morbid obesity with starting BMI 44 -     Wegovy; Inject 1 mg into the skin once a week.  Dispense: 2 mL; Refill: 0  Vitamin D deficiency Assessment & Plan: Reviewed labs from last visit.  Vitamin D level  has improved to 46.6 on vitamin D 50,000 IU once weekly.  Energy level is improving.  Denies adverse side effects.  We discussed a goal vitamin D level 50-70.  Continue prescription vitamin D 50,000 IU once weekly.  Recheck level in 4 months.  Orders: -     Vitamin D (Ergocalciferol); Take 1 capsule (50,000 Units total) by mouth every 7 (seven) days.  Dispense: 12 capsule; Refill: 0  BMI 39.0-39.9,adult  Major depressive disorder, remission status unspecified, unspecified whether recurrent Assessment & Plan: Mood stable on Wellbutrin XL 300 mg once daily.  She has noticed more irritability when forgetting to take Wellbutrin.  Emotional eating is under better control.  Her husband is supportive.  Continue to work on stress reduction, proper sleep at night and keeping junk food triggers out of the house.  Continue Wellbutrin XL 300 mg once daily.   Stage 3a chronic kidney disease (HCC) Assessment & Plan: Reviewed most recent chemistry panel with a GFR of 49.  Her creatinine has been stable and mildly elevated for over 1 year now.  She has reduced frequency of Naprosyn to less than 1 time a month.  Will CC her PCP to follow.  She may need a renal ultrasound to look for the cause for her renal dysfunction.   Pre-diabetes Assessment & Plan: Improving.  A1c reduced from 5.7-5.6.  Reviewed labs from last visit.  She has done well with 27 pounds of weight loss in the past 10 months of medically supervised weight management.  Continue working on prescribed dietary plan along with regular physical activity.       She was informed of the importance of frequent follow up visits to maximize her success with intensive lifestyle modifications for her multiple health conditions.   ATTESTASTION STATEMENTS:  Reviewed by clinician on day of visit: allergies, medications, problem list, medical history, surgical history, family history, social history, and previous encounter notes pertinent to  obesity diagnosis.   I have personally spent 30 minutes total time today in preparation, patient care, nutritional counseling and documentation for this visit, including the following: review of clinical lab tests; review of medical tests/procedures/services.      Glennis Brink, DO DABFM, DABOM [facility] [address] [place], [place] [address] [phone]

## 2022-09-11 NOTE — Assessment & Plan Note (Signed)
Improving.  A1c reduced from 5.7-5.6.  Reviewed labs from last visit.  She has done well with 27 pounds of weight loss in the past 10 months of medically supervised weight management.  Continue working on prescribed dietary plan along with regular physical activity.

## 2022-09-11 NOTE — Assessment & Plan Note (Signed)
Reviewed labs from last visit.  Vitamin D level has improved to 46.6 on vitamin D 50,000 IU once weekly.  Energy level is improving.  Denies adverse side effects.  We discussed a goal vitamin D level 50-70.  Continue prescription vitamin D 50,000 IU once weekly.  Recheck level in 4 months.

## 2022-10-08 ENCOUNTER — Encounter (INDEPENDENT_AMBULATORY_CARE_PROVIDER_SITE_OTHER): Payer: Self-pay | Admitting: Family Medicine

## 2022-10-08 ENCOUNTER — Other Ambulatory Visit: Payer: Self-pay

## 2022-10-08 ENCOUNTER — Ambulatory Visit (INDEPENDENT_AMBULATORY_CARE_PROVIDER_SITE_OTHER): Payer: Managed Care, Other (non HMO) | Admitting: Family Medicine

## 2022-10-08 VITALS — BP 108/73 | HR 78 | Temp 98.2°F | Ht 63.0 in | Wt 216.0 lb

## 2022-10-08 DIAGNOSIS — E88819 Insulin resistance, unspecified: Secondary | ICD-10-CM | POA: Diagnosis not present

## 2022-10-08 DIAGNOSIS — R7303 Prediabetes: Secondary | ICD-10-CM

## 2022-10-08 DIAGNOSIS — N1831 Chronic kidney disease, stage 3a: Secondary | ICD-10-CM

## 2022-10-08 DIAGNOSIS — Z6838 Body mass index (BMI) 38.0-38.9, adult: Secondary | ICD-10-CM | POA: Diagnosis not present

## 2022-10-08 MED ORDER — METFORMIN HCL 500 MG PO TABS: 500.0000 mg | ORAL_TABLET | Freq: Every day | ORAL | 0 refills | Status: DC

## 2022-10-08 MED ORDER — WEGOVY 1 MG/0.5ML ~~LOC~~ SOAJ
1.0000 mg | SUBCUTANEOUS | 1 refills | Status: DC
  Filled 2022-10-08: qty 2, 28d supply, fill #0

## 2022-10-08 NOTE — Assessment & Plan Note (Signed)
Last fasting insulin elevated at 23.1, marginally improved from previous reading.  Metformin 500 mg once daily was added at last visit and sugar cravings have reduced.  She denies any loose stools.  She had 1 episode of nausea, potentially from not eating well on Wegovy as she was fasting for blood work.  She has been focused on lean protein and fiber intake with meals.  She has room for improvement with increasing walking time and regular exercise.  Continue metformin 500 mg once daily with food.  Recheck fasting insulin, B12 and chemistry panel next visit. Continue prescribed dietary plan.  Work on increasing walking time.

## 2022-10-08 NOTE — Progress Notes (Signed)
Office: [phone]  /  Fax: [phone]  WEIGHT SUMMARY AND BIOMETRICS  Starting Date: 10/16/21  Starting Weight: 249lb   Weight Lost Since Last Visit: 6lb   Vitals Temp: 98.2 F (36.8 C) BP: 108/73 Pulse Rate: 78 SpO2: 97 %   Body Composition  Body Fat %: 45.9 % Fat Mass (lbs): 99.4 lbs Muscle Mass (lbs): 111.2 lbs Total Body Water (lbs): 77 lbs Visceral Fat Rating : 14     HPI  Chief Complaint: OBESITY  Mackenzie Newman is here to discuss her progress with her obesity treatment plan. She is on the the Category 3 Plan and states she is following her eating plan approximately 50 % of the time. She states she is exercising 0 minutes 0 times per week.   Interval History:  Since last office visit she is down 6 lb Muscle mass is down 0.4 pounds and body fat is down 5.8 pounds in the past month This gives her a net weight loss of 33 pounds in the past 11 months of medically supervised weight management  this is a 13.2% total body weight loss Metformin 500 mg once daily was added last visit for insulin resistance She had one episode of N/V after fasting for bloodwork at work She still has good food volume control on Wegovy 1 mg weekly She has been eating 4-5 smaller meals a day each with lean protein and fiber She has noticed a reduction of sugar cravings on metformin Denies any vomiting or constipation She has been physically active working in the yard but not doing any formal exercise  Pharmacotherapy: Wegovy 1 mg weekly, metformin 500 mg once daily  PHYSICAL EXAM:  Blood pressure 108/73, pulse 78, temperature 98.2 F (36.8 C), height 5\' 3"  (1.6 m), weight 216 lb (98 kg), SpO2 97%. Body mass index is 38.26 kg/m.  General: She is overweight, cooperative, alert, well developed, and in no acute distress. PSYCH: Has normal mood, affect and thought process.   Lungs: Normal breathing effort, no conversational dyspnea.   ASSESSMENT AND PLAN  TREATMENT PLAN FOR  OBESITY:  Recommended Dietary Goals  Zakyria is currently in the action stage of change. As such, her goal is to continue weight management plan. She has agreed to the Category 3 Plan.  Behavioral Intervention  We discussed the following Behavioral Modification Strategies today: increasing lean protein intake, decreasing simple carbohydrates , increasing vegetables, increasing lower glycemic fruits, increasing water intake, work on meal planning and preparation, keeping healthy foods at home, work on managing stress, creating time for self-care and relaxation measures, continue to practice mindfulness when eating, and planning for success.  Additional resources provided today: NA  Recommended Physical Activity Goals  [name] has been advised to work up to 150 minutes of moderate intensity aerobic activity a week and strengthening exercises 2-3 times per week for cardiovascular health, weight loss maintenance and preservation of muscle mass.   She has agreed to [employer] strengthening exercises with a goal of 2-3 sessions a week   Pharmacotherapy changes for the treatment of obesity: None  ASSOCIATED CONDITIONS ADDRESSED TODAY  Insulin resistance Assessment & Plan: Last fasting insulin elevated at 23.1, marginally improved from previous reading.  Metformin 500 mg once daily was added at last visit and sugar cravings have reduced.  She denies any loose stools.  She had 1 episode of nausea, potentially from not eating well on Wegovy as she was fasting for blood work.  She has been focused on lean protein and fiber intake  with meals.  She has room for improvement with increasing walking time and regular exercise.  Continue metformin 500 mg once daily with food.  Recheck fasting insulin, B12 and chemistry panel next visit. Continue prescribed dietary plan.  Work on increasing walking time.  Orders: -     metFORMIN HCl; Take 1 tablet (500 mg total) by mouth daily with breakfast.  Dispense: 90  tablet; Refill: 0  Morbid obesity with starting BMI 44 -     Wegovy; Inject 1 mg into the skin once a week.  Dispense: 2 mL; Refill: 1  BMI 38.0-38.9,adult  Stage 3a chronic kidney disease (HCC) Assessment & Plan: Kidney function has remained stable.  She is really taking an NSAID as needed for pain or headaches.  She is scheduled for an upcoming visit with her PCP in late October.  Kidney function has been in the CKD stage IIIa range for approximately 3 years now.  Recommend renal ultrasound.  Avoid nephrotoxic agents.   Pre-diabetes Assessment & Plan: Lab Results  Component Value Date   HGBA1C 5.6 08/16/2022   A1c has improved with dietary change, weight loss, use of Wegovy and now metformin.  Continue working on a reduced calorie low sugar diet focus on lean protein and fiber with meals along with increasing levels of physical activity.       She was informed of the importance of frequent follow up visits to maximize her success with intensive lifestyle modifications for her multiple health conditions.   ATTESTASTION STATEMENTS:  Reviewed by clinician on day of visit: allergies, medications, problem list, medical history, surgical history, family history, social history, and previous encounter notes pertinent to obesity diagnosis.   I have personally spent 30 minutes total time today in preparation, patient care, nutritional counseling and documentation for this visit, including the following: review of clinical lab tests; review of medical tests/procedures/services.      Glennis Brink, DO DABFM, DABOM [facility] [address] [place], [place] [address] [phone]

## 2022-10-08 NOTE — Assessment & Plan Note (Signed)
Lab Results  Component Value Date   HGBA1C 5.6 08/16/2022   A1c has improved with dietary change, weight loss, use of Wegovy and now metformin.  Continue working on a reduced calorie low sugar diet focus on lean protein and fiber with meals along with increasing levels of physical activity.

## 2022-10-08 NOTE — Assessment & Plan Note (Signed)
Kidney function has remained stable.  She is really taking an NSAID as needed for pain or headaches.  She is scheduled for an upcoming visit with her PCP in late October.  Kidney function has been in the CKD stage IIIa range for approximately 3 years now.  Recommend renal ultrasound.  Avoid nephrotoxic agents.

## 2022-10-22 ENCOUNTER — Telehealth (INDEPENDENT_AMBULATORY_CARE_PROVIDER_SITE_OTHER): Payer: Self-pay | Admitting: Family Medicine

## 2022-10-22 NOTE — Telephone Encounter (Signed)
The patient mentioned that an edit is needed on the after-visit summary regarding depression and kidney disease. She would like for them to be removed. Please contact the patient if you have any questions and to confirm when completed.

## 2022-10-23 ENCOUNTER — Other Ambulatory Visit: Payer: Self-pay | Admitting: Family Medicine

## 2022-10-29 ENCOUNTER — Encounter (INDEPENDENT_AMBULATORY_CARE_PROVIDER_SITE_OTHER): Payer: Self-pay

## 2022-11-06 ENCOUNTER — Encounter (INDEPENDENT_AMBULATORY_CARE_PROVIDER_SITE_OTHER): Payer: Self-pay | Admitting: Physician Assistant

## 2022-11-06 ENCOUNTER — Ambulatory Visit (INDEPENDENT_AMBULATORY_CARE_PROVIDER_SITE_OTHER): Payer: Managed Care, Other (non HMO) | Admitting: Physician Assistant

## 2022-11-06 ENCOUNTER — Other Ambulatory Visit: Payer: Self-pay

## 2022-11-06 VITALS — BP 93/61 | HR 72 | Temp 98.1°F | Ht 65.0 in | Wt 211.0 lb

## 2022-11-06 DIAGNOSIS — F3289 Other specified depressive episodes: Secondary | ICD-10-CM | POA: Diagnosis not present

## 2022-11-06 DIAGNOSIS — E559 Vitamin D deficiency, unspecified: Secondary | ICD-10-CM

## 2022-11-06 DIAGNOSIS — R7989 Other specified abnormal findings of blood chemistry: Secondary | ICD-10-CM | POA: Insufficient documentation

## 2022-11-06 DIAGNOSIS — E88819 Insulin resistance, unspecified: Secondary | ICD-10-CM | POA: Diagnosis not present

## 2022-11-06 DIAGNOSIS — R5383 Other fatigue: Secondary | ICD-10-CM

## 2022-11-06 DIAGNOSIS — F32A Depression, unspecified: Secondary | ICD-10-CM | POA: Insufficient documentation

## 2022-11-06 DIAGNOSIS — E669 Obesity, unspecified: Secondary | ICD-10-CM

## 2022-11-06 DIAGNOSIS — Z6835 Body mass index (BMI) 35.0-35.9, adult: Secondary | ICD-10-CM

## 2022-11-06 MED ORDER — BUPROPION HCL ER (XL) 300 MG PO TB24: 300.0000 mg | ORAL_TABLET | Freq: Every day | ORAL | 0 refills | Status: DC

## 2022-11-06 MED ORDER — WEGOVY 1 MG/0.5ML ~~LOC~~ SOAJ: 1.0000 mg | SUBCUTANEOUS | 1 refills | Status: DC

## 2022-11-06 MED ORDER — WEGOVY 1 MG/0.5ML ~~LOC~~ SOAJ
1.0000 mg | SUBCUTANEOUS | 1 refills | Status: AC
Start: 2022-11-06 — End: ?
  Filled 2022-11-06 – 2022-11-23 (×2): qty 2, 28d supply, fill #0

## 2022-11-06 NOTE — Progress Notes (Signed)
.smr  Office: [phone]  /  Fax: [phone]  WEIGHT SUMMARY AND BIOMETRICS  Vitals Temp: 98.1 F (36.7 C) BP: 93/61 Pulse Rate: 72 SpO2: 96 %   Anthropometric Measurements Height: 5\' 5"  (1.651 m) (height rechecked today) Weight: 211 lb (95.7 kg) BMI (Calculated): 35.11 Weight at Last Visit: 216 lb Weight Lost Since Last Visit: 5 lb Weight Gained Since Last Visit: 0 Starting Weight: 249 lb Total Weight Loss (lbs): 38 lb (17.2 kg) Peak Weight: 249 lb   Body Composition  Body Fat %: 43.2 % Fat Mass (lbs): 91.2 lbs Muscle Mass (lbs): 113.8 lbs Total Body Water (lbs): 77.8 lbs Visceral Fat Rating : 12   Other Clinical Data Fasting: yes Labs: yes Today's Visit #: 14 Starting Date: 10/16/21     HPI  Chief Complaint: OBESITY  Quinette is here to discuss her progress with her obesity treatment plan. She is on the the Category 3 Plan and states she is following her eating plan approximately 60 % of the time. She states she is exercising walking 30 minutes 5 times per week. Discussed the use of AI scribe software for clinical note transcription with the patient, who gave verbal consent to proceed.  History of Present Illness  / Interval History:  Since last office visit she is down 5 lbs.   The patient, with a history of prediabetes and obesity, presents for a follow-up visit regarding her obesity treatment plan. She reports a weight loss of five pounds and an increase in muscle mass, which she attributes to a more focused eating plan and increased protein intake. She mentions a persistent sweet tooth, which has been somewhat managed with Metformin. The patient also reports a change in her sense of taste, leading to a decrease in soda consumption and an increase in water and homemade iced tea intake. She has been experiencing constipation, which she believes is related to her medication, Wegovy. She also mentions a recent illness, a severe flu-like virus, which required a  course of Prednisone. The patient expresses concerns about certain diagnoses in her medical records, specifically major depression and kidney disease, which she believes are inaccurately represented and affecting her life insurance policy.    Pharmacotherapy: Wegovy 1 mg weekly. Denies mass in neck, dysphagia, dyspepsia, persistent hoarseness, abdominal pain, or N/V/Constipation or diarrhea. Has annual eye exam. Mood is stable. ]  Metformin for prediabetes. No GI upset or other side effects with metformin.   TREATMENT PLAN FOR OBESITY:  Recommended Dietary Goals  Laysa is currently in the action stage of change. As such, her goal is to continue weight management plan. She has agreed to the Category 3 Plan.  Behavioral Intervention  We discussed the following Behavioral Modification Strategies today: increasing lean protein intake, decreasing simple carbohydrates , increasing vegetables, increasing lower glycemic fruits, increasing fiber rich foods, avoiding skipping meals, increasing water intake, emotional eating strategies and understanding the difference between hunger signals and cravings, work on managing stress, creating time for self-care and relaxation measures, continue to practice mindfulness when eating, and planning for success.  Additional resources provided today: NA  Recommended Physical Activity Goals  Tyreka has been advised to work up to 150 minutes of moderate intensity aerobic activity a week and strengthening exercises 2-3 times per week for cardiovascular health, weight loss maintenance and preservation of muscle mass.   She has agreed to Continue current level of physical activity    Pharmacotherapy We discussed various medication options to help Iyanla with her weight loss  efforts and we both agreed to continue [facility] for weight loss and increase Metformin to twice daily for cravings/prediabetes management.    Return in about 4 weeks (around 12/04/2022).[name] She  was informed of the importance of frequent follow up visits to maximize her success with intensive lifestyle modifications for her multiple health conditions.  PHYSICAL EXAM:  Blood pressure 93/61, pulse 72, temperature 98.1 F (36.7 C), height 5\' 5"  (1.651 m), weight 211 lb (95.7 kg), SpO2 96%. Body mass index is 35.11 kg/m.  General: She is overweight, cooperative, alert, well developed, and in no acute distress. PSYCH: Has normal mood, affect and thought process.   Cardiovascular: HR 70's BP 93/61 Lungs: Normal breathing effort, no conversational dyspnea. Neuro: no focal deficits  DIAGNOSTIC DATA REVIEWED:  BMET    Component Value Date/Time   NA 140 08/16/2022 0809   K 3.6 08/16/2022 0809   CL 101 08/16/2022 0809   CO2 23 08/16/2022 0809   GLUCOSE 87 08/16/2022 0809   BUN 21 08/16/2022 0809   CREATININE 1.29 (H) 08/16/2022 0809   CALCIUM 9.8 08/16/2022 0809   GFRNONAA 54 (L) 01/06/2020 0759   GFRAA 63 01/06/2020 0759   Lab Results  Component Value Date   HGBA1C 5.6 08/16/2022   HGBA1C 5.6 06/23/2019   Lab Results  Component Value Date   INSULIN 23.1 08/16/2022   INSULIN 25.2 (H) 10/16/2021   Lab Results  Component Value Date   TSH 3.070 10/16/2021   CBC    Component Value Date/Time   WBC 8.0 10/16/2021 0828   RBC 4.68 10/16/2021 0828   HGB 13.1 10/16/2021 0828   HCT 40.1 10/16/2021 0828   PLT 285 10/16/2021 0828   MCV 86 10/16/2021 0828   MCH 28.0 10/16/2021 0828   MCHC 32.7 10/16/2021 0828   RDW 12.9 10/16/2021 0828   Iron Studies No results found for: "IRON", "TIBC", "FERRITIN", "IRONPCTSAT" Lipid Panel     Component Value Date/Time   CHOL 206 (H) 12/25/2021 1611   TRIG 114 12/25/2021 1611   HDL 63 12/25/2021 1611   CHOLHDL 3.2 05/01/2017 0825   LDLCALC 123 (H) 12/25/2021 1611   Hepatic Function Panel     Component Value Date/Time   PROT 7.1 08/16/2022 0809   ALBUMIN 4.0 08/16/2022 0809   AST 14 08/16/2022 0809   ALT 13 08/16/2022 0809    ALKPHOS 101 08/16/2022 0809   BILITOT 0.5 08/16/2022 0809      Component Value Date/Time   TSH 3.070 10/16/2021 0828   Nutritional Lab Results  Component Value Date   VD25OH 46.6 08/16/2022   VD25OH 42.6 04/30/2022   VD25OH 35.8 12/25/2021    ASSOCIATED CONDITIONS ADDRESSED TODAY  ASSESSMENT AND PLAN  Problem List Items Addressed This Visit     Morbid obesity with starting BMI 44   Relevant Medications   Semaglutide-Weight Management (WEGOVY) 1 MG/0.5ML SOAJ   Vitamin D deficiency   Relevant Orders   VITAMIN D 25 Hydroxy (Vit-D Deficiency, Fractures)   Insulin resistance - Primary   Relevant Orders   CMP14+EGFR   Hemoglobin A1c   Insulin, random   Depression   Relevant Medications   buPROPion (WELLBUTRIN XL) 300 MG 24 hr tablet   Elevated serum creatinine   Fatigue   Relevant Orders   Vitamin B12   Obesity and Prediabetes Noted improvement in weight loss and muscle mass gain. Patient is on Metformin and Wegovy. Reports cravings for sweets. -Increase Metformin to twice a day to help with cravings. -  Continue Wegovy as currently dosed. -Check B12, A1c, fasting insulin, CMP, and Vitamin D levels today. -Encourage patient to monitor blood pressure at home 2-3 times a week.  Other Depression with Emotional Eating Patient is on Wellbutrin. No side effects noted with wellbutrin and feels helpful with emotional eating/cravings.  -Continue Wellbutrin as currently dosed.  Elevated creatinine.  Elevated creatinine and decreased GFR noted in past labs. Patient reports use of Naproxen. -Repeat kidney function tests today. -Refer to primary care doctor for further workup.  Hypertension Patient is on blood pressure medication- losartan-hydrochlorothiazide 100/25 mg daily. Noted potential for reduction or discontinuation of medication with continued weight loss/treatment with [id number]. -Continue current blood pressure medication. She reports she has follow up  with PCP soon  and will plan to discuss reduction of medication with PCP. Advised to continue to hydrate well and monitor BP at home. -Consider reducing dose if home monitoring shows consistently low readings.  Fatigue: Notes improvement overall in endurance.  Plan: Recheck labs today.   General Health Maintenance -Encourage patient to add weight-bearing exercise to routine, such as wearing ankle weights during walks. -Schedule follow-up appointment in 4 weeks. ATTESTASTION STATEMENTS:  Reviewed by clinician on day of visit: allergies, medications, problem list, medical history, surgical history, family history, social history, and previous encounter notes.   I have personally spent 40 minutes total time today in preparation, patient care, nutritional counseling and documentation for this visit, including the following: review of clinical lab tests; review of medical tests/procedures/services.      Ashlin Hidalgo, PA-C

## 2022-11-07 LAB — CMP14+EGFR
ALT: 13 IU/L (ref 0–32)
AST: 15 IU/L (ref 0–40)
Albumin: 4.4 g/dL (ref 3.8–4.9)
Alkaline Phosphatase: 86 IU/L (ref 44–121)
BUN/Creatinine Ratio: 13 (ref 9–23)
BUN: 18 mg/dL (ref 6–24)
Bilirubin Total: 0.6 mg/dL (ref 0.0–1.2)
CO2: 24 mmol/L (ref 20–29)
Calcium: 10.3 mg/dL — ABNORMAL HIGH (ref 8.7–10.2)
Chloride: 103 mmol/L (ref 96–106)
Creatinine, Ser: 1.41 mg/dL — ABNORMAL HIGH (ref 0.57–1.00)
Globulin, Total: 2.9 g/dL (ref 1.5–4.5)
Glucose: 84 mg/dL (ref 70–99)
Potassium: 4.3 mmol/L (ref 3.5–5.2)
Sodium: 142 mmol/L (ref 134–144)
Total Protein: 7.3 g/dL (ref 6.0–8.5)
eGFR: 44 mL/min/{1.73_m2} — ABNORMAL LOW (ref >59)

## 2022-11-07 LAB — VITAMIN D 25 HYDROXY (VIT D DEFICIENCY, FRACTURES): Vit D, 25-Hydroxy: 59.4 ng/mL (ref 30.0–100.0)

## 2022-11-07 LAB — HEMOGLOBIN A1C
Est. average glucose Bld gHb Est-mCnc: 108 mg/dL
Hgb A1c MFr Bld: 5.4 % (ref 4.8–5.6)

## 2022-11-07 LAB — VITAMIN B12: Vitamin B-12: 311 pg/mL (ref 232–1245)

## 2022-11-07 LAB — INSULIN, RANDOM: INSULIN: 10 u[IU]/mL (ref 2.6–24.9)

## 2022-11-23 ENCOUNTER — Other Ambulatory Visit: Payer: Self-pay

## 2022-11-30 ENCOUNTER — Other Ambulatory Visit: Payer: Self-pay | Admitting: Family Medicine

## 2022-11-30 DIAGNOSIS — K219 Gastro-esophageal reflux disease without esophagitis: Secondary | ICD-10-CM

## 2022-12-03 NOTE — Telephone Encounter (Signed)
Requested Prescriptions  Pending Prescriptions Disp Refills   omeprazole (PRILOSEC) 20 MG capsule [Pharmacy Med Name: OMEPRAZOLE 20MG  CAPSULES] 90 capsule 0    Sig: TAKE 1 CAPSULE(20 MG) BY MOUTH DAILY     Gastroenterology: Proton Pump Inhibitors Passed - 11/30/2022  8:40 PM      Passed - Valid encounter within last 12 months    Recent Outpatient Visits           4 months ago Upper respiratory tract infection, unspecified type   [place] [facility] [place], Oswaldo Conroy, PA-C   5 months ago Essential hypertension   [place] [place], Marzella Schlein, MD   10 months ago Essential hypertension   [place] [facility] [place], Marzella Schlein, MD   11 months ago Encounter for annual physical exam   [place] [facility] [place], Marzella Schlein, MD   1 year ago Essential (primary) hypertension   [place] [facility] [place], Marzella Schlein, MD       Future Appointments             In 3 weeks Bacigalupo, Marzella Schlein, MD [facility]

## 2022-12-06 ENCOUNTER — Other Ambulatory Visit: Payer: Self-pay

## 2022-12-06 ENCOUNTER — Ambulatory Visit (INDEPENDENT_AMBULATORY_CARE_PROVIDER_SITE_OTHER): Payer: Managed Care, Other (non HMO) | Admitting: Physician Assistant

## 2022-12-06 ENCOUNTER — Encounter (INDEPENDENT_AMBULATORY_CARE_PROVIDER_SITE_OTHER): Payer: Self-pay | Admitting: Physician Assistant

## 2022-12-06 VITALS — BP 101/68 | HR 71 | Temp 98.3°F | Ht 65.0 in | Wt 208.0 lb

## 2022-12-06 DIAGNOSIS — Z6834 Body mass index (BMI) 34.0-34.9, adult: Secondary | ICD-10-CM

## 2022-12-06 DIAGNOSIS — R7989 Other specified abnormal findings of blood chemistry: Secondary | ICD-10-CM | POA: Diagnosis not present

## 2022-12-06 DIAGNOSIS — E559 Vitamin D deficiency, unspecified: Secondary | ICD-10-CM

## 2022-12-06 DIAGNOSIS — F32A Depression, unspecified: Secondary | ICD-10-CM

## 2022-12-06 DIAGNOSIS — F3289 Other specified depressive episodes: Secondary | ICD-10-CM

## 2022-12-06 DIAGNOSIS — E88819 Insulin resistance, unspecified: Secondary | ICD-10-CM | POA: Diagnosis not present

## 2022-12-06 MED ORDER — WEGOVY 1 MG/0.5ML ~~LOC~~ SOAJ
1.0000 mg | SUBCUTANEOUS | 1 refills | Status: DC
Start: 2022-12-06 — End: 2023-01-07
  Filled 2022-12-06 – 2022-12-19 (×2): qty 2, 28d supply, fill #0

## 2022-12-06 MED ORDER — VITAMIN D (ERGOCALCIFEROL) 1.25 MG (50000 UNIT) PO CAPS: 50000.0000 [IU] | ORAL_CAPSULE | ORAL | 0 refills | Status: DC

## 2022-12-06 NOTE — Progress Notes (Signed)
.smr  Office: [phone]  /  Fax: [phone]  WEIGHT SUMMARY AND BIOMETRICS  Vitals Temp: 98.3 F (36.8 C) BP: 101/68 Pulse Rate: 71 SpO2: 96 %   Anthropometric Measurements Height: 5\' 5"  (1.651 m) Weight: 208 lb (94.3 kg) BMI (Calculated): 34.61 Weight at Last Visit: 211 lb Weight Lost Since Last Visit: 3 lb Weight Gained Since Last Visit: 0 Starting Weight: 249 lb Total Weight Loss (lbs): 41 lb (18.6 kg) Peak Weight: 249 lb   Body Composition  Body Fat %: 42.7 % Fat Mass (lbs): 89 lbs Muscle Mass (lbs): 113.6 lbs Total Body Water (lbs): 77.2 lbs Visceral Fat Rating : 12   Other Clinical Data Fasting: yes Labs: no Today's Visit #: 15 Starting Date: 10/16/21     HPI  Chief Complaint: OBESITY  Mackenzie Newman is here to discuss her progress with her obesity treatment plan. She is on the the Category 3 Plan and states she is following her eating plan approximately 50 % of the time. She states she is exercising walking stairs 30 minutes 5 times per week.   Interval History:  Since last office visit she is down 3 lbs.    The patient, with a history of obesity and kidney issues, presents for a follow-up visit. They have been on [facility] for weight loss and report some nausea and vomiting, which they have linked to eating after long periods of fasting. They also report a change in their sense of taste. They have been mindful of their protein intake, but admit they could do better. They have recently had a Mohs procedure for skin cancer and have been diligent about protecting their skin from the sun. They report a decline in kidney function, as indicated by recent tests. They are also on metformin, which they feel helps with cravings, and vitamin D supplements. They have set a personal goal to get down to 200 pounds by the end of the year.  Pharmacotherapy: Wegovy 1 mg weekly.   TREATMENT PLAN FOR OBESITY:  Recommended Dietary Goals  [name] is currently in the action  stage of change. As such, her goal is to continue weight management plan. She has agreed to the Category 3 Plan.  Behavioral Intervention  We discussed the following Behavioral Modification Strategies today: increasing lean protein intake, decreasing simple carbohydrates , increasing vegetables, increasing lower glycemic fruits, avoiding skipping meals, increasing water intake, decreasing eating out or consumption of processed foods, and making healthy choices when eating convenient foods, emotional eating strategies and understanding the difference between hunger signals and cravings, continue to practice mindfulness when eating, and planning for success.  Additional resources provided today: NA  Recommended Physical Activity Goals  Milanni has been advised to work up to 150 minutes of moderate intensity aerobic activity a week and strengthening exercises 2-3 times per week for cardiovascular health, weight loss maintenance and preservation of muscle mass.   She has agreed to Continue current level of physical activity  and Increase physical activity in their day and reduce sedentary time (increase NEAT).   Pharmacotherapy We discussed various medication options to help Ivy with her weight loss efforts and we both agreed to continue Wegovy 1 mg weekly for medical weight loss.    Return in about 4 weeks (around 01/03/2023).[name] She was informed of the importance of frequent follow up visits to maximize her success with intensive lifestyle modifications for her multiple health conditions.  PHYSICAL EXAM:  Blood pressure 101/68, pulse 71, temperature 98.3 F (36.8 C), height 5'  5" (1.651 m), weight 208 lb (94.3 kg), SpO2 96%. Body mass index is 34.61 kg/m.  General: She is overweight, cooperative, alert, well developed, and in no acute distress. PSYCH: Has normal mood, affect and thought process.   Cardiovascular: HR 70's BP 101/68 Lungs: Normal breathing effort, no conversational  dyspnea. Neuro: no focal deficits  DIAGNOSTIC DATA REVIEWED:  BMET    Component Value Date/Time   NA 142 11/06/2022 0858   K 4.3 11/06/2022 0858   CL 103 11/06/2022 0858   CO2 24 11/06/2022 0858   GLUCOSE 84 11/06/2022 0858   BUN 18 11/06/2022 0858   CREATININE 1.41 (H) 11/06/2022 0858   CALCIUM 10.3 (H) 11/06/2022 0858   GFRNONAA 54 (L) 01/06/2020 0759   GFRAA 63 01/06/2020 0759   Lab Results  Component Value Date   HGBA1C 5.4 11/06/2022   HGBA1C 5.6 06/23/2019   Lab Results  Component Value Date   INSULIN 10.0 11/06/2022   INSULIN 25.2 (H) 10/16/2021   Lab Results  Component Value Date   TSH 3.070 10/16/2021   CBC    Component Value Date/Time   WBC 8.0 10/16/2021 0828   RBC 4.68 10/16/2021 0828   HGB 13.1 10/16/2021 0828   HCT 40.1 10/16/2021 0828   PLT 285 10/16/2021 0828   MCV 86 10/16/2021 0828   MCH 28.0 10/16/2021 0828   MCHC 32.7 10/16/2021 0828   RDW 12.9 10/16/2021 0828   Iron Studies No results found for: "IRON", "TIBC", "FERRITIN", "IRONPCTSAT" Lipid Panel     Component Value Date/Time   CHOL 206 (H) 12/25/2021 1611   TRIG 114 12/25/2021 1611   HDL 63 12/25/2021 1611   CHOLHDL 3.2 05/01/2017 0825   LDLCALC 123 (H) 12/25/2021 1611   Hepatic Function Panel     Component Value Date/Time   PROT 7.3 11/06/2022 0858   ALBUMIN 4.4 11/06/2022 0858   AST 15 11/06/2022 0858   ALT 13 11/06/2022 0858   ALKPHOS 86 11/06/2022 0858   BILITOT 0.6 11/06/2022 0858      Component Value Date/Time   TSH 3.070 10/16/2021 0828   Nutritional Lab Results  Component Value Date   VD25OH 59.4 11/06/2022   VD25OH 46.6 08/16/2022   VD25OH 42.6 04/30/2022    ASSOCIATED CONDITIONS ADDRESSED TODAY  ASSESSMENT AND PLAN  Problem List Items Addressed This Visit     Morbid obesity with starting BMI 44   Relevant Medications   Semaglutide-Weight Management (WEGOVY) 1 MG/0.5ML SOAJ   Vitamin D deficiency   Relevant Medications   Vitamin D,  Ergocalciferol, (DRISDOL) 1.25 MG (50000 UNIT) CAPS capsule   Insulin resistance - Primary   Depression   Elevated serum creatinine   BMI 34.0-34.9,adult Current BMI 34.7  Obesity Patient is on Wegovy 1mg  with some nausea and vomiting, particularly when fasting. Maintaining muscle mass and decreasing adipose tissue. Visceral adipose rating decreased from 15 to 12. Insulin level decreased from 23 to 10. Hemoglobin A1c is 5.4. -Continue/Refill  Wegovy 1mg . Discontinue metformin due to nausea.  -Consider increasing Wegovy dose if appetite increases after discontinuing Metformin. -Check weight and discuss Wegovy dose adjustment at next visit.  Vitamin D deficiency Current level is within normal range. -Reduce Vitamin D supplementation to once every 14 days.  Vitamin B12 deficiency Current level is 311, which is on the lower end of the normal range. -Start over-the-counter Vitamin B12 sublingual daily.  Renal insufficiency Decline in glomerular filtration rate from 55 to 44. -Discontinue Metformin due to potential nephrotoxicity. -  Refer to primary care provider (Dr. Fabio Pierce) for further evaluation and potential referral to a specialist. -Stay well hydrated with 80 ounces of fluids daily.  Skin cancer Recent Mohs procedure for presumed basal cell carcinoma. -Use daily facial sunscreen. -Continue to protect skin from sun exposure, particularly between 10am and 2pm.  Follow-up in 5 weeks on Monday, January 07, 2023.  ATTESTASTION STATEMENTS:  Reviewed by clinician on day of visit: allergies, medications, problem list, medical history, surgical history, family history, social history, and previous encounter notes.   I have personally spent 30 minutes total time today in preparation, patient care, nutritional counseling and documentation for this visit, including the following: review of clinical lab tests; review of medical tests/procedures/services.      Marik Sedore,  PA-C

## 2022-12-19 ENCOUNTER — Other Ambulatory Visit: Payer: Self-pay

## 2022-12-24 ENCOUNTER — Encounter: Payer: Self-pay | Admitting: Family Medicine

## 2022-12-24 ENCOUNTER — Ambulatory Visit: Payer: Managed Care, Other (non HMO) | Admitting: Family Medicine

## 2022-12-24 VITALS — BP 118/51 | HR 68 | Ht 65.0 in | Wt 211.4 lb

## 2022-12-24 DIAGNOSIS — E559 Vitamin D deficiency, unspecified: Secondary | ICD-10-CM

## 2022-12-24 DIAGNOSIS — N1831 Chronic kidney disease, stage 3a: Secondary | ICD-10-CM

## 2022-12-24 DIAGNOSIS — I10 Essential (primary) hypertension: Secondary | ICD-10-CM

## 2022-12-24 DIAGNOSIS — F3342 Major depressive disorder, recurrent, in full remission: Secondary | ICD-10-CM

## 2022-12-24 DIAGNOSIS — E7800 Pure hypercholesterolemia, unspecified: Secondary | ICD-10-CM

## 2022-12-24 DIAGNOSIS — Z23 Encounter for immunization: Secondary | ICD-10-CM | POA: Diagnosis not present

## 2022-12-24 DIAGNOSIS — R7303 Prediabetes: Secondary | ICD-10-CM

## 2022-12-24 NOTE — Assessment & Plan Note (Signed)
Elevated creatinine levels noted, indicating possible kidney damage. Discussed potential contributing factors including hydration status and medication use. No current use of NSAIDs reported. -Encourage increased hydration. -Order repeat creatinine level and urine albumin to creatinine ratio to assess kidney function. -Consider referral to nephrology if proteinuria is present.

## 2022-12-24 NOTE — Assessment & Plan Note (Signed)
No recent cholesterol check. -Order lipid panel.

## 2022-12-24 NOTE — Assessment & Plan Note (Signed)
Well controlled on Losartan HCTZ. Discussed potential need to adjust medication as weight loss continues. -Continue Losartan HCTZ. Monitor blood pressure and adjust medication as needed.

## 2022-12-24 NOTE — Assessment & Plan Note (Signed)
Last A1c wnl - continue to monitor

## 2022-12-24 NOTE — Assessment & Plan Note (Signed)
Well controlled on Wellbutrin. -Continue Wellbutrin.

## 2022-12-24 NOTE — Progress Notes (Signed)
Established Patient Office Visit  Subjective   Patient ID: Mackenzie Newman, female    DOB: [birth year]  Age: 57 y.o. MRN: [id number]  Chief Complaint  Patient presents with   Follow-up    HPI  Discussed the use of AI scribe software for clinical note transcription with the patient, who gave verbal consent to proceed.  History of Present Illness   The patient, with a history of hypertension and mood disorder, has been on a successful weight loss journey, losing almost forty pounds. She is currently on Wegovy for weight loss, losartan HCTZ for blood pressure, and Wellbutrin for mood. The patient reports no problems with the [facility] and is on a one milligram dose. The blood pressure is well controlled on the losartan HCTZ, and the patient reports that the Wellbutrin is still going well for mood.  However, there are concerns about the patient's kidney function. Over the past year, the patient's kidney function has been creeping up. The patient reports that she predominantly drinks water now, about six glasses a day, and has cut out soda completely. She also reports that she is not taking any NSAIDs like ibuprofen or Aleve.         ROS    Objective:     BP (!) 118/51   Pulse 68   Ht 5\' 5"  (1.651 m)   Wt 211 lb 6.4 oz (95.9 kg)   SpO2 100%   BMI 35.18 kg/m    Physical Exam Vitals reviewed.  Constitutional:      General: She is not in acute distress.    Appearance: Normal appearance. She is well-developed. She is not diaphoretic.  HENT:     Head: Normocephalic and atraumatic.  Eyes:     General: No scleral icterus.    Conjunctiva/sclera: Conjunctivae normal.  Neck:     Thyroid: No thyromegaly.  Cardiovascular:     Rate and Rhythm: Normal rate and regular rhythm.     Heart sounds: Normal heart sounds. No murmur heard. Pulmonary:     Effort: Pulmonary effort is normal. No respiratory distress.     Breath sounds: Normal breath sounds. No wheezing, rhonchi or rales.   Musculoskeletal:     Cervical back: Neck supple.     Right lower leg: No edema.     Left lower leg: No edema.  Lymphadenopathy:     Cervical: No cervical adenopathy.  Skin:    General: Skin is warm and dry.     Findings: No rash.  Neurological:     Mental Status: She is alert and oriented to person, place, and time. Mental status is at baseline.  Psychiatric:        Mood and Affect: Mood normal.        Behavior: Behavior normal.      No results found for any visits on 12/24/22.    The 10-year ASCVD risk score (Arnett DK, et al., 2019) is: 2.1%    Assessment & Plan:   Problem List Items Addressed This Visit       Cardiovascular and Mediastinum   Essential hypertension - Primary    Well controlled on Losartan HCTZ. Discussed potential need to adjust medication as weight loss continues. -Continue Losartan HCTZ. Monitor blood pressure and adjust medication as needed.      Relevant Orders   Comprehensive metabolic panel     Genitourinary   Stage 3a chronic kidney disease (HCC)    Elevated creatinine levels noted, indicating possible kidney damage. Discussed potential contributing  factors including hydration status and medication use. No current use of NSAIDs reported. -Encourage increased hydration. -Order repeat creatinine level and urine albumin to creatinine ratio to assess kidney function. -Consider referral to nephrology if proteinuria is present.      Relevant Orders   Comprehensive metabolic panel   Urine Microalbumin w/creat. ratio     Other   Hyperlipidemia    No recent cholesterol check. -Order lipid panel.      Relevant Orders   Comprehensive metabolic panel   Lipid panel   Vitamin D deficiency   Pre-diabetes    Last A1c wnl - continue to monitor      Depression    Well controlled on Wellbutrin. -Continue Wellbutrin.      Other Visit Diagnoses     Need for influenza vaccination       Relevant Orders   Flu vaccine trivalent PF, 6mos  and older(Flulaval,Afluria,Fluarix,Fluzone) (Completed)           Weight Management Significant weight loss of almost 40 pounds, secondary to [facility] 1mg  daily. No reported side effects. -Continue Wegovy 1mg  daily.  General Health Maintenance -Continue annual flu vaccination (already received for this year). -Schedule follow-up visit in 6 months.        Return in about 6 months (around 06/24/2023) for CPE.    Shirlee Latch, MD

## 2022-12-25 LAB — COMPREHENSIVE METABOLIC PANEL
ALT: 15 [IU]/L (ref 0–32)
AST: 19 [IU]/L (ref 0–40)
Albumin: 4.3 g/dL (ref 3.8–4.9)
Alkaline Phosphatase: 87 [IU]/L (ref 44–121)
BUN/Creatinine Ratio: 15 (ref 9–23)
BUN: 18 mg/dL (ref 6–24)
Bilirubin Total: 0.4 mg/dL (ref 0.0–1.2)
CO2: 24 mmol/L (ref 20–29)
Calcium: 10.3 mg/dL — ABNORMAL HIGH (ref 8.7–10.2)
Chloride: 102 mmol/L (ref 96–106)
Creatinine, Ser: 1.24 mg/dL — ABNORMAL HIGH (ref 0.57–1.00)
Globulin, Total: 3 g/dL (ref 1.5–4.5)
Glucose: 92 mg/dL (ref 70–99)
Potassium: 4.2 mmol/L (ref 3.5–5.2)
Sodium: 141 mmol/L (ref 134–144)
Total Protein: 7.3 g/dL (ref 6.0–8.5)
eGFR: 51 mL/min/{1.73_m2} — ABNORMAL LOW (ref >59)

## 2022-12-25 LAB — LIPID PANEL
Chol/HDL Ratio: 3.4 ratio (ref 0.0–4.4)
Cholesterol, Total: 225 mg/dL — ABNORMAL HIGH (ref 100–199)
HDL: 66 mg/dL (ref >39)
LDL Chol Calc (NIH): 141 mg/dL — ABNORMAL HIGH (ref 0–99)
Triglycerides: 100 mg/dL (ref 0–149)
VLDL Cholesterol Cal: 18 mg/dL (ref 5–40)

## 2022-12-25 LAB — MICROALBUMIN / CREATININE URINE RATIO
Creatinine, Urine: 165.2 mg/dL
Microalb/Creat Ratio: 2 mg/g{creat} (ref 0–29)
Microalbumin, Urine: 3.9 ug/mL

## 2023-01-07 ENCOUNTER — Encounter (INDEPENDENT_AMBULATORY_CARE_PROVIDER_SITE_OTHER): Payer: Self-pay | Admitting: Physician Assistant

## 2023-01-07 ENCOUNTER — Ambulatory Visit (INDEPENDENT_AMBULATORY_CARE_PROVIDER_SITE_OTHER): Payer: Managed Care, Other (non HMO) | Admitting: Physician Assistant

## 2023-01-07 ENCOUNTER — Other Ambulatory Visit: Payer: Self-pay

## 2023-01-07 VITALS — BP 94/60 | HR 83 | Temp 98.5°F | Ht 65.0 in | Wt 206.0 lb

## 2023-01-07 DIAGNOSIS — Z6834 Body mass index (BMI) 34.0-34.9, adult: Secondary | ICD-10-CM

## 2023-01-07 DIAGNOSIS — I10 Essential (primary) hypertension: Secondary | ICD-10-CM | POA: Diagnosis not present

## 2023-01-07 DIAGNOSIS — R7303 Prediabetes: Secondary | ICD-10-CM

## 2023-01-07 DIAGNOSIS — E559 Vitamin D deficiency, unspecified: Secondary | ICD-10-CM | POA: Diagnosis not present

## 2023-01-07 DIAGNOSIS — K5900 Constipation, unspecified: Secondary | ICD-10-CM | POA: Diagnosis not present

## 2023-01-07 DIAGNOSIS — F3289 Other specified depressive episodes: Secondary | ICD-10-CM

## 2023-01-07 MED ORDER — VITAMIN D (ERGOCALCIFEROL) 1.25 MG (50000 UNIT) PO CAPS: 50000.0000 [IU] | ORAL_CAPSULE | ORAL | 0 refills | Status: DC

## 2023-01-07 MED ORDER — WEGOVY 1 MG/0.5ML ~~LOC~~ SOAJ
1.0000 mg | SUBCUTANEOUS | 1 refills | Status: DC
  Filled 2023-01-07: qty 2, 28d supply, fill #0

## 2023-01-07 MED ORDER — METFORMIN HCL 500 MG PO TABS: 500.0000 mg | ORAL_TABLET | Freq: Every day | ORAL | 0 refills | Status: DC

## 2023-01-07 MED ORDER — BUPROPION HCL ER (XL) 300 MG PO TB24: 300.0000 mg | ORAL_TABLET | Freq: Every day | ORAL | 0 refills | Status: DC

## 2023-01-07 NOTE — Progress Notes (Signed)
.smr  Office: [phone]  /  Fax: [phone]  WEIGHT SUMMARY AND BIOMETRICS  Vitals Temp: 98.5 F (36.9 C) BP: 94/60 Pulse Rate: 83 SpO2: 99 %   Anthropometric Measurements Height: 5\' 5"  (1.651 m) Weight: 206 lb (93.4 kg) BMI (Calculated): 34.28 Weight at Last Visit: 208 lb Weight Lost Since Last Visit: 2 lb Weight Gained Since Last Visit: 0 Starting Weight: 249 lb Total Weight Loss (lbs): 43 lb (19.5 kg) Peak Weight: 249 lb   Body Composition  Body Fat %: 42.4 % Fat Mass (lbs): 87.6 lbs Muscle Mass (lbs): 113 lbs Total Body Water (lbs): 76.8 lbs Visceral Fat Rating : 11   Other Clinical Data Fasting: yes Labs: no Today's Visit #: 16 Starting Date: 10/16/21     HPI  Chief Complaint: OBESITY  Mackenzie Newman is here to discuss her progress with her obesity treatment plan. She is on the the Category 3 Plan and states she is following her eating plan approximately 55 % of the time. She states she is exercising walking 30 minutes 4 times per week.  Discussed the use of AI scribe software for clinical note transcription with the patient, who gave verbal consent to proceed.  History of Present Illness  /   Interval History:  Since last office visit she is down 2 lbs.   The patient, with a history of obesity, prediabetes, hypertension, vitamin D deficiency, and emotional eating behavior, presents for a follow-up visit.  She reports some improvement in appetite suppression and weight loss while on Wegovy. However, she has stopped taking metformin, which has led to constipation while taking Wegovy. She expresses a desire to resume metformin to alleviate this issue.  The patient also reports a significant reduction in soda consumption, which she attributes to a change in taste preference.  She has noticed a dramatic decrease in portion sizes during meals and has been making an effort not to skip meals. The patient's goal is to reduce her weight to under 200 pounds by the  end of December.  Pharmacotherapy: Wegovy 1 mg weekly. Denies mass in neck, dysphagia, dyspepsia, persistent hoarseness, abdominal pain, or N/V or diarrhea. Has annual eye exam. Mood is stable. Notes constipation. Was not a problem when also taking metformin.  Will resume metformin, but need to follow renal function closely following resumption.    TREATMENT PLAN FOR OBESITY: Down a total of 43 lbs. TBW loss of 17.2% !  Obesity Significant weight loss achieved (43 lbs, 17% of body weight) with Wegovy 1mg  weekly and lifestyle modifications. Noted decreased appetite and occasional nausea if meals are skipped. -Continue Wegovy 1mg  weekly. -Encouraged to maintain regular meals, hydration, and incorporate strength training exercises. Recommended Dietary Goals  Mackenzie Newman is currently in the action stage of change. As such, her goal is to continue weight management plan. She has agreed to the Category 3 Plan.  Behavioral Intervention  We discussed the following Behavioral Modification Strategies today: continue to work on maintaining a reduced calorie state, getting the recommended amount of protein, incorporating whole foods, making healthy choices, staying well hydrated and practicing mindfulness when eating..  Additional resources provided today: NA  Recommended Physical Activity Goals  Mackenzie Newman has been advised to work up to 150 minutes of moderate intensity aerobic activity a week and strengthening exercises 2-3 times per week for cardiovascular health, weight loss maintenance and preservation of muscle mass.   She has agreed to Increase physical activity in their day and reduce sedentary time (increase NEAT). and Start strengthening  exercises with a goal of 2-3 sessions a week    Pharmacotherapy We discussed various medication options to help Mackenzie Newman with her weight loss efforts and we both agreed to continue Wegovy 1 mg weekly and resume metformin 500 mg once daily.    Return in about 4  weeks (around 02/04/2023).[name] She was informed of the importance of frequent follow up visits to maximize her success with intensive lifestyle modifications for her multiple health conditions.  PHYSICAL EXAM:  Blood pressure 94/60, pulse 83, temperature 98.5 F (36.9 C), height 5\' 5"  (1.651 m), weight 206 lb (93.4 kg), SpO2 99%. Body mass index is 34.28 kg/m.  General: She is overweight, cooperative, alert, well developed, and in no acute distress. PSYCH: Has normal mood, affect and thought process.   Cardiovascular: HR 80's BP 94/60 Lungs: Normal breathing effort, no conversational dyspnea. Neuro: no focal deficits  DIAGNOSTIC DATA REVIEWED:  BMET    Component Value Date/Time   NA 141 12/24/2022 0938   K 4.2 12/24/2022 0938   CL 102 12/24/2022 0938   CO2 24 12/24/2022 0938   GLUCOSE 92 12/24/2022 0938   BUN 18 12/24/2022 0938   CREATININE 1.24 (H) 12/24/2022 0938   CALCIUM 10.3 (H) 12/24/2022 0938   GFRNONAA 54 (L) 01/06/2020 0759   GFRAA 63 01/06/2020 0759   Lab Results  Component Value Date   HGBA1C 5.4 11/06/2022   HGBA1C 5.6 06/23/2019   Lab Results  Component Value Date   INSULIN 10.0 11/06/2022   INSULIN 25.2 (H) 10/16/2021   Lab Results  Component Value Date   TSH 3.070 10/16/2021   CBC    Component Value Date/Time   WBC 8.0 10/16/2021 0828   RBC 4.68 10/16/2021 0828   HGB 13.1 10/16/2021 0828   HCT 40.1 10/16/2021 0828   PLT 285 10/16/2021 0828   MCV 86 10/16/2021 0828   MCH 28.0 10/16/2021 0828   MCHC 32.7 10/16/2021 0828   RDW 12.9 10/16/2021 0828   Iron Studies No results found for: "IRON", "TIBC", "FERRITIN", "IRONPCTSAT" Lipid Panel     Component Value Date/Time   CHOL 225 (H) 12/24/2022 0938   TRIG 100 12/24/2022 0938   HDL 66 12/24/2022 0938   CHOLHDL 3.4 12/24/2022 0938   LDLCALC 141 (H) 12/24/2022 0938   Hepatic Function Panel     Component Value Date/Time   PROT 7.3 12/24/2022 0938   ALBUMIN 4.3 12/24/2022 0938   AST 19  12/24/2022 0938   ALT 15 12/24/2022 0938   ALKPHOS 87 12/24/2022 0938   BILITOT 0.4 12/24/2022 0938      Component Value Date/Time   TSH 3.070 10/16/2021 0828   Nutritional Lab Results  Component Value Date   VD25OH 59.4 11/06/2022   VD25OH 46.6 08/16/2022   VD25OH 42.6 04/30/2022    ASSOCIATED CONDITIONS ADDRESSED TODAY  ASSESSMENT AND PLAN  Problem List Items Addressed This Visit     Morbid obesity with starting BMI 44   Relevant Medications   Semaglutide-Weight Management (WEGOVY) 1 MG/0.5ML SOAJ   metFORMIN (GLUCOPHAGE) 500 MG tablet   Vitamin D deficiency   Relevant Medications   Vitamin D, Ergocalciferol, (DRISDOL) 1.25 MG (50000 UNIT) CAPS capsule   Essential hypertension   Pre-diabetes - Primary   Relevant Medications   metFORMIN (GLUCOPHAGE) 500 MG tablet   Depression   Relevant Medications   buPROPion (WELLBUTRIN XL) 300 MG 24 hr tablet   BMI 34.0-34.9,adult Current BMI 34.7   Prediabetes Improved A1C, now out of prediabetic range.  Insulin level approaching goal. -Resume Metformin 500 mg once daily. for additional hunger suppression and insulin resistance management. -Check kidney function (B-Met) at next visit due to Metformin use.  Hypertension Stable blood pressure losartan-hydrochlorothiazide 100-25 mg daily. BP in low normal range without side effect. Reports no symptoms or signs of hypotension.  -Continue current antihypertensive medication. May see significant lowering on BP on GLP 1 RA medication and with significant weight loss.  -Monitor for symptoms of hypotension and may need to lower dose of medication in near future.   Vitamin D deficiency On Vitamin D supplementation Ergocalciferol 50,000 units  every 14 days. No N/V or muscle weakness on Ergocalciferol.  -Continue/refill Ergocalciferol 50,000 units every 14 days.  Low vitamin D levels can be associated with adiposity and may result in leptin resistance and weight gain. Also associated  with fatigue. Currently on vitamin D supplementation without any adverse effects.  -Recheck Vitamin D levels several times yearly to optimize supplementation/avoid over supplementation.   Hyperlipidemia Hyperlipidemia LDL is not at goal. Medication(s): No statin currently. On Wegovy Cardiovascular risk factors: dyslipidemia, hypertension, and obesity (BMI >= 30 kg/m2) Slight worsening of LDL, improvement in HDL. No immediate need for statin therapy. -Monitor lipid panel, especially with resumption of Metformin. Lab Results  Component Value Date   CHOL 225 (H) 12/24/2022   HDL 66 12/24/2022   LDLCALC 141 (H) 12/24/2022   TRIG 100 12/24/2022   CHOLHDL 3.4 12/24/2022   CHOLHDL 3.2 05/01/2017   Lab Results  Component Value Date   ALT 15 12/24/2022   AST 19 12/24/2022   ALKPHOS 87 12/24/2022   BILITOT 0.4 12/24/2022   The 10-year ASCVD risk score (Arnett DK, et al., 2019) is: 1.5%   Values used to calculate the score:     Age: 57 years     Sex: Female     Is Non-Hispanic African American: No     Diabetic: No     Tobacco smoker: No     Systolic Blood Pressure: 94 mmHg     Is BP treated: Yes     HDL Cholesterol: 66 mg/dL     Total Cholesterol: 225 mg/dL  Plan: No statin currently indicated.  Continue to work on nutrition plan -decreasing simple carbohydrates, increasing lean proteins, decreasing saturated fats and cholesterol , avoiding trans fats and exercise as able to promote weight loss, improve lipids and decrease cardiovascular risks. Continue Wegovy to reduce CV risks.    Constipation Reported constipation since stopping Metformin. Did not have an issue with constipation when taking both metformin and wegovy. Advised will resume metformin and monitor renal function closely. Advised to increase fiber and water consumption.  -Resume Metformin which should also help with constipation.  Vitamin D Deficiency Vitamin D is at goal of 50.  Most recent vitamin D level was  59.4. She is on  prescription ergocalciferol 50,000 IU every 14 days. Lab Results  Component Value Date   VD25OH 59.4 11/06/2022   VD25OH 46.6 08/16/2022   VD25OH 42.6 04/30/2022    Plan: Continue and refill  prescription ergocalciferol 50,000 IU every 14 days Low vitamin D levels can be associated with adiposity and may result in leptin resistance and weight gain. Also associated with fatigue. Currently on vitamin D supplementation without any adverse effects.  Recheck vitamin D level several times yearly to optimize supplementation/avoid over supplementation.   Other depression/emotional eating Crystal has had issues with stress/emotional eating. Currently this is moderately controlled. Overall mood is stable. Medication(s): Bupropion  XL 300 mg daily No side effects with bupropion.   Plan: Continue and refill Bupropion XL 300 mg daily Continue to work on emotional eating strategies.   General Health Maintenance -Start B12 supplementation due to Metformin use. -Schedule follow-up visit for December 4th and January 6th. -Consider use of weighted vest or ankle weights for exercise.  ATTESTASTION STATEMENTS:  Reviewed by clinician on day of visit: allergies, medications, problem list, medical history, surgical history, family history, social history, and previous encounter notes.   I have personally spent 50 minutes total time today in preparation, patient care, nutritional counseling and documentation for this visit, including the following: review of clinical lab tests; review of medical tests/procedures/services.      Marlise Fahr, PA-C

## 2023-01-08 ENCOUNTER — Other Ambulatory Visit: Payer: Self-pay

## 2023-01-15 ENCOUNTER — Encounter (INDEPENDENT_AMBULATORY_CARE_PROVIDER_SITE_OTHER): Payer: Self-pay | Admitting: Physician Assistant

## 2023-01-15 DIAGNOSIS — R7303 Prediabetes: Secondary | ICD-10-CM

## 2023-01-16 MED ORDER — METFORMIN HCL 500 MG PO TABS: 500.0000 mg | ORAL_TABLET | Freq: Every day | ORAL | 0 refills | Status: DC

## 2023-01-16 NOTE — Telephone Encounter (Signed)
Mackenzie Newman, patient is requesting a 90 day supply, insurance is requiring a 90 day supply.  Medication is pended, please approve. Thank you.  Darreld Mclean

## 2023-01-16 NOTE — Addendum Note (Signed)
Addended by: Teressa Senter on: 01/16/2023 11:13 AM   Modules accepted: Orders

## 2023-02-05 NOTE — Progress Notes (Unsigned)
SUBJECTIVE:  Chief Complaint: Obesity  Interim History: She has maintained since her last visit.  Down 43 lbs overall TBW loss of 17.3% Mackenzie Newman is a 57 year old female with a history of obesity, prediabetes, insulin resistance, and hypertension who presents for follow up of her obesity treatment plan. She is currently on a treatment regimen that includes Wegovy 1mg  weekly, Metformin 500mg  daily, Wellbutrin 300mg  daily for emotional eating and cravings, and Vitamin D supplementation every 14 days.  The patient reports a successful implementation of portion control during the recent holiday season, which resulted in not feeling the need to go back for seconds and not being able to finish the food on her plate.  However, she also reports a plateau in her weight loss journey . She expresses concerns about increased cravings, particularly for sugar, and a struggle with maintaining a high protein diet. The patient also reports instances of vomiting if she goes too long without eating, occurring approximately twice a month. She has identified the trigger as not eating for extended periods and is making efforts to eat immediately when she feels the need to.We discussed the importance of regular meals and adequate protein intake for weight loss and overall health.   The patient has also noticed changes in her sleep patterns. She reports being able to fall asleep without difficulty but wakes up between 2:30 and 3:00 am and sometimes struggles to fall back asleep. She denies any worrying or ruminating thoughts during these periods of wakefulness. She also reports a change in her taste for sweet foods, where she no longer finds them satisfying. We discussed getting back in to see her provider for follow up of OSA treatment as she is using CPAP nightly, but has not had follow up since it was prescribed in 2021.   The patient has been inactive recently due to her spouse's health issues, which has been  her primary focus. She anticipates being able to resume physical activity in the near future. She has lost 43 pounds so far in her weight loss journey.  Sylvester is here to discuss her progress with her obesity treatment plan. She is on the Category 3 Plan and states she is following her eating plan approximately 50 % of the time. She states she is not exercising 0 minutes 0 times per week.  Plan to repeat fasting labs at next appointment in Jan. 2025   Pharmacotherapy: Wegovy 1 mg weekly. Denies mass in neck, dysphagia, dyspepsia, persistent hoarseness, abdominal pain,  or diarrhea. Has annual eye exam. Mood is stable. Notes episodic N/V if skips meal or goes too long between meals . Constipation improved back on metformin.   Will resume metformin, but need to follow renal function closely following resumption.   OBJECTIVE: Visit Diagnoses: Problem List Items Addressed This Visit     Morbid obesity with starting BMI 44   Relevant Medications   Semaglutide-Weight Management (WEGOVY) 1 MG/0.5ML SOAJ   Vitamin D deficiency   Relevant Medications   Vitamin D, Ergocalciferol, (DRISDOL) 1.25 MG (50000 UNIT) CAPS capsule   Essential hypertension   Pre-diabetes - Primary   Depression   Relevant Medications   buPROPion (WELLBUTRIN XL) 300 MG 24 hr tablet  Obesity 57 year old with obesity on Wegovy 1 mg weekly. Lost 43 pounds but plateaued over 2.5 months. Reports increased sugar cravings and occasional vomiting when going too long between meals. Not engaging in physical activity due to caregiving responsibilities. Discussed potential increase to 1.7 mg if no improvement in  one month but hesitant to increase with N/V and potential for skipping more meals and decreasing muscle mass. Risks include increased vomiting; benefits include further weight loss. Emphasized protein intake and smaller, frequent meals to manage cravings and prevent muscle mass loss. - Continue/refill Wegovy 1 mg weekly -  Reassess in one month; consider increasing to 1.7 mg if no improvement - Increase protein intake with 100-calorie protein snacks - Distribute protein intake into smaller meals throughout the day - Encourage resumption of physical activity as able  Emotional Eating/Cravings On Wellbutrin 300 mg daily for emotional eating and cravings. Reports increased sugar cravings recently. Discussed potential impact of stress and caregiving responsibilities. - Continue/refill Wellbutrin 300 mg daily   Prediabetes  Lab Results  Component Value Date   HGBA1C 5.4 11/06/2022   HGBA1C 5.6 08/16/2022   HGBA1C 5.7 (H) 04/30/2022   Lab Results  Component Value Date   LDLCALC 141 (H) 12/24/2022   CREATININE 1.24 (H) 12/24/2022   On Wegovy 1 mg weekly for medical weight loss and has had improvement in A1C as expected as well.  Some loss of muscle mass over the past month, so  Prediabetes on metformin 500 mg daily. Kidney function monitoring needed due to metformin. Discussed importance of monitoring kidney function to prevent renal complications. - Recheck kidney function in January - Perform fasting labs at next visit    Vitamin D Deficiency Vitamin D is at goal of 50.  Most recent vitamin D level was 59.4. She is on  prescription ergocalciferol 50,000 IU every 14 days. Lab Results  Component Value Date   VD25OH 59.4 11/06/2022   VD25OH 46.6 08/16/2022   VD25OH 42.6 04/30/2022    Plan: Continue and refill  prescription ergocalciferol 50,000 IU every 14 days Low vitamin D levels can be associated with adiposity and may result in leptin resistance and weight gain. Also associated with fatigue. Currently on vitamin D supplementation without any adverse effects.  On ergocalciferol 50,000 units every 14 days. Discussed rechecking levels to ensure adequacy. - Recheck vitamin D levels in January  OSA using CPAP/ Insomnia Reports difficulty staying asleep, waking around 2:30-3:00 AM. Tried melatonin  without sustained effect, occasionally uses Ambien. Possible contributing factors include room temperature changes and potential serotonin level changes due to [facility]. Discussed potential benefits of time-release melatonin and sunlight exposure. Risks of Ambien include dependency and tolerance. - Consider time-release melatonin 3 mg - Encourage 20 minutes of sunlight exposure in late morning - Check CPAP settings; consider follow-up with sleep specialist - Temporarily use Ambien if necessary for multiple nights of poor sleep  Follow-up - Follow-up appointment on January 6th at 7:30 AM   Vitals Temp: 97.8 F (36.6 C) BP: 115/80 Pulse Rate: 77 SpO2: 94 %   Anthropometric Measurements Height: 5\' 5"  (1.651 m) Weight: 206 lb (93.4 kg) BMI (Calculated): 34.28 Weight at Last Visit: 206 lb Weight Lost Since Last Visit: 0 Weight Gained Since Last Visit: 0 Starting Weight: 249 lb Total Weight Loss (lbs): 43 lb (19.5 kg) Peak Weight: 249 lb   Body Composition  Body Fat %: 42.9 % Fat Mass (lbs): 88.4 lbs Muscle Mass (lbs): 111.8 lbs Total Body Water (lbs): 77.2 lbs Visceral Fat Rating : 12   Other Clinical Data Fasting: yes Labs: no Today's Visit #: 17 Starting Date: 10/17/22     ASSESSMENT AND PLAN:  Diet: Kendahl is currently in the action stage of change. As such, her goal is to continue with weight loss efforts. She  has agreed to Category 3 Plan.  Exercise: Svea has been instructed that some exercise is better than none for weight loss and overall health benefits.   Behavior Modification:  We discussed the following Behavioral Modification Strategies today: increasing lean protein intake, decreasing simple carbohydrates, increasing vegetables, increase H2O intake, increase high fiber foods, no skipping meals, emotional eating strategies , holiday eating strategies, and planning for success. We discussed various medication options to help Forever with her weight loss  efforts and we both agreed to continue Wegovy 1 mg weekly for medical weight loss.  Return in about 5 weeks (around 03/13/2023).[name] She was informed of the importance of frequent follow up visits to maximize her success with intensive lifestyle modifications for her multiple health conditions.  Attestation Statements:   Reviewed by clinician on day of visit: allergies, medications, problem list, medical history, surgical history, family history, social history, and previous encounter notes.   Time spent on visit including pre-visit chart review and post-visit care and charting was 38 minutes.    Anab Vivar, PA-C

## 2023-02-06 ENCOUNTER — Other Ambulatory Visit: Payer: Self-pay

## 2023-02-06 ENCOUNTER — Encounter (INDEPENDENT_AMBULATORY_CARE_PROVIDER_SITE_OTHER): Payer: Self-pay | Admitting: Physician Assistant

## 2023-02-06 ENCOUNTER — Ambulatory Visit (INDEPENDENT_AMBULATORY_CARE_PROVIDER_SITE_OTHER): Payer: Managed Care, Other (non HMO) | Admitting: Physician Assistant

## 2023-02-06 VITALS — BP 115/80 | HR 77 | Temp 97.8°F | Ht 65.0 in | Wt 206.0 lb

## 2023-02-06 DIAGNOSIS — G4733 Obstructive sleep apnea (adult) (pediatric): Secondary | ICD-10-CM | POA: Diagnosis not present

## 2023-02-06 DIAGNOSIS — R7303 Prediabetes: Secondary | ICD-10-CM

## 2023-02-06 DIAGNOSIS — E559 Vitamin D deficiency, unspecified: Secondary | ICD-10-CM

## 2023-02-06 DIAGNOSIS — F3289 Other specified depressive episodes: Secondary | ICD-10-CM

## 2023-02-06 DIAGNOSIS — F5089 Other specified eating disorder: Secondary | ICD-10-CM

## 2023-02-06 DIAGNOSIS — Z6834 Body mass index (BMI) 34.0-34.9, adult: Secondary | ICD-10-CM

## 2023-02-06 DIAGNOSIS — I10 Essential (primary) hypertension: Secondary | ICD-10-CM

## 2023-02-06 MED ORDER — VITAMIN D (ERGOCALCIFEROL) 1.25 MG (50000 UNIT) PO CAPS
50000.0000 [IU] | ORAL_CAPSULE | ORAL | 0 refills | Status: DC
Start: 2023-02-06 — End: 2023-04-15

## 2023-02-06 MED ORDER — BUPROPION HCL ER (XL) 300 MG PO TB24: 300.0000 mg | ORAL_TABLET | Freq: Every day | ORAL | 0 refills | Status: DC

## 2023-02-06 MED ORDER — WEGOVY 1 MG/0.5ML ~~LOC~~ SOAJ
1.0000 mg | SUBCUTANEOUS | 1 refills | Status: DC
  Filled 2023-02-06: qty 2, 28d supply, fill #0

## 2023-02-20 ENCOUNTER — Encounter: Payer: Self-pay | Admitting: Family Medicine

## 2023-02-21 NOTE — Telephone Encounter (Signed)
Can you put her on my virtual schedule for tomorrow so we can follow up and make adjustments before the holidays?

## 2023-02-22 ENCOUNTER — Telehealth (INDEPENDENT_AMBULATORY_CARE_PROVIDER_SITE_OTHER): Payer: Managed Care, Other (non HMO) | Admitting: Family Medicine

## 2023-02-22 DIAGNOSIS — G4700 Insomnia, unspecified: Secondary | ICD-10-CM | POA: Diagnosis not present

## 2023-02-22 DIAGNOSIS — I10 Essential (primary) hypertension: Secondary | ICD-10-CM | POA: Diagnosis not present

## 2023-02-22 MED ORDER — LOSARTAN POTASSIUM-HCTZ 100-25 MG PO TABS: 0.5000 | ORAL_TABLET | Freq: Every day | ORAL | Status: DC

## 2023-02-22 MED ORDER — BUPROPION HCL ER (XL) 150 MG PO TB24: 150.0000 mg | ORAL_TABLET | Freq: Every day | ORAL | 0 refills | Status: DC

## 2023-02-22 NOTE — Progress Notes (Signed)
MyChart Video Visit    Virtual Visit via Video Note   This format is felt to be most appropriate for this patient at this time. Physical exam was limited by quality of the video and audio technology used for the visit.    Patient location: home Provider location: [facility] Persons involved in the visit: patient, provider  I discussed the limitations of evaluation and management by telemedicine and the availability of in person appointments. The patient expressed understanding and agreed to proceed.  [name]   DOB: August 27, 1965   57 y.o. Female  MRN: [id number] Visit Date: 02/22/2023  Today's healthcare provider: Shirlee Latch, MD   No chief complaint on file.  Subjective    HPI   Discussed the use of AI scribe software for clinical note transcription with the patient, who gave verbal consent to proceed.  History of Present Illness   The patient, with a history of hypertension and insomnia, presents with concerns about their blood pressure medication. They report that they stopped taking their blood pressure medication last week due to a drop in their blood pressure. They have been monitoring their blood pressure at home, and it was 138/78 at the time of the consultation. The patient has also been experiencing insomnia, which they believe may be related to their Wellbutrin medication. They report that they have not been sleeping well and that their sleep improved when they stopped taking all their medications.  The patient has been on a weight loss journey with the help of Wegovy. They report a weight loss of 43-44 pounds but have plateaued for the past two months. Despite the weight loss, the patient's blood pressure remains a concern.         Review of Systems      Objective    There were no vitals taken for this visit.      Physical Exam Constitutional:      General: She is not in acute distress.    Appearance: Normal  appearance.  HENT:     Head: Normocephalic.  Pulmonary:     Effort: Pulmonary effort is normal. No respiratory distress.  Neurological:     Mental Status: She is alert and oriented to person, place, and time. Mental status is at baseline.        Assessment & Plan     Problem List Items Addressed This Visit       Cardiovascular and Mediastinum   Essential hypertension   Blood pressure has decreased significantly, likely due to weight loss from [facility]. Current reading is 138/78 off all antihypertensive medications. Requires a reduced dose of losartan HCTZ. Discussed option of cutting current losartan HCTZ tablets in half to achieve desired dose, which is cost-effective and avoids medication waste. Emphasized importance of monitoring blood pressure regularly and staying hydrated. - Cut losartan HCTZ 100 mg/25 mg tablets in half to achieve a 50 mg/12.5 mg dose - Monitor blood pressure regularly - Stay hydrated      Relevant Medications   losartan-hydrochlorothiazide (HYZAAR) 100-25 MG tablet     Other   Morbid obesity with starting BMI 44 - Primary   Insomnia        Insomnia Insomnia likely secondary to Wellbutrin. Reports improved sleep after stopping Wellbutrin. Insomnia is a known side effect of Wellbutrin. Discussed option of reducing Wellbutrin dose to mitigate insomnia while maintaining its benefits for appetite suppression. - Prescribe Wellbutrin 150 mg extended-release tablets to be taken once daily in the  morning - Send prescription to [facility]  Weight Management Lost 43-44 pounds on Wegovy but plateaued at 208 lbs for past two months.  Current dose will be maintained until next scheduled increase. - Continue current dose of Wegovy - Consider increase Wegovy dose to 1.7 mg next month as planned by other office.       Meds ordered this encounter  Medications   buPROPion (WELLBUTRIN XL) 150 MG 24 hr tablet    Sig: Take 1 tablet (150 mg total) by mouth daily.     Dispense:  30 tablet    Refill:  0   losartan-hydrochlorothiazide (HYZAAR) 100-25 MG tablet    Sig: Take 0.5 tablets by mouth daily.     No follow-ups on file.     I discussed the assessment and treatment plan with the patient. The patient was provided an opportunity to ask questions and all were answered. The patient agreed with the plan and demonstrated an understanding of the instructions.   The patient was advised to call back or seek an in-person evaluation if the symptoms worsen or if the condition fails to improve as anticipated.   Shirlee Latch, MD [facility] [phone] (phone) [phone] (fax)  [facility]

## 2023-02-22 NOTE — Assessment & Plan Note (Signed)
Blood pressure has decreased significantly, likely due to weight loss from [facility]. Current reading is 138/78 off all antihypertensive medications. Requires a reduced dose of losartan HCTZ. Discussed option of cutting current losartan HCTZ tablets in half to achieve desired dose, which is cost-effective and avoids medication waste. Emphasized importance of monitoring blood pressure regularly and staying hydrated. - Cut losartan HCTZ 100 mg/25 mg tablets in half to achieve a 50 mg/12.5 mg dose - Monitor blood pressure regularly - Stay hydrated

## 2023-03-02 ENCOUNTER — Other Ambulatory Visit: Payer: Self-pay | Admitting: Family Medicine

## 2023-03-02 DIAGNOSIS — K219 Gastro-esophageal reflux disease without esophagitis: Secondary | ICD-10-CM

## 2023-03-07 ENCOUNTER — Ambulatory Visit
Admission: EM | Admit: 2023-03-07 | Discharge: 2023-03-07 | Disposition: A | Discharge status: Home / Self Care | Payer: Managed Care, Other (non HMO) | Attending: Emergency Medicine | Admitting: Emergency Medicine

## 2023-03-07 DIAGNOSIS — J069 Acute upper respiratory infection, unspecified: Secondary | ICD-10-CM

## 2023-03-07 DIAGNOSIS — H1032 Unspecified acute conjunctivitis, left eye: Secondary | ICD-10-CM

## 2023-03-07 MED ORDER — PREDNISONE 10 MG (21) PO TBPK: ORAL_TABLET | Freq: Every day | ORAL | 0 refills | Status: DC

## 2023-03-07 MED ORDER — PROMETHAZINE-DM 6.25-15 MG/5ML PO SYRP: 5.0000 mL | ORAL_SOLUTION | Freq: Four times a day (QID) | ORAL | 0 refills | Status: DC | PRN

## 2023-03-07 MED ORDER — POLYMYXIN B-TRIMETHOPRIM 10000-0.1 UNIT/ML-% OP SOLN: 1.0000 [drp] | Freq: Four times a day (QID) | OPHTHALMIC | 0 refills | Status: AC

## 2023-03-07 NOTE — ED Provider Notes (Signed)
 Mackenzie Newman    CSN: [id number] Arrival date & time: 03/07/23  0801      History   Chief Complaint Chief Complaint  Patient presents with   URI    HPI Mackenzie Newman is a 58 y.o. female.  Patient presents with congestion and cough x 1 week.  Her symptoms are improving and her mucus has become clear.  She developed left eye redness, itching, drainage yesterday.  She has been treating her cough with codeine  cough syrup that was leftover from previous illness.  She reports the codeine  cough syrup only has 1 dose left and she would like a refill.  She denies fever, chest pain, shortness of breath, eye pain, change in vision.    The history is provided by the patient and medical records.    Past Medical History:  Diagnosis Date   High blood pressure    High cholesterol    Obesity    Pre-diabetes     Patient Active Problem List   Diagnosis Date Noted   BMI 34.0-34.9,adult Current BMI 34.7 12/06/2022   Depression 11/06/2022   Elevated serum creatinine 11/06/2022   Fatigue 11/06/2022   Stage 3a chronic kidney disease (HCC) 09/11/2022   Elevated liver enzymes 06/07/2022   Pre-diabetes 03/26/2022   Polyphagia 02/20/2022   OSA on CPAP 11/20/2021   Vitamin D  deficiency 10/30/2021   Insulin  resistance 10/30/2021   Essential hypertension 10/30/2021   Attention and concentration deficit 09/19/2020   Special screening for malignant neoplasms, colon    Melanosis of colon    External hemorrhoids    Internal hemorrhoids    Diverticulosis of large intestine without diverticulitis    Hyperlipidemia 05/02/2017   Insomnia 10/17/2016   Verruca vulgaris 10/17/2016   Acid reflux 08/28/2014   Morbid obesity with starting BMI 44 08/28/2014    Past Surgical History:  Procedure Laterality Date   CESAREAN SECTION     COLONOSCOPY WITH PROPOFOL  N/A 08/02/2017   Procedure: COLONOSCOPY WITH PROPOFOL ;  Surgeon: Janalyn Keene NOVAK, MD;  Location: [facility] ENDOSCOPY;  Service: Endoscopy;   Laterality: N/A;   MOHS SURGERY      OB History     Gravida  1   Para  1   Term  1   Preterm      AB      Living  1      SAB      IAB      Ectopic      Multiple      Live Births  1            Home Medications    Prior to Admission medications   Medication Sig Start Date End Date Taking? Authorizing Provider  predniSONE  (STERAPRED UNI-PAK 21 TAB) 10 MG (21) TBPK tablet Take by mouth daily. As directed 03/07/23  Yes Corlis Burnard DEL, NP  promethazine -dextromethorphan (PROMETHAZINE -DM) 6.25-15 MG/5ML syrup Take 5 mLs by mouth 4 (four) times daily as needed. 03/07/23  Yes Corlis Burnard DEL, NP  trimethoprim -polymyxin b  (POLYTRIM ) ophthalmic solution Place 1 drop into both eyes 4 (four) times daily for 7 days. 03/07/23 03/14/23 Yes Corlis Burnard DEL, NP  buPROPion  (WELLBUTRIN  XL) 150 MG 24 hr tablet Take 1 tablet (150 mg total) by mouth daily. 02/22/23   Bacigalupo, Angela M, MD  clotrimazole -betamethasone  (LOTRISONE ) cream Apply 1 application topically 2 (two) times daily. 03/27/19   Bacigalupo, Angela M, MD  fluorouracil (EFUDEX) 5 % cream Apply 1 Application topically 2 (two) times daily. 08/03/21  [provider]  losartan -hydrochlorothiazide  (HYZAAR) 100-25 MG tablet Take 0.5 tablets by mouth daily. 02/22/23   Myrla Jon HERO, MD  metFORMIN  (GLUCOPHAGE ) 500 MG tablet Take 1 tablet (500 mg total) by mouth daily. 01/16/23 04/16/23  Rayburn, Elouise Phlegm, PA-C  omeprazole  (PRILOSEC) 20 MG capsule TAKE 1 CAPSULE(20 MG) BY MOUTH DAILY 03/05/23   Myrla Jon HERO, MD  Semaglutide -Weight Management (WEGOVY ) 1 MG/0.5ML SOAJ Inject 1 mg into the skin once a week. 02/06/23   Rayburn, Elouise Phlegm, PA-C  Vitamin D , Ergocalciferol , (DRISDOL ) 1.25 MG (50000 UNIT) CAPS capsule Take 1 capsule (50,000 Units total) by mouth every 14 (fourteen) days. 02/06/23   Rayburn, Elouise Phlegm, PA-C  zolpidem  (AMBIEN ) 10 MG tablet Take 1 tablet (10 mg total) by mouth at bedtime as  needed. for sleep 06/29/22   Myrla Jon HERO, MD    Family History Family History  Adopted: Yes  Problem Relation Age of Onset   Breast cancer Neg Hx     Social History Social History   Tobacco Use   Smoking status: Never   Smokeless tobacco: Never  Vaping Use   Vaping status: Never Used  Substance Use Topics   Alcohol use: No   Drug use: No     Allergies   Patient has no known allergies.   Review of Systems Review of Systems  Constitutional:  Negative for chills and fever.  HENT:  Positive for congestion and postnasal drip. Negative for ear pain and sore throat.   Eyes:  Positive for discharge, redness and itching. Negative for pain and visual disturbance.  Respiratory:  Positive for cough. Negative for shortness of breath.      Physical Exam Triage Vital Signs ED Triage Vitals  Encounter Vitals Group     BP      Systolic BP Percentile      Diastolic BP Percentile      Pulse      Resp      Temp      Temp src      SpO2      Weight      Height      Head Circumference      Peak Flow      Pain Score      Pain Loc      Pain Education      Exclude from Growth Chart    No data found.  Updated Vital Signs BP 119/73   Pulse 71   Temp 97.9 F (36.6 C)   Resp 18   SpO2 98%   Visual Acuity Right Eye Distance:   Left Eye Distance:   Bilateral Distance:    Right Eye Near:   Left Eye Near:    Bilateral Near:     Physical Exam Constitutional:      General: She is not in acute distress. HENT:     Right Ear: Tympanic membrane normal.     Left Ear: Tympanic membrane normal.     Nose: Rhinorrhea present.     Mouth/Throat:     Mouth: Mucous membranes are moist.     Pharynx: Oropharynx is clear.     Comments: Clear PND. Eyes:     General: Lids are normal. Vision grossly intact.        Right eye: No discharge.        Left eye: No discharge.     Conjunctiva/sclera:     Left eye: Left conjunctiva is injected.     Pupils: Pupils  are equal,  round, and reactive to light.  Cardiovascular:     Rate and Rhythm: Normal rate and regular rhythm.     Heart sounds: Normal heart sounds.  Pulmonary:     Effort: Pulmonary effort is normal. No respiratory distress.     Breath sounds: Normal breath sounds.  Neurological:     Mental Status: She is alert.      UC Treatments / Results  Labs (all labs ordered are listed, but only abnormal results are displayed) Labs Reviewed - No data to display  EKG   Radiology No results found.  Procedures Procedures (including critical care time)  Medications Ordered in UC Medications - No data to display  Initial Impression / Assessment and Plan / UC Course  I have reviewed the triage vital signs and the nursing notes.  Pertinent labs & imaging results that were available during my care of the patient were reviewed by me and considered in my medical decision making (see chart for details).    Bacterial conjunctivitis of left eye, viral URI with cough.  Treating eye infection with Polytrim  eyedrops.  Education provided on bacterial conjunctivitis.  Patient's URI symptoms are improving.  She has residual cough and postnasal drip.  Treating today with prednisone  taper and Promethazine  DM.  Precautions for drowsiness with promethazine  discussed.  Education provided on viral respiratory infection.  Instructed patient to follow up with her PCP if her symptoms are not improving.  She agrees to plan of care.   Final Clinical Impressions(s) / UC Diagnoses   Final diagnoses:  Acute bacterial conjunctivitis of left eye  Viral URI with cough     Discharge Instructions      Use the eye drops as directed.    Take the prednisone  as directed.    Take the promethazine  DM as directed.  Do not drive, operate machinery, drink alcohol, or perform dangerous activities while taking this medication as it may cause drowsiness.  Follow-up with your primary care provider if your symptoms are not  improving.      ED Prescriptions     Medication Sig Dispense Auth. Provider   trimethoprim -polymyxin b  (POLYTRIM ) ophthalmic solution Place 1 drop into both eyes 4 (four) times daily for 7 days. 10 mL Corlis Burnard DEL, NP   promethazine -dextromethorphan (PROMETHAZINE -DM) 6.25-15 MG/5ML syrup Take 5 mLs by mouth 4 (four) times daily as needed. 118 mL Corlis Burnard DEL, NP   predniSONE  (STERAPRED UNI-PAK 21 TAB) 10 MG (21) TBPK tablet Take by mouth daily. As directed 21 tablet Corlis Burnard DEL, NP      PDMP not reviewed this encounter.   Corlis Burnard DEL, NP 03/07/23 0900

## 2023-03-07 NOTE — ED Triage Notes (Signed)
 Patient to Urgent Care with complaints of nasal congestion/ cough- productive w/ green mucus, left eye itching.  Symptoms started 6-7 days ago. Reports yesterday afternoon started developing left sided eye itching. Woke up this morning with some crusting, itching, pink. Granddaughter sick with same symptoms.   Taking prescribed cough syrup from pcp.

## 2023-03-07 NOTE — Discharge Instructions (Addendum)
 Use the eye drops as directed.    Take the prednisone  as directed.    Take the promethazine  DM as directed.  Do not drive, operate machinery, drink alcohol, or perform dangerous activities while taking this medication as it may cause drowsiness.  Follow-up with your primary care provider if your symptoms are not improving.

## 2023-03-10 NOTE — Progress Notes (Deleted)
   SUBJECTIVE:  Chief Complaint: Obesity  Interim History: ***  Che is here to discuss her progress with her obesity treatment plan. She is on the {HWW Weight Loss Plan:210964005} and states she {CHL AMB IS/IS NOT:210130109} following her eating plan approximately *** % of the time. She states she {CHL AMB IS/IS NOT:210130109} exercising *** minutes *** times per week.   OBJECTIVE: Visit Diagnoses: Problem List Items Addressed This Visit     Morbid obesity with starting BMI 44   Vitamin D  deficiency   Essential hypertension   Pre-diabetes - Primary   Depression    No data recorded No data recorded No data recorded No data recorded   ASSESSMENT AND PLAN:  Diet: Winston {CHL AMB IS/IS NOT:210130109} currently in the action stage of change. As such, her goal is to {HWW Weight Loss Efforts:210964006}. She {HAS HAS WNU:81165} agreed to {HWW Weight Loss Plan:210964005}.  Exercise: Ofelia has been instructed {HWW Exercise:210964007} for weight loss and overall health benefits.   Behavior Modification:  We discussed the following Behavioral Modification Strategies today: {HWW Behavior Modification:210964008}. We discussed various medication options to help Shaneal with her weight loss efforts and we both agreed to ***.  No follow-ups on file.Mackenzie Newman She was informed of the importance of frequent follow up visits to maximize her success with intensive lifestyle modifications for her multiple health conditions.  Attestation Statements:   Reviewed by clinician on day of visit: allergies, medications, problem list, medical history, surgical history, family history, social history, and previous encounter notes.   Time spent on visit including pre-visit chart review and post-visit care and charting was *** minutes.    Sondos Wolfman, PA-C

## 2023-03-11 ENCOUNTER — Other Ambulatory Visit: Payer: Self-pay | Admitting: Family Medicine

## 2023-03-11 ENCOUNTER — Encounter (INDEPENDENT_AMBULATORY_CARE_PROVIDER_SITE_OTHER): Payer: Self-pay

## 2023-03-11 ENCOUNTER — Ambulatory Visit (INDEPENDENT_AMBULATORY_CARE_PROVIDER_SITE_OTHER): Payer: Managed Care, Other (non HMO) | Admitting: Physician Assistant

## 2023-03-11 DIAGNOSIS — R7303 Prediabetes: Secondary | ICD-10-CM

## 2023-03-11 DIAGNOSIS — E559 Vitamin D deficiency, unspecified: Secondary | ICD-10-CM

## 2023-03-11 DIAGNOSIS — F3289 Other specified depressive episodes: Secondary | ICD-10-CM

## 2023-03-11 DIAGNOSIS — I10 Essential (primary) hypertension: Secondary | ICD-10-CM

## 2023-03-11 DIAGNOSIS — Z1231 Encounter for screening mammogram for malignant neoplasm of breast: Secondary | ICD-10-CM

## 2023-03-12 ENCOUNTER — Encounter (INDEPENDENT_AMBULATORY_CARE_PROVIDER_SITE_OTHER): Payer: Self-pay | Admitting: Physician Assistant

## 2023-03-12 ENCOUNTER — Other Ambulatory Visit (INDEPENDENT_AMBULATORY_CARE_PROVIDER_SITE_OTHER): Payer: Self-pay | Admitting: Physician Assistant

## 2023-03-12 ENCOUNTER — Other Ambulatory Visit: Payer: Self-pay

## 2023-03-12 MED ORDER — WEGOVY 1 MG/0.5ML ~~LOC~~ SOAJ: 1.0000 mg | SUBCUTANEOUS | 1 refills | Status: DC

## 2023-03-12 MED ORDER — WEGOVY 1 MG/0.5ML ~~LOC~~ SOAJ
1.0000 mg | SUBCUTANEOUS | 1 refills | Status: DC
  Filled 2023-03-12: qty 2, 28d supply, fill #0

## 2023-03-14 ENCOUNTER — Other Ambulatory Visit: Payer: Self-pay

## 2023-03-18 ENCOUNTER — Encounter (INDEPENDENT_AMBULATORY_CARE_PROVIDER_SITE_OTHER): Payer: Self-pay | Admitting: Family Medicine

## 2023-03-18 ENCOUNTER — Ambulatory Visit (INDEPENDENT_AMBULATORY_CARE_PROVIDER_SITE_OTHER): Payer: Managed Care, Other (non HMO) | Admitting: Family Medicine

## 2023-03-18 VITALS — BP 124/71 | HR 72 | Temp 98.2°F | Ht 65.0 in | Wt 205.0 lb

## 2023-03-18 DIAGNOSIS — E669 Obesity, unspecified: Secondary | ICD-10-CM

## 2023-03-18 DIAGNOSIS — E559 Vitamin D deficiency, unspecified: Secondary | ICD-10-CM

## 2023-03-18 DIAGNOSIS — I10 Essential (primary) hypertension: Secondary | ICD-10-CM | POA: Diagnosis not present

## 2023-03-18 DIAGNOSIS — F3289 Other specified depressive episodes: Secondary | ICD-10-CM

## 2023-03-18 DIAGNOSIS — F5089 Other specified eating disorder: Secondary | ICD-10-CM | POA: Diagnosis not present

## 2023-03-18 DIAGNOSIS — E7800 Pure hypercholesterolemia, unspecified: Secondary | ICD-10-CM

## 2023-03-18 DIAGNOSIS — E538 Deficiency of other specified B group vitamins: Secondary | ICD-10-CM

## 2023-03-18 DIAGNOSIS — R7303 Prediabetes: Secondary | ICD-10-CM

## 2023-03-18 DIAGNOSIS — Z6834 Body mass index (BMI) 34.0-34.9, adult: Secondary | ICD-10-CM

## 2023-03-18 MED ORDER — METFORMIN HCL 500 MG PO TABS: 500.0000 mg | ORAL_TABLET | Freq: Every day | ORAL | 0 refills | Status: DC

## 2023-03-18 MED ORDER — BUPROPION HCL ER (XL) 150 MG PO TB24: 150.0000 mg | ORAL_TABLET | Freq: Every day | ORAL | 0 refills | Status: DC

## 2023-03-18 MED ORDER — WEGOVY 1.7 MG/0.75ML ~~LOC~~ SOAJ: 1.7000 mg | SUBCUTANEOUS | 0 refills | Status: DC

## 2023-03-18 NOTE — Progress Notes (Signed)
 Mackenzie Newman, D.O.  ABFM, ABOM Specializing in Clinical Bariatric Medicine  Office located at: [address] [place], [place]  [address]   Assessment and Plan:   Fasting labs obtained today (A1C, fasting insulin , vitamin D , vitamin B12, lipid panel, and CMP) --  will be reviewed at her next OV.   FOR THE DISEASE OF OBESITY: Obesity- START BMI 44 BMI 34.0-34.9,adult Current BMI 34.3 Assessment & Plan: Since last office visit with Mackenzie Brisker, PA on 02/06/23 with patient's  muscle mass has decreased by 2.2lb. Fat mass has increased by 1.6lb. Total body water has increased by 1.8lb.  Counseling done on how various foods will affect these numbers and how to maximize success  Total lbs lost to date: 44 lbs Total weight loss percentage to date: -17.67   Recommended Dietary Goals [name] is currently in the action stage of change. As such, her goal is to continue weight management plan.  She has agreed to: continue current plan   Behavioral Intervention We discussed the following today: increasing lean protein intake to established goals, decreasing simple carbohydrates , avoiding skipping meals, increasing water intake , decreasing eating out or consumption of processed foods, and making healthy choices when eating convenient foods, better snacking choices, and continue to work on maintaining a reduced calorie state, getting the recommended amount of protein, incorporating whole foods, making healthy choices, staying well hydrated and practicing mindfulness when eating.  Additional resources provided today: None  Evidence-based interventions for health behavior change were utilized today including the discussion of self monitoring techniques, problem-solving barriers and SMART goal setting techniques.   Regarding patient's less desirable eating habits and patterns, we employed the technique of small changes.    Recommended Physical Activity Goals [name] has been advised to  work up to 150 minutes of moderate intensity aerobic activity a week and strengthening exercises 2-3 times per week for cardiovascular health, weight loss maintenance and preservation of muscle mass.   She has agreed to :  Increase physical activity in their day and reduce sedentary time (increase NEAT).   Pharmacotherapy We discussed various medication options to help [name] with her weight loss efforts and we both agreed to : continue with nutritional and behavioral strategies, continue current anti-obesity medication regimen, and increase Wegovy  to 1.7 mg after finishing her last 3 1 mg doses.  Pt reports starting Wegovy  for medical weight loss in March 2024 and has been on the 1 mg dose for about 6 months or so. Reports increase cravings for sweets. She has 3 doses of 1 mg left. Also reports needing a prior authorization for Wegovy  for her insurance.   I recommend she finish her doses of Wegovy  1 mg before increasing to 1.7 mg once weekly for better management of hunger and cravings. Discussed the possibility of increasing dose frequency to BID to better manage hunger/cravings if needed in the future.    FOR ASSOCIATED CONDITIONS ADDRESSED TODAY: Emotional eating/cravings Assessment & Plan: Hunger and cravings are uncontrolled, stating her sweet tooth has come back last week. Pt previously was taking Wellbutrin  300 mg XL once daily. Her dose was recently decreased to 150 mg XL once daily due to associated insomnia with higher dose. She started her decreased dose about 2-3 weeks ago and reports improved sleep quality. Tolerating well with no side effects. No changes to moods with decreased dose. Endorses some off plan eating over the holidays.   I recommend she continue on this regimen as directed by  her PCP. No changes made today. Continue to monitor her condition as it relates to her weight loss alongside PCP/specialists.   Orders: - Refill Wellbutrin  XL 150 mg, no dose  changes.   Pre-diabetes Assessment & Plan: Lab Results  Component Value Date   HGBA1C 5.4 03/18/2023   HGBA1C 5.4 11/06/2022   HGBA1C 5.6 08/16/2022   INSULIN  38.2 (H) 03/18/2023   INSULIN  10.0 11/06/2022   INSULIN  23.1 08/16/2022   Last A1C was at goal at 5.4 and insulin  at 10 from last obtained labs on 11/06/22. Pt treating pre-DM with Metformin  500 mg once daily with breakfast. Tolerating well, no reported side effects. Hunger/cravings uncontrolled and endorses food noise. Also taking Wegovy  1 mg once weekly for medical weight loss. Pt denies any decrease in Wegovy  prior to her next injection.   Reviewed how eating off-plan can increased hunger, cravings, and food noise. I advised pt to follow her meal plan and work on increasing exercise. I recommend she take Wegovy  on Thursday evenings/Friday mornings for optimal effectiveness over the weekends. Will increase her Wegovy  to 1.7 mg (see pharmacotherapy section above for more detail). Continue with Metformin  at current dose. Will recheck CMP, fasting insulin , and Hgb A1C today and review at her next follow up visit.   Orders: - Refill Metformin , no dose changes today.  - Increase Wegovy  1.7 once weekly (see details above in pharmacotherapy section) - Recheck CMP - Recheck A1C - Recheck fasting insulin    Essential hypertension Assessment & Plan: BP Readings from Last 3 Encounters:  03/18/23 124/71  03/07/23 119/73  02/06/23 115/80   Per pt her antihypertensives were recently readjusted due to increased fatigue and BP readings as low as 90/40s while on her previous regimen. She previously was taking HCTZ and has now been on Hyzaar for about 6-8 months. About a month ago her Hyzaar dose was decreased to 100-25 mg once daily, which she is currently compliant with. She is tolerating this dose well and reports no adverse side effects. No acute concerns today.   Encouraged pt to follow her heart healthy meal plan and avoid any high sodium  foods. Continue with current antihypertensive treatment as directed by PCP/specialists and we will closely monitor her condition alongside these individuals.    Vitamin D  deficiency Assessment & Plan: Lab Results  Component Value Date   VD25OH 47.7 03/18/2023   VD25OH 59.4 11/06/2022   VD25OH 46.6 08/16/2022   Last vitamin D  was at goal at 59.4 as of 11/06/22. Currently compliant with ERGO 50K units every other week. Tolerating well with no side effects. Reports previously taking ERGO weekly, but was decreased to every other week about a month ago. No concerns regarding this today. Continue with current supplementation regimen. No changes made today. Will recheck vitamin D  levels today and review at her next OV.   Orders: - Recheck vitamin D    Pure hypercholesterolemia Assessment & Plan: Lab Results  Component Value Date   CHOL 225 (H) 12/24/2022   HDL 66 12/24/2022   LDLCALC 141 (H) 12/24/2022   TRIG 100 12/24/2022   CHOLHDL 3.4 12/24/2022   HLD is managed with diet/exercise approach. Has been diagnosed with HLD for some time now, but never been on medications. Reviewed last labs with elevated LDL at 141 as of 12/24/22. Pt has been eating off plan and reports eating foods such as prime rib over the holidays.   Encouraged pt to avoid fatty meats, saturated and trans fast, and follow her heart healthy  meal plan. Increase exercise and daily lean protein intake. Will recheck her lipid panel today and review at her next follow up visit.   Orders: - Recheck lipid panel   B12 deficiency due to diet Assessment & Plan: Lab Results  Component Value Date   VITAMINB12 311 11/06/2022   Taking sublingual B12 complex OTC daily, pt unsure of exact dosing. Tolerating well, no SE. Denies any formal diagnosis of B12 deficiency. NO current concerns today. I recommend she continue with her current regimen. Will recheck B12 today and review the results at her next OV.   Orders: - Recheck vitamin  B12   Follow up:   Return in about 4 weeks (around 04/15/2023). She was informed of the importance of frequent follow up visits to maximize her success with intensive lifestyle modifications for her multiple health conditions.  Subjective:   Chief complaint: Obesity Jansen is here to discuss her progress with her obesity treatment plan. She is on the Category 3 Plan and states she is following her eating plan approximately 50% of the time. She states she is not exercising.  Interval History:  Quiana Cobaugh is here for a follow up office visit and is new to me. Since last OV, she is down 1 lb. Some off-plan eating over the holidays, but has tried to practice more mindful eating. Reports wanting to eat prime rib which she had not had in a year or so.    Barriers identified: strong hunger signals and impaired satiety / inhibitory control, exposure to enticing environments and/or relationships, and inadequate sleep.   Pharmacotherapy for weight loss: She is currently taking Metformin  (off label use for incretin effect and / or insulin  resistance and / or diabetes prevention) with adequate clinical response  and without side effects., Wegovy  with adequate clinical response  and without side effects., and Bupropion  (single agent, off label use) with adequate clinical response  and without side effects..   Review of Systems:  Pertinent positives were addressed with patient today.  Reviewed by clinician on day of visit: allergies, medications, problem list, medical history, surgical history, family history, social history, and previous encounter notes.  Weight Summary and Biometrics   Weight Lost Since Last Visit: 1 lb  Weight Gained Since Last Visit: 0 lb    Vitals Temp: 98.2 F (36.8 C) BP: 124/71 Pulse Rate: 72 SpO2: 100 %   Anthropometric Measurements Height: 5' 5 (1.651 m) Weight: 205 lb (93 kg) BMI (Calculated): 34.11 Weight at Last Visit: 206 lb Weight Lost Since Last  Visit: 1 lb Weight Gained Since Last Visit: 0 lb Starting Weight: 249 lb Total Weight Loss (lbs): 44 lb (20 kg) Peak Weight: 249 lb   Body Composition  Body Fat %: 43.8 % Fat Mass (lbs): 90 lbs Muscle Mass (lbs): 109.6 lbs Total Body Water (lbs): 79 lbs Visceral Fat Rating : 12   Other Clinical Data Fasting: Yes Labs: Yes Today's Visit #: 18 Starting Date: 10/17/22    Objective:   PHYSICAL EXAM: Blood pressure 124/71, pulse 72, temperature 98.2 F (36.8 C), height 5' 5 (1.651 m), weight 205 lb (93 kg), SpO2 100%. Body mass index is 34.11 kg/m.  General: she is overweight, cooperative and in no acute distress. PSYCH: Has normal mood, affect and thought process.   HEENT: EOMI, sclerae are anicteric. Lungs: Normal breathing effort, no conversational dyspnea. Extremities: Moves * 4 Neurologic: A and O * 3, good insight  DIAGNOSTIC DATA REVIEWED: BMET    Component Value Date/Time  NA 141 12/24/2022 0938   K 4.2 12/24/2022 0938   CL 102 12/24/2022 0938   CO2 24 12/24/2022 0938   GLUCOSE 92 12/24/2022 0938   BUN 18 12/24/2022 0938   CREATININE 1.24 (H) 12/24/2022 0938   CALCIUM 10.3 (H) 12/24/2022 0938   GFRNONAA 54 (L) 01/06/2020 0759   GFRAA 63 01/06/2020 0759   Lab Results  Component Value Date   HGBA1C 5.4 11/06/2022   HGBA1C 5.6 06/23/2019   Lab Results  Component Value Date   INSULIN  10.0 11/06/2022   INSULIN  25.2 (H) 10/16/2021   Lab Results  Component Value Date   TSH 3.070 10/16/2021   CBC    Component Value Date/Time   WBC 8.0 10/16/2021 0828   RBC 4.68 10/16/2021 0828   HGB 13.1 10/16/2021 0828   HCT 40.1 10/16/2021 0828   PLT 285 10/16/2021 0828   MCV 86 10/16/2021 0828   MCH 28.0 10/16/2021 0828   MCHC 32.7 10/16/2021 0828   RDW 12.9 10/16/2021 0828   Iron Studies No results found for: IRON, TIBC, FERRITIN, IRONPCTSAT Lipid Panel     Component Value Date/Time   CHOL 225 (H) 12/24/2022 0938   TRIG 100 12/24/2022 0938    HDL 66 12/24/2022 0938   CHOLHDL 3.4 12/24/2022 0938   LDLCALC 141 (H) 12/24/2022 0938   Hepatic Function Panel     Component Value Date/Time   PROT 7.3 12/24/2022 0938   ALBUMIN 4.3 12/24/2022 0938   AST 19 12/24/2022 0938   ALT 15 12/24/2022 0938   ALKPHOS 87 12/24/2022 0938   BILITOT 0.4 12/24/2022 0938      Component Value Date/Time   TSH 3.070 10/16/2021 0828   Nutritional Lab Results  Component Value Date   VD25OH 59.4 11/06/2022   VD25OH 46.6 08/16/2022   VD25OH 42.6 04/30/2022    Attestations:   I, Vernell Forest, acting as a stage manager for Mackenzie Jenkins, DO., have compiled all relevant documentation for today's office visit on behalf of Mackenzie Jenkins, DO, while in the presence of [employer], DO.  Reviewed by clinician on day of visit: allergies, medications, problem list, medical history, surgical history, family history, social history, and previous encounter notes pertinent to patient's obesity diagnosis.  I have reviewed the above documentation for accuracy and completeness, and I agree with the above. Mackenzie JINNY Newman, D.O.  The 21st Century Cures Act was signed into law in 2016 which includes the topic of electronic health records.  This provides immediate access to information in MyChart.  This includes consultation notes, operative notes, office notes, lab results and pathology reports.  If you have any questions about what you read please let us  know at your next visit so we can discuss your concerns and take corrective action if need be.  We are right here with you.

## 2023-03-20 LAB — COMPREHENSIVE METABOLIC PANEL
ALT: 11 [IU]/L (ref 0–32)
AST: 12 [IU]/L (ref 0–40)
Albumin: 4.2 g/dL (ref 3.8–4.9)
Alkaline Phosphatase: 102 [IU]/L (ref 44–121)
BUN/Creatinine Ratio: 20 (ref 9–23)
BUN: 20 mg/dL (ref 6–24)
Bilirubin Total: 0.3 mg/dL (ref 0.0–1.2)
CO2: 20 mmol/L (ref 20–29)
Calcium: 10.3 mg/dL — ABNORMAL HIGH (ref 8.7–10.2)
Chloride: 104 mmol/L (ref 96–106)
Creatinine, Ser: 0.99 mg/dL (ref 0.57–1.00)
Globulin, Total: 2.9 g/dL (ref 1.5–4.5)
Glucose: 81 mg/dL (ref 70–99)
Potassium: 4.4 mmol/L (ref 3.5–5.2)
Sodium: 143 mmol/L (ref 134–144)
Total Protein: 7.1 g/dL (ref 6.0–8.5)
eGFR: 67 mL/min/{1.73_m2} (ref >59)

## 2023-03-20 LAB — HEMOGLOBIN A1C
Est. average glucose Bld gHb Est-mCnc: 108 mg/dL
Hgb A1c MFr Bld: 5.4 % (ref 4.8–5.6)

## 2023-03-20 LAB — LIPID PANEL
Chol/HDL Ratio: 3.4 {ratio} (ref 0.0–4.4)
Cholesterol, Total: 233 mg/dL — ABNORMAL HIGH (ref 100–199)
HDL: 69 mg/dL (ref >39)
LDL Chol Calc (NIH): 148 mg/dL — ABNORMAL HIGH (ref 0–99)
Triglycerides: 92 mg/dL (ref 0–149)
VLDL Cholesterol Cal: 16 mg/dL (ref 5–40)

## 2023-03-20 LAB — VITAMIN B12: Vitamin B-12: 1134 pg/mL (ref 232–1245)

## 2023-03-20 LAB — VITAMIN D 25 HYDROXY (VIT D DEFICIENCY, FRACTURES): Vit D, 25-Hydroxy: 47.7 ng/mL (ref 30.0–100.0)

## 2023-03-20 LAB — INSULIN, RANDOM: INSULIN: 38.2 u[IU]/mL — ABNORMAL HIGH (ref 2.6–24.9)

## 2023-03-22 ENCOUNTER — Ambulatory Visit
Admission: RE | Admit: 2023-03-22 | Discharge: 2023-03-22 | Disposition: A | Discharge status: Home / Self Care | Payer: Managed Care, Other (non HMO) | Source: Ambulatory Visit | Attending: Family Medicine | Admitting: Family Medicine

## 2023-03-22 DIAGNOSIS — Z1231 Encounter for screening mammogram for malignant neoplasm of breast: Secondary | ICD-10-CM | POA: Insufficient documentation

## 2023-03-25 ENCOUNTER — Encounter: Payer: Self-pay | Admitting: Family Medicine

## 2023-04-15 ENCOUNTER — Encounter (INDEPENDENT_AMBULATORY_CARE_PROVIDER_SITE_OTHER): Payer: Self-pay | Admitting: Physician Assistant

## 2023-04-15 ENCOUNTER — Ambulatory Visit (INDEPENDENT_AMBULATORY_CARE_PROVIDER_SITE_OTHER): Payer: Managed Care, Other (non HMO) | Admitting: Physician Assistant

## 2023-04-15 VITALS — BP 114/68 | HR 69 | Temp 98.8°F | Ht 65.0 in | Wt 207.0 lb

## 2023-04-15 DIAGNOSIS — R7303 Prediabetes: Secondary | ICD-10-CM | POA: Diagnosis not present

## 2023-04-15 DIAGNOSIS — I10 Essential (primary) hypertension: Secondary | ICD-10-CM

## 2023-04-15 DIAGNOSIS — E559 Vitamin D deficiency, unspecified: Secondary | ICD-10-CM

## 2023-04-15 DIAGNOSIS — Z6834 Body mass index (BMI) 34.0-34.9, adult: Secondary | ICD-10-CM

## 2023-04-15 MED ORDER — VITAMIN D (ERGOCALCIFEROL) 1.25 MG (50000 UNIT) PO CAPS: 50000.0000 [IU] | ORAL_CAPSULE | ORAL | 0 refills | Status: DC

## 2023-04-15 MED ORDER — WEGOVY 1.7 MG/0.75ML ~~LOC~~ SOAJ: 1.7000 mg | SUBCUTANEOUS | 0 refills | Status: DC

## 2023-04-15 NOTE — Progress Notes (Signed)
 SUBJECTIVE: Discussed the use of AI scribe software for clinical note transcription with the patient, who gave verbal consent to proceed.  Chief Complaint: Obesity  Interim History: She is up 2 lbs since last visit Down 42 lbs overall TBW loss of 16.9%  Mackenzie Newman is here to discuss her progress with her obesity treatment plan. She is on the Category 3 Plan and states she is following her eating plan approximately 40 % of the time. She states she is not exercising 0 minutes 0 times per week.  Mackenzie Newman is a 58 year old female with obesity who presents for follow-up of her obesity treatment plan.  She has experienced a recent weight gain of over two pounds, attributing part of it to muscle gain. Despite this, she is pleased with her overall weight loss of 42 pounds since starting her treatment plan in March 2023.  She has been experiencing increased stress over the past two to three weeks, which she associates with stress eating. She acknowledges a pattern of emotional eating, particularly with cravings for chocolate-covered pretzels, ice cream, and caramel corn. She has been on a course of prednisone  a couple of months ago for a sinus infection, which may have contributed to her recent weight gain.  Her current medications include metformin , vitamin D , and bupropion  (Wellbutrin ), with recent refills confirmed. She has been on a 1.7 mg dose of Wegovy  since the past weekend, which she reports is helping with 'food noise'.  She has been experiencing stress due to her husband's medical condition, as he is undergoing treatment for complex regional pain syndrome, which has added to her emotional burden. She has not been engaging in regular exercise due to cold weather and her busy schedule, but she is considering indoor walking videos as a potential activity. She enjoys quilting and recently made baby doll diapers for her granddaughter, which she finds fulfilling. OBJECTIVE: Visit  Diagnoses: Problem List Items Addressed This Visit     Morbid obesity with starting BMI 44   Relevant Medications   Semaglutide -Weight Management (WEGOVY ) 1.7 MG/0.75ML SOAJ   Vitamin D  deficiency   Relevant Medications   Vitamin D , Ergocalciferol , (DRISDOL ) 1.25 MG (50000 UNIT) CAPS capsule   Essential hypertension   Pre-diabetes - Primary   BMI 34.0-34.9,adult Current BMI 34.7  Obesity 58 year old female with obesity, currently on Wegovy . Experienced slight weight gain due to stress and prednisone  for sinus infection. Maintained 42-pound weight loss since starting treatment. Increased Wegovy  dose to 1.7 mg, helping with food cravings. Reports stress eating due to husband's medical condition. Discussed non-food-based stress relief activities and indoor walking videos for physical activity. - Continue Wegovy  1.7 mg - Refill Wegovy  prescription - Encourage non-food-based stress relief activities such as quilting - Recommend indoor walking videos (e.g., Walking with [place]ney [employer]ort) for physical activity  Emotional Eating Reports emotional eating and stress snacking on chocolate-covered pretzels, ice cream, and caramel corn. Identified behavior and working on managing it. Discussed importance of having healthy snacks available and avoiding tempting snacks at home. On Wellbutrin  without side effects.  Continue Wellbutrin   - Encourage non-food-based stress relief activities - Encourage having healthy snacks available - Avoid keeping tempting snacks at home  Prediabetes Prediabetes, managed with metformin . 500 mg daily.  Recently picked up metformin  prescription. No GI or other side effects with metformin .  Lab Results  Component Value Date   HGBA1C 5.4 03/18/2023   HGBA1C 5.4 11/06/2022   HGBA1C 5.6 08/16/2022   Lab Results  Component Value Date  LDLCALC 148 (H) 03/18/2023   CREATININE 0.99 03/18/2023    - Continue metformin  as prescribed Continue working on nutrition plan to decrease  simple carbohydrates, increase lean proteins and exercise to promote weight loss, improve glycemic control and prevent progression to Type 2 diabetes.   Vitamin D  Deficiency Vitamin D  is not at goal of 50.  Most recent vitamin D  level was 47.7. She is on  prescription ergocalciferol  50,000 IU every 14 days. No N/V or muscle weakness with vitamin D [place] [place].  Lab Results  Component Value Date   VD25OH 47.7 03/18/2023   VD25OH 59.4 11/06/2022   VD25OH 46.6 08/16/2022    Plan: Continue and refill  prescription ergocalciferol  50,000 IU every 14 days Low vitamin D  levels can be associated with adiposity and may result in leptin resistance and weight gain. Also associated with fatigue.  Currently on vitamin D  supplementation without any adverse effects such as nausea, vomiting or muscle weakness.    Hypercholesterolemia Hypercholesterolemia. No specific discussion or changes in management noted during this visit.  Hypertension Hypertension. No specific discussion or changes in management noted during this visit.  General Health Maintenance Maintaining health well with significant weight loss and management of chronic conditions. Encouraged to continue current health maintenance practices. - Encourage regular physical activity - Encourage stress management techniques  Follow-up - Follow-up appointment on May 13, 2023, at 7:40 AM with Dr. Carolene Chute - Follow-up appointment on June 17, 2023, at 7:30 AM.  Vitals Temp: 98.8 F (37.1 C) BP: 114/68 Pulse Rate: 69 SpO2: 100 %   Anthropometric Measurements Height: 5\' 5"  (1.651 m) Weight: 207 lb (93.9 kg) BMI (Calculated): 34.45 Weight at Last Visit: 205 lb Weight Lost Since Last Visit: 0 Weight Gained Since Last Visit: 2 b Starting Weight: 249 lb Total Weight Loss (lbs): 42 lb (19.1 kg) Peak Weight: 249 lb   Body Composition  Body Fat %: 44 % Fat Mass (lbs): 91.4 lbs Muscle Mass (lbs): 110.4 lbs Total Body Water (lbs): 80  lbs Visceral Fat Rating : 12   Other Clinical Data Fasting: no Labs: no Today's Visit #: 19 Starting Date: 10/17/22     ASSESSMENT AND PLAN:  Diet: Kayle is currently in the action stage of change. As such, her goal is to continue with weight loss efforts. She has agreed to Category 3 Plan.  Exercise: Callan has been instructed  discussed trying walking with [employer] during colder months   for weight loss and overall health benefits.   Behavior Modification:  We discussed the following Behavioral Modification Strategies today: increasing lean protein intake, decreasing simple carbohydrates, increasing vegetables, increase H2O intake, increase high fiber foods, no skipping meals, meal planning and cooking strategies, avoiding temptations, and planning for success. We discussed various medication options to help Shylah with her weight loss efforts and we both agreed to continue Wegovy  for medical weight loss and wellbutrin  for emotional eating strategies and metformin  for prediabetes .  Return in about 4 weeks (around 05/13/2023).Aaron Aas She was informed of the importance of frequent follow up visits to maximize her success with intensive lifestyle modifications for her multiple health conditions.  Attestation Statements:   Reviewed by clinician on day of visit: allergies, medications, problem list, medical history, surgical history, family history, social history, and previous encounter notes.   Time spent on visit including pre-visit chart review and post-visit care and charting was 33 minutes.    Lyrik Dockstader, PA-C '

## 2023-05-01 ENCOUNTER — Telehealth: Payer: Self-pay | Admitting: Family Medicine

## 2023-05-01 DIAGNOSIS — I10 Essential (primary) hypertension: Secondary | ICD-10-CM

## 2023-05-01 NOTE — Telephone Encounter (Signed)
 [facility] pharmacy is requesting refill losartan-hydrochlorothiazide (HYZAAR) 100-25 MG tablet  Please advise

## 2023-05-02 MED ORDER — LOSARTAN POTASSIUM-HCTZ 100-25 MG PO TABS: 0.5000 | ORAL_TABLET | Freq: Every day | ORAL | 0 refills | Status: DC

## 2023-05-13 ENCOUNTER — Telehealth (INDEPENDENT_AMBULATORY_CARE_PROVIDER_SITE_OTHER): Payer: Self-pay | Admitting: *Deleted

## 2023-05-13 ENCOUNTER — Encounter (INDEPENDENT_AMBULATORY_CARE_PROVIDER_SITE_OTHER): Payer: Self-pay | Admitting: *Deleted

## 2023-05-13 ENCOUNTER — Encounter (INDEPENDENT_AMBULATORY_CARE_PROVIDER_SITE_OTHER): Payer: Self-pay | Admitting: Family Medicine

## 2023-05-13 ENCOUNTER — Ambulatory Visit (INDEPENDENT_AMBULATORY_CARE_PROVIDER_SITE_OTHER): Payer: Managed Care, Other (non HMO) | Admitting: Family Medicine

## 2023-05-13 VITALS — BP 113/72 | HR 77 | Temp 97.8°F | Ht 65.0 in | Wt 200.0 lb

## 2023-05-13 DIAGNOSIS — E669 Obesity, unspecified: Secondary | ICD-10-CM | POA: Diagnosis not present

## 2023-05-13 DIAGNOSIS — Z6833 Body mass index (BMI) 33.0-33.9, adult: Secondary | ICD-10-CM

## 2023-05-13 DIAGNOSIS — F5089 Other specified eating disorder: Secondary | ICD-10-CM

## 2023-05-13 DIAGNOSIS — R7303 Prediabetes: Secondary | ICD-10-CM

## 2023-05-13 DIAGNOSIS — E559 Vitamin D deficiency, unspecified: Secondary | ICD-10-CM | POA: Diagnosis not present

## 2023-05-13 DIAGNOSIS — F3289 Other specified depressive episodes: Secondary | ICD-10-CM

## 2023-05-13 DIAGNOSIS — Z6834 Body mass index (BMI) 34.0-34.9, adult: Secondary | ICD-10-CM

## 2023-05-13 MED ORDER — METFORMIN HCL 500 MG PO TABS: 500.0000 mg | ORAL_TABLET | Freq: Every day | ORAL | 0 refills | Status: DC

## 2023-05-13 MED ORDER — BUPROPION HCL ER (XL) 150 MG PO TB24
150.0000 mg | ORAL_TABLET | Freq: Every day | ORAL | 0 refills | Status: DC
Start: 2023-05-13 — End: 2023-07-15

## 2023-05-13 NOTE — Telephone Encounter (Signed)
 Azure Huegel (Key: [id number])  form thumbnail [facility] is reviewing your PA request. Typically an electronic response will be received within 24-72 hours. To check for an update later, open this request from your dashboard.  You may close this dialog and return to your dashboard to perform other tasks.

## 2023-05-13 NOTE — Patient Instructions (Signed)
 The 10-year ASCVD risk score (Arnett DK, et al., 2019) is: 2.2%   Values used to calculate the score:     Age: 58 years     Sex: Female     Is Non-Hispanic African American: No     Diabetic: No     Tobacco smoker: No     Systolic Blood Pressure: 113 mmHg     Is BP treated: Yes     HDL Cholesterol: 69 mg/dL     Total Cholesterol: 233 mg/dL

## 2023-05-13 NOTE — Progress Notes (Incomplete)
 Mackenzie Newman, D.O.  ABFM, ABOM Specializing in Clinical Bariatric Medicine  Office located at: [address] [place], [place]  [address]   Assessment and Plan:   FOR THE DISEASE OF OBESITY: BMI 34.0-34.9,adult  Current BMI 33.28 Obesity- START BMI 44 Assessment & Plan: Since last office visit on 04/15/2023, patient's muscle mass has increased by 0.2 lbs. Fat mass has decreased by 7 lbs. Total body water has decreased by 5 lbs.  Counseling done on how various foods will affect these numbers and how to maximize success  Total lbs lost to date: 49 lbs Total weight loss percentage to date: 19.68%   Brittish was prescribed Ozempic 1 mg once weekly for her obesity. Pt reports prior authorization expired on 05/08/2023 and was not put in, so she could not get her most recent prescription filled. Last shot was administered on 05/04/2023.   Recommended Dietary Goals Mackenzie Newman is currently in the action stage of change. As such, her goal is to continue weight management plan.  She has agreed to: continue current plan   Behavioral Intervention We discussed the following today: continue to work on maintaining a reduced calorie state, getting the recommended amount of protein, incorporating whole foods, making healthy choices, staying well hydrated and practicing mindfulness when eating.  Additional resources provided today: None  Evidence-based interventions for health behavior change were utilized today including the discussion of self monitoring techniques, problem-solving barriers and SMART goal setting techniques.   Regarding patient's less desirable eating habits and patterns, we employed the technique of small changes.   Pt will specifically work on: Exercise 30 mins 5 days a week for next visit.    Recommended Physical Activity Goals Angeline has been advised to work up to 150 minutes of moderate intensity aerobic activity a week and strengthening exercises 2-3 times per week for  cardiovascular health, weight loss maintenance and preservation of muscle mass.   She has agreed to :  Think about enjoyable ways to increase daily physical activity and overcoming barriers to exercise and Increase physical activity in their day and reduce sedentary time (increase NEAT).   Pharmacotherapy We both agreed to :  Continue current medication regimen   FOR ASSOCIATED CONDITIONS ADDRESSED TODAY:  Emotional Eating Assessment & Plan: Shatiqua is currently managing this with Wellbutrin XL 150 mg daily. Hunger/cravings well controlled. No adverse SE. Mackenzie Newman endorses her emotional eating has gotten better because events in her life have calmed down. Additionally, she is being intentional with mindful eating. Continue following [facility]. Will continue to monitor condition closely.   Relevant Orders: -     buPROPion HCl ER (XL); Take 1 tablet (150 mg total) by mouth daily.  Dispense: 90 tablet; Refill: 0   Pre-diabetes Assessment & Plan: Mackenzie Newman is compliant with Metformin 500 mg daily. She denies any GI upset. Hunger/cravings well controlled. Pt reports not eating sweets and sugars that she used to because she does not enjoy them as much. Continue current medication regimen. Will continue to monitor condition closely.   Relevant Orders: -     metFORMIN HCl; Take 1 tablet (500 mg total) by mouth daily.  Dispense: 90 tablet; Refill: 0   Vitamin D deficiency Assessment & Plan: Mackenzie Newman treats Vit D deficiency with ERGO 50000 every 14 days. Pt is compliant with supplementation. No concerns today in this regard. Will recheck levels 3 months from last obtained labs.   Follow up:   Return in about 5 weeks (around 06/17/2023). She was informed  of the importance of frequent follow up visits to maximize her success with intensive lifestyle modifications for her multiple health conditions.  Subjective:   Chief complaint: Obesity Mackenzie Newman is here to discuss her progress with her obesity treatment  plan. She is on the the Category 3 Plan and states she is following her eating plan approximately 50% of the time. She states she is not exercising.  Interval History:  [name] is here for a follow up office visit. Since last OV on 04/15/2023, Mackenzie Newman is down 7 lbs. She endorses being intentional with increasing protein and decreasing carbs. Pt reports trying some exercise, but does not enjoy it. However, she has been more active when playing with granddaughter. Additionally, pt endorses not eating as much as previously d/t food tasting different in the last 3-4 months.    Pharmacotherapy for weight loss: She is currently taking Wellbutrin XL 150 mg daily, Metformin 500 mg daily, and Wegovy 1.7 mg once weekly.   Review of Systems:  Pertinent positives were addressed with patient today.  Reviewed by clinician on day of visit: allergies, medications, problem list, medical history, surgical history, family history, social history, and previous encounter notes.  Weight Summary and Biometrics   Weight Lost Since Last Visit: 7 lb  Weight Gained Since Last Visit: 0    Vitals Temp: 97.8 F (36.6 C) BP: 113/72 Pulse Rate: 77 SpO2: 98 %   Anthropometric Measurements Height: 5\' 5"  (1.651 m) Weight: 200 lb (90.7 kg) BMI (Calculated): 33.28 Weight at Last Visit: 207 lb Weight Lost Since Last Visit: 7 lb Weight Gained Since Last Visit: 0 Starting Weight: 249 lb Total Weight Loss (lbs): 49 lb (22.2 kg) Peak Weight: 249 lb   Body Composition  Body Fat %: 42 % Fat Mass (lbs): 84.4 lbs Muscle Mass (lbs): 110.6 lbs Total Body Water (lbs): 75 lbs Visceral Fat Rating : 11   Other Clinical Data Fasting: Yes Labs: No Today's Visit #: 20 Starting Date: 10/17/22    Objective:   PHYSICAL EXAM: There were no vitals taken for this visit. There is no height or weight on file to calculate BMI.  General: she is overweight, cooperative and in no acute distress. PSYCH: Has normal  mood, affect and thought process.   HEENT: EOMI, sclerae are anicteric. Lungs: Normal breathing effort, no conversational dyspnea. Extremities: Moves * 4 Neurologic: A and O * 3, good insight  DIAGNOSTIC DATA REVIEWED: BMET    Component Value Date/Time   NA 143 03/18/2023 1336   K 4.4 03/18/2023 1336   CL 104 03/18/2023 1336   CO2 20 03/18/2023 1336   GLUCOSE 81 03/18/2023 1336   BUN 20 03/18/2023 1336   CREATININE 0.99 03/18/2023 1336   CALCIUM 10.3 (H) 03/18/2023 1336   GFRNONAA 54 (L) 01/06/2020 0759   GFRAA 63 01/06/2020 0759   Lab Results  Component Value Date   HGBA1C 5.4 03/18/2023   HGBA1C 5.6 06/23/2019   Lab Results  Component Value Date   INSULIN 38.2 (H) 03/18/2023   INSULIN 25.2 (H) 10/16/2021   Lab Results  Component Value Date   TSH 3.070 10/16/2021   CBC    Component Value Date/Time   WBC 8.0 10/16/2021 0828   RBC 4.68 10/16/2021 0828   HGB 13.1 10/16/2021 0828   HCT 40.1 10/16/2021 0828   PLT 285 10/16/2021 0828   MCV 86 10/16/2021 0828   MCH 28.0 10/16/2021 0828   MCHC 32.7 10/16/2021 0828   RDW 12.9 10/16/2021 0828  Iron Studies No results found for: "IRON", "TIBC", "FERRITIN", "IRONPCTSAT" Lipid Panel     Component Value Date/Time   CHOL 233 (H) 03/18/2023 1336   TRIG 92 03/18/2023 1336   HDL 69 03/18/2023 1336   CHOLHDL 3.4 03/18/2023 1336   LDLCALC 148 (H) 03/18/2023 1336   Hepatic Function Panel     Component Value Date/Time   PROT 7.1 03/18/2023 1336   ALBUMIN 4.2 03/18/2023 1336   AST 12 03/18/2023 1336   ALT 11 03/18/2023 1336   ALKPHOS 102 03/18/2023 1336   BILITOT 0.3 03/18/2023 1336      Component Value Date/Time   TSH 3.070 10/16/2021 0828   Nutritional Lab Results  Component Value Date   VD25OH 47.7 03/18/2023   VD25OH 59.4 11/06/2022   VD25OH 46.6 08/16/2022    Attestations:   I, Camryn Mix, acting as a Stage manager for [employer], DO., have compiled all relevant documentation for today's office  visit on behalf of Thomasene Lot, DO, while in the presence of [employer], DO.  Reviewed by clinician on day of visit: allergies, medications, problem list, medical history, surgical history, family history, social history, and previous encounter notes pertinent to patient's obesity diagnosis. I have spent 44 minutes in the care of the patient today including: preparing to see patient (e.g. review and interpretation of tests, old notes ), obtaining and/or reviewing separately obtained history, performing a medically appropriate examination or evaluation, counseling and educating the patient, ordering medications, test or procedures, documenting clinical information in the electronic or other health care record, and independently interpreting results and communicating results to the patient, family, or caregiver   I have reviewed the above documentation for accuracy and completeness, and I agree with the above. Mackenzie Newman, D.O.  The 21st Century Cures Act was signed into law in 2016 which includes the topic of electronic health records.  This provides immediate access to information in MyChart.  This includes consultation notes, operative notes, office notes, lab results and pathology reports.  If you have any questions about what you read please let us know at your next visit so we can discuss your concerns and take corrective action if need be.  We are right here with you.

## 2023-05-13 NOTE — Telephone Encounter (Signed)
 Message from plan: Request Reference Number: [id number].  WEGOVY INJ 1.7MG  is approved through 11/13/2023.  Your patient may now fill this prescription and it will be covered. Authorization Expiration Date: November 13, 2023.   Patient notified via Mychart message.

## 2023-05-15 ENCOUNTER — Encounter: Payer: Self-pay | Admitting: Family Medicine

## 2023-05-15 DIAGNOSIS — F5101 Primary insomnia: Secondary | ICD-10-CM

## 2023-05-19 MED ORDER — TRAZODONE HCL 50 MG PO TABS: 25.0000 mg | ORAL_TABLET | Freq: Every evening | ORAL | 0 refills | Status: DC | PRN

## 2023-06-16 NOTE — Progress Notes (Unsigned)
 SUBJECTIVE: Discussed the use of AI scribe software for clinical note transcription with the patient, who gave verbal consent to proceed.  Chief Complaint: Obesity  Interim History: She has maintained her weight since her last visit.  Down 49 lbs overall TBW loss of 19.7%  Mackenzie Newman is here to discuss her progress with her obesity treatment plan. She is on the Category 3 Plan and states she is following her eating plan approximately 60 % of the time. She states she is exercising walking 60 minutes 4 times per week.  The patient is a 58 year old with obesity who presents for follow-up of her obesity treatment plan.  She has a history of obesity and is currently on a treatment plan that includes metformin 500 mg daily and Wegovy 1.7 mg weekly. She has successfully maintained her weight since the last visit and has lost close to 50 pounds overall, with a goal to lose an additional 25 to 30 pounds. She has a history of prediabetes with insulin resistance, hypertension, hypercholesterolemia, insomnia, and obstructive sleep apnea for which she uses CPAP. She also has chronic kidney disease stage 3A and elevated liver enzymes.  Her current medications include ergocalciferol 50,000 units every 14 days, losartan/hydrochlorothiazide 100/25 mg daily (half tablet) for blood pressure control, and bupropion 150 mg daily for emotional eating and cravings. She has not started taking metformin twice a day as previously discussed.  She is experiencing significant sleep disturbances, waking up 2 to 3 times a night and having difficulty returning to sleep. She reports getting only 2 to 3 hours of continuous sleep and describes waking up as 'planning her taxes awake.'  She reports hot flashes, describing them as feeling like 'nine million degrees' followed by feeling cold. She has not been on any estrogen supplements and has no personal or family history of breast cancer. She has been postmenopausal for a few years,  with symptoms worsening in the last six months. She has had a normal mammogram in the past 12 months.   She has been out of her Wegovy injections due to a prior authorization issue, resulting in a two-week lapse in medication. She is also out of omeprazole and plans to discuss this with her primary care provider who she has follow up with later this month. She also describes a lack of appetite, stating 'nothing sounds good' and 'eating is almost becoming a chore.'  She engages in physical activity, walking 3 to 4 nights a week for almost an hour each time. She has a few upcoming social events but does not anticipate these will disrupt her current routine.  OBJECTIVE: Visit Diagnoses: Problem List Items Addressed This Visit     Morbid obesity with starting BMI 44   Relevant Medications   Semaglutide-Weight Management (WEGOVY) 1.7 MG/0.75ML SOAJ   Insomnia   Essential hypertension   Pre-diabetes - Primary   Other Visit Diagnoses       Menopausal symptoms         BMI 33.0-33.9,adult Current BMI 33.3         Obesity Mackenzie Newman has achieved nearly 20% total body weight loss, losing close to 50 pounds on a obesity management program. She is currently on Wegovy 1.7 mg weekly and metformin 500 mg daily. She aims to lose an additional 25-30 pounds. Her weight and muscle mass are maintained, with a favorable visceral adipose rating of 11. She is experiencing a plateau in weight loss and engages in regular physical activity, walking 3-4 nights  a week for almost an hour.  - Refill Wegovy injection - Encourage continued physical activity - Consider adding ankle or wrist weights or even a weighted vest to enhance muscle mass maintenance Meds ordered this encounter  Medications   Semaglutide-Weight Management (WEGOVY) 1.7 MG/0.75ML SOAJ    Sig: Inject 1.7 mg into the skin once a week.    Dispense:  3 mL    Refill:  0    Insomnia/Menopausal symptoms Mackenzie Newman reports waking up 2-3 times a night  with difficulty returning to sleep. She is on a lower dose of bupropion, but has not noticed improvement in her sleep patterns. She experiences hot flashes, suspecting a connection between sleep disturbances and menopausal symptoms. She plans to discuss starting trazodone with her primary care provider, Doctor David Escort, as the current dose of 25-50 mg is ineffective. We also discussed considering hormone replacement therapy options, such as an estradiol patch or Veozah if not felt to be a good candidate for estradiol patch, to address vasomotor symptoms. Estradiol may help maintain weight loss and improve mood and sleep. - Discuss trazodone prescription with Doctor David Escort - Consider hormone replacement therapy options with Doctor David Escort or OB GYN  Hypertension Mackenzie Newman's blood pressure is well-controlled on losartan/hydrochlorothiazide 100/25 mg daily, half a tablet. She reports not taking her medication for four days without adverse effects on her blood pressure. Significant weight loss may have contributed to improved blood pressure control. She plans to discuss the possibility of discontinuing her blood pressure medication with Doctor Bacigalupo. BP Readings from Last 3 Encounters:  06/17/23 120/77  05/13/23 113/72  04/15/23 114/68   Lab Results  Component Value Date   NA 143 03/18/2023   CL 104 03/18/2023   K 4.4 03/18/2023   CO2 20 03/18/2023   BUN 20 03/18/2023   CREATININE 0.99 03/18/2023   EGFR 67 03/18/2023   CALCIUM 10.3 (H) 03/18/2023   ALBUMIN 4.2 03/18/2023   GLUCOSE 81 03/18/2023   Continue to work on nutrition plan to promote weight loss and improve BP control.  - Discuss potential discontinuation of blood pressure medication with Doctor [facility]    General Health Maintenance Mackenzie Newman engages in regular physical activity and has a healthy vitamin D level at 47. She has had a recent mammogram with normal results. She is considering adding weight training to her  routine to maintain muscle mass. - Refill ergocalciferol prescription - Encourage continued regular physical activity - Consider adding weight training to routine  Follow-up Mackenzie Newman has a follow-up appointment with Doctor Opalski on May 12th and plans to alternate follow-up visits between Doctor Opalski and the current provider.   Vitals Temp: 97.9 F (36.6 C) BP: 120/77 Pulse Rate: 67 SpO2: 99 %   Anthropometric Measurements Height: 5\' 5"  (1.651 m) Weight: 200 lb (90.7 kg) BMI (Calculated): 33.28 Weight at Last Visit: 200 lb Weight Lost Since Last Visit: 0 Weight Gained Since Last Visit: 0 Starting Weight: 249 lb Total Weight Loss (lbs): 49 lb (22.2 kg) Peak Weight: 249 lb   Body Composition  Body Fat %: 42.5 % Fat Mass (lbs): 85 lbs Muscle Mass (lbs): 109.4 lbs Total Body Water (lbs): 76.4 lbs Visceral Fat Rating : 11   Other Clinical Data Fasting: yes Labs: no Today's Visit #: 21 Starting Date: 10/17/22     ASSESSMENT AND PLAN:  Diet: Mckinzy is currently in the action stage of change. As such, her goal is to continue with weight loss efforts. She has agreed to Category 3 Plan.  Exercise: Chasmine has been instructed to work up to a goal of 150 minutes of combined cardio and strengthening exercise per week for weight loss and overall health benefits.   Behavior Modification:  We discussed the following Behavioral Modification Strategies today: increasing lean protein intake, decreasing simple carbohydrates, increasing vegetables, increase H2O intake, increase high fiber foods, no skipping meals, emotional eating strategies , avoiding temptations, and planning for success. We discussed various medication options to help Kienna with her weight loss efforts and we both agreed to continue current obesity treatment plan and continue to work on nutritional and behavioral strategies to promote weight loss.  .  Return in about 4 weeks (around 07/15/2023).Aaron Aas She was  informed of the importance of frequent follow up visits to maximize her success with intensive lifestyle modifications for her multiple health conditions.  Attestation Statements:   Reviewed by clinician on day of visit: allergies, medications, problem list, medical history, surgical history, family history, social history, and previous encounter notes.   Time spent on visit including pre-visit chart review and post-visit care and charting was 32 minutes.    Sunita Demond, PA-C

## 2023-06-17 ENCOUNTER — Ambulatory Visit (INDEPENDENT_AMBULATORY_CARE_PROVIDER_SITE_OTHER): Payer: Managed Care, Other (non HMO) | Admitting: Physician Assistant

## 2023-06-17 ENCOUNTER — Encounter (INDEPENDENT_AMBULATORY_CARE_PROVIDER_SITE_OTHER): Payer: Self-pay | Admitting: Physician Assistant

## 2023-06-17 VITALS — BP 120/77 | HR 67 | Temp 97.9°F | Ht 65.0 in | Wt 200.0 lb

## 2023-06-17 DIAGNOSIS — G4700 Insomnia, unspecified: Secondary | ICD-10-CM

## 2023-06-17 DIAGNOSIS — F5101 Primary insomnia: Secondary | ICD-10-CM

## 2023-06-17 DIAGNOSIS — F3289 Other specified depressive episodes: Secondary | ICD-10-CM

## 2023-06-17 DIAGNOSIS — R7303 Prediabetes: Secondary | ICD-10-CM

## 2023-06-17 DIAGNOSIS — N951 Menopausal and female climacteric states: Secondary | ICD-10-CM

## 2023-06-17 DIAGNOSIS — Z6833 Body mass index (BMI) 33.0-33.9, adult: Secondary | ICD-10-CM

## 2023-06-17 DIAGNOSIS — E559 Vitamin D deficiency, unspecified: Secondary | ICD-10-CM

## 2023-06-17 DIAGNOSIS — I10 Essential (primary) hypertension: Secondary | ICD-10-CM

## 2023-06-17 MED ORDER — WEGOVY 1.7 MG/0.75ML ~~LOC~~ SOAJ: 1.7000 mg | SUBCUTANEOUS | 0 refills | Status: DC

## 2023-06-19 ENCOUNTER — Other Ambulatory Visit: Payer: Self-pay

## 2023-06-19 ENCOUNTER — Ambulatory Visit
Admission: RE | Admit: 2023-06-19 | Discharge: 2023-06-19 | Disposition: A | Discharge status: Home / Self Care | Source: Ambulatory Visit

## 2023-06-19 VITALS — BP 159/63 | HR 82 | Temp 97.4°F | Resp 18

## 2023-06-19 DIAGNOSIS — J069 Acute upper respiratory infection, unspecified: Secondary | ICD-10-CM | POA: Diagnosis not present

## 2023-06-19 DIAGNOSIS — H66002 Acute suppurative otitis media without spontaneous rupture of ear drum, left ear: Secondary | ICD-10-CM

## 2023-06-19 MED ORDER — BENZONATATE 200 MG PO CAPS: 200.0000 mg | ORAL_CAPSULE | Freq: Three times a day (TID) | ORAL | 0 refills | Status: AC | PRN

## 2023-06-19 MED ORDER — AMOXICILLIN 875 MG PO TABS
875.0000 mg | ORAL_TABLET | Freq: Two times a day (BID) | ORAL | 0 refills | Status: AC
Start: 2023-06-19 — End: 2023-06-26

## 2023-06-19 MED ORDER — AZELASTINE HCL 0.1 % NA SOLN: 1.0000 | Freq: Two times a day (BID) | NASAL | 1 refills | Status: DC

## 2023-06-19 MED ORDER — FEXOFENADINE HCL 180 MG PO TABS
180.0000 mg | ORAL_TABLET | Freq: Every day | ORAL | 1 refills | Status: AC
Start: 2023-06-19 — End: ?

## 2023-06-19 NOTE — Discharge Instructions (Signed)
 1. Non-recurrent acute suppurative otitis media of left ear without spontaneous rupture of tympanic membrane (Primary) - amoxicillin (AMOXIL) 875 MG tablet; Take 1 tablet (875 mg total) by mouth 2 (two) times daily for 7 days.  Dispense: 14 tablet; Refill: 0  2. URI with cough and congestion - azelastine (ASTELIN) 0.1 % nasal spray; Place 1 spray into both nostrils 2 (two) times daily. Use in each nostril as directed  Dispense: 30 mL; Refill: 1 - fexofenadine (ALLEGRA) 180 MG tablet; Take 1 tablet (180 mg total) by mouth daily.  Dispense: 60 tablet; Refill: 1 - benzonatate (TESSALON) 200 MG capsule; Take 1 capsule (200 mg total) by mouth 3 (three) times daily as needed for cough.  Dispense: 20 capsule; Refill: 0 -Continue to monitor symptoms for any change in severity if there is any escalation of current symptoms or development of new symptoms follow-up in ER for further evaluation and management.

## 2023-06-19 NOTE — ED Provider Notes (Signed)
 [facility]   Note:  This document was prepared using Conservation officer, historic buildings and may include unintentional dictation errors.  MRN: [id number] DOB: 29-Aug-1965  Subjective:   Mackenzie Newman is a 59 y.o. female presenting for sore throat, nasal sinus congestion, cough, laryngitis, and left ear pain x 3 days.  Patient has not been taking any over-the-counter medication to treat symptoms.  Patient denies any past history of seasonal allergies.  States that her 72-year-old granddaughter has been sick from daycare with cold-like symptoms.  Patient also reports her husband has developed some symptoms.  No fever, shortness of breath, chest pain, weakness, dizziness.  No current facility-administered medications for this encounter.  Current Outpatient Medications:    amoxicillin (AMOXIL) 875 MG tablet, Take 1 tablet (875 mg total) by mouth 2 (two) times daily for 7 days., Disp: 14 tablet, Rfl: 0   azelastine (ASTELIN) 0.1 % nasal spray, Place 1 spray into both nostrils 2 (two) times daily. Use in each nostril as directed, Disp: 30 mL, Rfl: 1   benzonatate (TESSALON) 200 MG capsule, Take 1 capsule (200 mg total) by mouth 3 (three) times daily as needed for cough., Disp: 20 capsule, Rfl: 0   fexofenadine (ALLEGRA) 180 MG tablet, Take 1 tablet (180 mg total) by mouth daily., Disp: 60 tablet, Rfl: 1   buPROPion (WELLBUTRIN XL) 150 MG 24 hr tablet, Take 1 tablet (150 mg total) by mouth daily., Disp: 90 tablet, Rfl: 0   losartan-hydrochlorothiazide (HYZAAR) 100-25 MG tablet, Take 0.5 tablets by mouth daily., Disp: 30 tablet, Rfl: 0   metFORMIN (GLUCOPHAGE) 500 MG tablet, Take 1 tablet (500 mg total) by mouth daily., Disp: 90 tablet, Rfl: 0   omeprazole (PRILOSEC) 20 MG capsule, TAKE 1 CAPSULE(20 MG) BY MOUTH DAILY, Disp: 90 capsule, Rfl: 0   Semaglutide-Weight Management (WEGOVY) 1.7 MG/0.75ML SOAJ, Inject 1.7 mg into the skin once a week., Disp: 3 mL, Rfl: 0   traZODone (DESYREL) 50  MG tablet, Take 0.5-1 tablets (25-50 mg total) by mouth at bedtime as needed for sleep., Disp: 30 tablet, Rfl: 0   Vitamin D, Ergocalciferol, (DRISDOL) 1.25 MG (50000 UNIT) CAPS capsule, Take 1 capsule (50,000 Units total) by mouth every 14 (fourteen) days., Disp: 6 capsule, Rfl: 0   zolpidem (AMBIEN) 10 MG tablet, Take 1 tablet (10 mg total) by mouth at bedtime as needed. for sleep, Disp: 30 tablet, Rfl: 1   No Known Allergies  Past Medical History:  Diagnosis Date   High blood pressure    High cholesterol    Obesity    Pre-diabetes      Past Surgical History:  Procedure Laterality Date   CESAREAN SECTION     COLONOSCOPY WITH PROPOFOL N/A 08/02/2017   Procedure: COLONOSCOPY WITH PROPOFOL;  Surgeon: Irby Mannan, MD;  Location: [facility] END[facility]COPY;  Service: Endoscopy;  Laterality: N/A;   MOHS SURGERY      Family History  Adopted: Yes  Problem Relation Age of Onset   Breast cancer Neg Hx     Social History   Tobacco Use   Smoking status: Never   Smokeless tobacco: Never  Vaping Use   Vaping status: Never Used  Substance Use Topics   Alcohol use: No   Drug use: No    ROS Refer to HPI for ROS details.  Objective:   Vitals: BP (!) 159/63 (BP Location: Left Arm)   Pulse 82   Temp (!) 97.4 F (36.3 C) (Temporal)   Resp 18   SpO2  96%   Physical Exam Vitals and nursing note reviewed.  Constitutional:      General: She is not in acute distress.    Appearance: Normal appearance. She is well-developed. She is not ill-appearing or toxic-appearing.  HENT:     Head: Normocephalic.     Right Ear: Tympanic membrane, ear canal and external ear normal.     Left Ear: Ear canal and external ear normal. Tympanic membrane is injected, erythematous and bulging. Tympanic membrane is not perforated.     Nose: Congestion and rhinorrhea present.     Mouth/Throat:     Mouth: Mucous membranes are moist.     Pharynx: Oropharynx is clear. No oropharyngeal exudate or posterior  oropharyngeal erythema.  Eyes:     General:        Right eye: No discharge.        Left eye: No discharge.     Extraocular Movements: Extraocular movements intact.     Conjunctiva/sclera: Conjunctivae normal.  Cardiovascular:     Rate and Rhythm: Normal rate and regular rhythm.     Heart sounds: No murmur heard. Pulmonary:     Effort: Pulmonary effort is normal. No respiratory distress.     Breath sounds: Normal breath sounds. No stridor. No wheezing, rhonchi or rales.  Musculoskeletal:     Cervical back: Neck supple.  Skin:    General: Skin is warm and dry.  Neurological:     General: No focal deficit present.     Mental Status: She is alert and oriented to person, place, and time.  Psychiatric:        Mood and Affect: Mood normal.        Behavior: Behavior normal.     Procedures  No results found for this or any previous visit (from the past 24 hours).  No results found.   Assessment and Plan :     Discharge Instructions      1. Non-recurrent acute suppurative otitis media of left ear without spontaneous rupture of tympanic membrane (Primary) - amoxicillin (AMOXIL) 875 MG tablet; Take 1 tablet (875 mg total) by mouth 2 (two) times daily for 7 days.  Dispense: 14 tablet; Refill: 0  2. URI with cough and congestion - azelastine (ASTELIN) 0.1 % nasal spray; Place 1 spray into both nostrils 2 (two) times daily. Use in each nostril as directed  Dispense: 30 mL; Refill: 1 - fexofenadine (ALLEGRA) 180 MG tablet; Take 1 tablet (180 mg total) by mouth daily.  Dispense: 60 tablet; Refill: 1 - benzonatate (TESSALON) 200 MG capsule; Take 1 capsule (200 mg total) by mouth 3 (three) times daily as needed for cough.  Dispense: 20 capsule; Refill: 0 -Continue to monitor symptoms for any change in severity if there is any escalation of current symptoms or development of new symptoms follow-up in ER for further evaluation and management.        Mackenzie Newman   Mackenzie Newman,  [place], [place] 06/19/23 1021

## 2023-06-19 NOTE — ED Triage Notes (Addendum)
 Patient presents to [facility] for evaluation of sore throat Monday that got significantly worse on Tuesday with sinus pressure, yellow mucous.  Today she feels it has "moved into my chest".  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  She has had left ear pressure/pain and intermittent hoarsenesss.  Patient politely declined testing to speak to the provider first

## 2023-06-22 ENCOUNTER — Other Ambulatory Visit: Payer: Self-pay | Admitting: Family Medicine

## 2023-06-22 DIAGNOSIS — F5101 Primary insomnia: Secondary | ICD-10-CM

## 2023-07-02 ENCOUNTER — Encounter: Payer: Self-pay | Admitting: Family Medicine

## 2023-07-02 ENCOUNTER — Ambulatory Visit (INDEPENDENT_AMBULATORY_CARE_PROVIDER_SITE_OTHER): Payer: Self-pay | Admitting: Family Medicine

## 2023-07-02 VITALS — BP 110/55 | HR 74 | Ht 65.0 in | Wt 205.4 lb

## 2023-07-02 DIAGNOSIS — F5101 Primary insomnia: Secondary | ICD-10-CM | POA: Diagnosis not present

## 2023-07-02 DIAGNOSIS — R7303 Prediabetes: Secondary | ICD-10-CM

## 2023-07-02 DIAGNOSIS — E559 Vitamin D deficiency, unspecified: Secondary | ICD-10-CM

## 2023-07-02 DIAGNOSIS — Z0001 Encounter for general adult medical examination with abnormal findings: Secondary | ICD-10-CM

## 2023-07-02 DIAGNOSIS — E7800 Pure hypercholesterolemia, unspecified: Secondary | ICD-10-CM

## 2023-07-02 DIAGNOSIS — N1831 Chronic kidney disease, stage 3a: Secondary | ICD-10-CM

## 2023-07-02 DIAGNOSIS — G4733 Obstructive sleep apnea (adult) (pediatric): Secondary | ICD-10-CM

## 2023-07-02 DIAGNOSIS — I10 Essential (primary) hypertension: Secondary | ICD-10-CM

## 2023-07-02 DIAGNOSIS — Z0000 Encounter for general adult medical examination without abnormal findings: Secondary | ICD-10-CM

## 2023-07-02 DIAGNOSIS — Z6834 Body mass index (BMI) 34.0-34.9, adult: Secondary | ICD-10-CM

## 2023-07-02 DIAGNOSIS — Z23 Encounter for immunization: Secondary | ICD-10-CM

## 2023-07-02 MED ORDER — TRAZODONE HCL 50 MG PO TABS: 25.0000 mg | ORAL_TABLET | Freq: Every evening | ORAL | 1 refills | Status: DC | PRN

## 2023-07-02 MED ORDER — ZOLPIDEM TARTRATE 10 MG PO TABS: 10.0000 mg | ORAL_TABLET | Freq: Every evening | ORAL | 1 refills | Status: DC | PRN

## 2023-07-02 MED ORDER — GUAIFENESIN-CODEINE 100-10 MG/5ML PO SOLN: 10.0000 mL | Freq: Three times a day (TID) | ORAL | 0 refills | Status: AC | PRN

## 2023-07-02 MED ORDER — LOSARTAN POTASSIUM-HCTZ 50-12.5 MG PO TABS: 1.0000 | ORAL_TABLET | Freq: Every day | ORAL | 1 refills | Status: DC

## 2023-07-02 NOTE — Assessment & Plan Note (Signed)
 Taking ambien  rarely Continue trazodone  qhs Well controlled with CPAP mostly

## 2023-07-02 NOTE — Progress Notes (Signed)
 Complete physical exam   [name]   DOB: 1966-01-19   58 y.o. Female  MRN: [id number] Visit Date: 07/02/2023  Today's healthcare provider: Aden Agreste, MD   Chief Complaint  Patient presents with   Annual Exam    Diet -  Low fats, sodium and carb Exercise - walking at least 3 days a week for 45 minutes Feeling - well Sleeping - fairly well with difficulty staying asleep but sleeping better with help of trazodone  as it helps her go back to sleep easier Concerns - none     Medication Refill    Losartan  Potassium 100-25 mg tablet patient would like lower dose so that she does not have to continue to break into halves as it is a hassle in the mornings due to being really big   Subjective    Mackenzie Newman is a 58 y.o. female who presents today for a complete physical exam.    Discussed the use of AI scribe software for clinical note transcription with the patient, who gave verbal consent to proceed.  History of Present Illness   Mackenzie Newman is a 58 year old female who presents for an annual physical.  She manages hypertension with losartan  hydrochlorothiazide  50-12.5 mg daily, maintaining blood pressure on the low side of normal without experiencing lightheadedness or dizziness. She uses a CPAP machine for obstructive sleep apnea, which she finds essential for sleep.  She is on Wegovy  1.7 mg weekly for weight management, resulting in a nearly 50-pound weight loss and reduced cravings. Her medication regimen includes Wellbutrin  XL 150 mg daily, metformin  500 mg daily, and omeprazole  20 mg daily. Trazodone  and Ambien  are prescribed for sleep, with trazodone  aiding in returning to sleep after nighttime awakenings.  She recently experienced an ear infection treated with antibiotics and has a persistent, non-productive cough for two weeks. The cough is described as 'naggy' with itchy ears. Tessalon  Perles and a non-codeine  cough medicine provide partial relief.         Last depression screening scores    07/02/2023    8:31 AM 12/24/2022    8:57 AM 07/31/2022    2:26 PM  PHQ 2/9 Scores  PHQ - 2 Score 0 0 0   Last fall risk screening    07/02/2023    8:30 AM  Fall Risk   Falls in the past year? 0  Number falls in past yr: 0  Injury with Fall? 0  Risk for fall due to : No Fall Risks  Follow up Falls evaluation completed        Medications: Outpatient Medications Prior to Visit  Medication Sig   benzonatate  (TESSALON ) 200 MG capsule Take 1 capsule (200 mg total) by mouth 3 (three) times daily as needed for cough.   buPROPion  (WELLBUTRIN  XL) 150 MG 24 hr tablet Take 1 tablet (150 mg total) by mouth daily.   fexofenadine  (ALLEGRA ) 180 MG tablet Take 1 tablet (180 mg total) by mouth daily.   metFORMIN  (GLUCOPHAGE ) 500 MG tablet Take 1 tablet (500 mg total) by mouth daily.   omeprazole  (PRILOSEC) 20 MG capsule TAKE 1 CAPSULE(20 MG) BY MOUTH DAILY   Semaglutide -Weight Management (WEGOVY ) 1.7 MG/0.75ML SOAJ Inject 1.7 mg into the skin once a week.   Vitamin D , Ergocalciferol , (DRISDOL ) 1.25 MG (50000 UNIT) CAPS capsule Take 1 capsule (50,000 Units total) by mouth every 14 (fourteen) days.   [DISCONTINUED] losartan -hydrochlorothiazide  (HYZAAR) 100-25 MG tablet Take 0.5 tablets by mouth daily.   [  DISCONTINUED] traZODone  (DESYREL ) 50 MG tablet TAKE 1/2 TO 1 TABLET(25 TO 50 MG) BY MOUTH AT BEDTIME AS NEEDED FOR SLEEP   [DISCONTINUED] zolpidem  (AMBIEN ) 10 MG tablet Take 1 tablet (10 mg total) by mouth at bedtime as needed. for sleep   [DISCONTINUED] azelastine  (ASTELIN ) 0.1 % nasal spray Place 1 spray into both nostrils 2 (two) times daily. Use in each nostril as directed (Patient not taking: Reported on 07/02/2023)   No facility-administered medications prior to visit.    Review of Systems    Objective    BP (!) 110/55 (BP Location: Left Arm, Patient Position: Sitting, Cuff Size: Large)   Pulse 74   Ht 5\' 5"  (1.651 m)   Wt 205 lb 6.4 oz  (93.2 kg)   SpO2 100%   BMI 34.18 kg/m    Physical Exam Vitals reviewed.  Constitutional:      General: She is not in acute distress.    Appearance: Normal appearance. She is well-developed. She is not diaphoretic.  HENT:     Head: Normocephalic and atraumatic.     Right Ear: Tympanic membrane, ear canal and external ear normal.     Left Ear: Tympanic membrane, ear canal and external ear normal.     Nose: Nose normal.     Mouth/Throat:     Mouth: Mucous membranes are moist.     Pharynx: Oropharynx is clear. No oropharyngeal exudate.  Eyes:     General: No scleral icterus.    Conjunctiva/sclera: Conjunctivae normal.     Pupils: Pupils are equal, round, and reactive to light.  Neck:     Thyroid: No thyromegaly.  Cardiovascular:     Rate and Rhythm: Normal rate and regular rhythm.     Heart sounds: Normal heart sounds. No murmur heard. Pulmonary:     Effort: Pulmonary effort is normal. No respiratory distress.     Breath sounds: Normal breath sounds. No wheezing or rales.  Abdominal:     General: There is no distension.     Palpations: Abdomen is soft.     Tenderness: There is no abdominal tenderness.  Musculoskeletal:        General: No deformity.     Cervical back: Neck supple.     Right lower leg: No edema.     Left lower leg: No edema.  Lymphadenopathy:     Cervical: No cervical adenopathy.  Skin:    General: Skin is warm and dry.     Findings: No rash.  Neurological:     Mental Status: She is alert and oriented to person, place, and time. Mental status is at baseline.     Gait: Gait normal.  Psychiatric:        Mood and Affect: Mood normal.        Behavior: Behavior normal.        Thought Content: Thought content normal.      No results found for any visits on 07/02/23.  Assessment & Plan    Routine Health Maintenance and Physical Exam  Exercise Activities and Dietary recommendations  Goals   None     Immunization History  Administered Date(s)  Administered   Influenza, Seasonal, Injecte, Preservative Fre 12/24/2022   Influenza,inj,Quad PF,6+ Mos 02/11/2020, 12/02/2020, 12/25/2021   PFIZER(Purple Top)SARS-COV-2 Vaccination 05/19/2019, 06/09/2019, 03/01/2020   PNEUMOCOCCAL CONJUGATE-20 07/02/2023   Tdap 09/20/2014   Zoster Recombinant(Shingrix) 09/29/2020, 12/02/2020    Health Maintenance  Topic Date Due   COVID-19 Vaccine (4 - 2024-25 season) 11/04/2022  INFLUENZA VACCINE  10/04/2023   Cervical Cancer Screening (HPV/Pap Cotest)  06/17/2024   DTaP/Tdap/Td (2 - Td or Tdap) 09/19/2024   MAMMOGRAM  03/21/2025   Colonoscopy  08/03/2027   Pneumococcal Vaccine 67-27 Years old  Completed   Hepatitis C Screening  Completed   HIV Screening  Completed   Zoster Vaccines- Shingrix  Completed   HPV VACCINES  Aged Out   Meningococcal B Vaccine  Aged Out    Discussed health benefits of physical activity, and encouraged her to engage in regular exercise appropriate for her age and condition.  Problem List Items Addressed This Visit       Cardiovascular and Mediastinum   Essential hypertension   Blood pressure on the low side of normal. No symptoms of lightheadedness or dizziness. Losartan  hydrochlorothiazide  dose adjusted to 50/12.5 mg to avoid splitting tablets. Potential need for further adjustment with continued weight loss. - Prescribe losartan  hydrochlorothiazide  50/12.5 mg daily.      Relevant Medications   losartan -hydrochlorothiazide  (HYZAAR) 50-12.5 MG tablet     Respiratory   OSA on CPAP   Compliant with CPAP therapy, which is effective and necessary for sleep. - Continue CPAP therapy.         Genitourinary   Stage 3a chronic kidney disease (HCC)   Chronic and stable Recheck metabolic panel Avoid nephrotoxic meds         Other   Insomnia   Taking ambien  rarely Continue trazodone  qhs Well controlled with CPAP mostly      Relevant Medications   traZODone  (DESYREL ) 50 MG tablet   Hyperlipidemia    Cholesterol levels checked in January are well-managed. No need for repeat testing as labs are routinely done every three months at the weight management clinic.      Relevant Medications   losartan -hydrochlorothiazide  (HYZAAR) 50-12.5 MG tablet   Vitamin D  deficiency   Pre-diabetes   Last A1c wnl - continue to monitor      BMI 34.0-34.9,adult Current BMI 34.7   On Wegovy  with nearly 50-pound weight loss. Wegovy  aids in reducing cravings.  - Continue Wegovy  1.7 mg weekly. - f/b HWW - Monitor weight and adjust medication as needed.      Other Visit Diagnoses       Encounter for annual physical exam    -  Primary     Immunization due       Relevant Orders   Pneumococcal conjugate vaccine 20-valent (Completed)     Need for pneumococcal vaccine       Relevant Orders   Pneumococcal conjugate vaccine 20-valent (Completed)          Wellness Visit Annual physical examination. Blood pressure is on the low side of normal. Mammogram up to date. Colonoscopy due in 2029. Pap smear due next year. Tetanus vaccine due next year. Shingles vaccine completed. Discussed pneumonia vaccine, recommended at age 71, which covers pneumococcal pneumonia and prevents many hospitalizations. - Administer pneumonia vaccine today. - Schedule Pap smear for next year.  Post-viral Cough Persistent non-productive cough following recent ear infection, likely post-viral. Tessalon  Perles and non-codeine  cough medicine partially effective. Discussed nighttime use of cough medicine with codeine , which can cause drowsiness and should not be mixed with Ambien . - Prescribe cough medicine with codeine  for nighttime use. - Advise against mixing Ambien  and codeine .      Return in about 6 months (around 01/01/2024) for chronic disease f/u.     Aden Agreste, MD  [facility] [phone] (phone)  [phone] (fax)  [facility]

## 2023-07-02 NOTE — Assessment & Plan Note (Signed)
Last A1c wnl - continue to monitor

## 2023-07-02 NOTE — Assessment & Plan Note (Signed)
 On Wegovy  with nearly 50-pound weight loss. Wegovy  aids in reducing cravings.  - Continue Wegovy  1.7 mg weekly. - f/b HWW - Monitor weight and adjust medication as needed.

## 2023-07-02 NOTE — Assessment & Plan Note (Signed)
 Compliant with CPAP therapy, which is effective and necessary for sleep. - Continue CPAP therapy.

## 2023-07-02 NOTE — Assessment & Plan Note (Signed)
 Cholesterol levels checked in January are well-managed. No need for repeat testing as labs are routinely done every three months at the weight management clinic.

## 2023-07-02 NOTE — Assessment & Plan Note (Signed)
 Blood pressure on the low side of normal. No symptoms of lightheadedness or dizziness. Losartan  hydrochlorothiazide  dose adjusted to 50/12.5 mg to avoid splitting tablets. Potential need for further adjustment with continued weight loss. - Prescribe losartan  hydrochlorothiazide  50/12.5 mg daily.

## 2023-07-02 NOTE — Assessment & Plan Note (Signed)
 Chronic and stable Recheck metabolic panel Avoid nephrotoxic meds

## 2023-07-15 ENCOUNTER — Encounter (INDEPENDENT_AMBULATORY_CARE_PROVIDER_SITE_OTHER): Payer: Self-pay | Admitting: Family Medicine

## 2023-07-15 ENCOUNTER — Ambulatory Visit (INDEPENDENT_AMBULATORY_CARE_PROVIDER_SITE_OTHER): Admitting: Family Medicine

## 2023-07-15 VITALS — BP 96/61 | HR 66 | Temp 98.0°F | Ht 65.0 in | Wt 202.0 lb

## 2023-07-15 DIAGNOSIS — F3289 Other specified depressive episodes: Secondary | ICD-10-CM

## 2023-07-15 DIAGNOSIS — I10 Essential (primary) hypertension: Secondary | ICD-10-CM

## 2023-07-15 DIAGNOSIS — F5089 Other specified eating disorder: Secondary | ICD-10-CM | POA: Diagnosis not present

## 2023-07-15 DIAGNOSIS — R7303 Prediabetes: Secondary | ICD-10-CM

## 2023-07-15 DIAGNOSIS — Z6834 Body mass index (BMI) 34.0-34.9, adult: Secondary | ICD-10-CM

## 2023-07-15 DIAGNOSIS — Z6833 Body mass index (BMI) 33.0-33.9, adult: Secondary | ICD-10-CM

## 2023-07-15 DIAGNOSIS — E669 Obesity, unspecified: Secondary | ICD-10-CM | POA: Diagnosis not present

## 2023-07-15 MED ORDER — BUPROPION HCL ER (XL) 150 MG PO TB24: 150.0000 mg | ORAL_TABLET | Freq: Every day | ORAL | 0 refills | Status: DC

## 2023-07-15 MED ORDER — WEGOVY 1.7 MG/0.75ML ~~LOC~~ SOAJ: 1.7000 mg | SUBCUTANEOUS | 0 refills | Status: DC

## 2023-07-15 MED ORDER — METFORMIN HCL 500 MG PO TABS: 500.0000 mg | ORAL_TABLET | Freq: Every day | ORAL | 0 refills | Status: DC

## 2023-07-15 NOTE — Progress Notes (Signed)
 Mackenzie Newman, D.O.  ABFM, ABOM Specializing in Clinical Bariatric Medicine  Office located at: [address] [place], [place]  [address]   Assessment and Plan:  No orders of the defined types were placed in this encounter.   There are no discontinued medications.   No orders of the defined types were placed in this encounter.   Consider rechecking fasting labs next OV  FOR THE DISEASE OF OBESITY:  BMI 34.0-34.9,adult Current BMI 33.61 Obesity- START BMI 44 Assessment & Plan: Since last office visit on 06/17/2023 patient's  Muscle mass has increased by 0.6 lb. Fat mass has increased by 1.6 lb. Total body water has increased by 2.2 lb.  Counseling done on how various foods will affect these numbers and how to maximize success  Total lbs lost to date: 47 lbs  Total weight loss percentage to date: 18.88%    Recommended Dietary Goals Mackenzie Newman is currently in the action stage of change. As such, her goal is to continue weight management plan.  She has agreed to: [facility] to the CAT 2 MP since pt is having difficulty getting in all the foods on her CAT 3 MP.    Behavioral Intervention We discussed the following today: increasing lean protein intake to established goals, avoiding skipping meals, and eating q 3-5 hours.   Additional resources provided today: Handout on CAT 2 meal plan and Handout on Common Characteristics of Successful Weight Losers,  Evidence-based interventions for health behavior change were utilized today including the discussion of self monitoring techniques, problem-solving barriers and SMART goal setting techniques.   Regarding patient's less desirable eating habits and patterns, we employed the technique of small changes.   Pt will specifically work on: n/a   Recommended Physical Activity Goals Mackenzie Newman has been advised to work up to 300-450 minutes of moderate intensity aerobic activity a week and strengthening exercises 2-3 times per week for  cardiovascular health, weight loss maintenance and preservation of muscle mass.   She has agreed to : increase her walking to 60 minutes 4 days a week.    Pharmacotherapy We both agreed to : continue same regimen    ASSOCIATED CONDITIONS ADDRESSED TODAY:  Pre-diabetes Assessment & Plan: Most recent A1c: Lab Results  Component Value Date   HGBA1C 5.4 03/18/2023   HGBA1C 5.4 11/06/2022   HGBA1C 5.6 08/16/2022   HgbA1c is at goal for age and comorbid conditions. On Metformin  500 mg daily with good adherence and no side effects.        Continue with reduced calorie meal plan low on processed crabs and simple sugars. Ongoing weight loss will improve insulin  resistance and glycemic control       Cravings okay Does not feel hungry   A1c well controlled.    Essential hypertension Assessment & Plan: Last 3 blood pressure readings in our office are as follows: BP Readings from Last 3 Encounters:  07/15/23 96/61  07/02/23 (!) 110/55  06/19/23 (!) 159/63   The 10-year ASCVD risk score (Arnett DK, et al., 2019) is: 1.6%  Lab Results  Component Value Date   CREATININE 0.99 03/18/2023     On 50-12.5 mg daily  Does not check BP at home Low today  Pt asx.   Did discuss with PCP her BP at last OV - they decided to keep her at same dose and PCP told her to monitor it at home and get back in touch with her I dizzy or if on the low  side.   Check at home every day    Emotional eating/cravings Assessment & Plan: On Wellbutrin  XL 150 mg daily with reported good compliance and tolerance. States her mood is well controlled. No c/o emotional eating today. Continue same regimen and prudent nutritional plan.    Follow up:   Return 08/12/2023 at 7:00 AM with [place], Evangelina Hilt, PA-C. She was informed of the importance of frequent follow up visits to maximize her success with intensive lifestyle modifications for her multiple health conditions.  Subjective:   Chief  complaint: Obesity Mackenzie Newman is here to discuss her progress with her obesity treatment plan. She is on the Category 3 Plan. She states she is walking 60 minutes 3 days per week.  Interval History:  Mackenzie Newman is here for a follow up office visit. Since last OV on 06/17/2023, Pt is up 2 lbs. States she is following her eating plan approximately 50-60% of the time. The other times she acknowledges skipping meals d/t not feeling hungry or "eating-off". States that she's been sleeping well; is on trazodone  50 mg prn per her PCP.   Pharmacotherapy that aid with  weight loss: She is currently taking Metformin  500 mg daily, Wellbutrin  XL 150 mg daily, and Wegovy  1.7 mg   Review of Systems:  Pertinent positives were addressed with patient today. Reviewed by clinician on day of visit: allergies, medications, problem list, medical history, surgical history, family history, social history, and previous encounter notes.  Weight Summary and Biometrics   Weight Lost Since Last Visit: 0  Weight Gained Since Last Visit: 2lb    Vitals Temp: 98 F (36.7 C) BP: 96/61 Pulse Rate: 66 SpO2: 99 %   Anthropometric Measurements Height: 5\' 5"  (1.651 m) Weight: 202 lb (91.6 kg) BMI (Calculated): 33.61 Weight at Last Visit: 200lb Weight Lost Since Last Visit: 0 Weight Gained Since Last Visit: 2lb Starting Weight: 249lb Total Weight Loss (lbs): 47 lb (21.3 kg) Peak Weight: 249lb   Body Composition  Body Fat %: 42.8 % Fat Mass (lbs): 86.6 lbs Muscle Mass (lbs): 110 lbs Total Body Water (lbs): 78.8 lbs Visceral Fat Rating : 11   Other Clinical Data Fasting: yes Labs: no Today's Visit #: 22 Starting Date: 10/17/22   Objective:   PHYSICAL EXAM: Blood pressure 96/61, pulse 66, temperature 98 F (36.7 C), height 5\' 5"  (1.651 m), weight 202 lb (91.6 kg), SpO2 99%. Body mass index is 33.61 kg/m.  General: she is overweight, cooperative and in no acute distress. PSYCH: Has normal mood,  affect and thought process.   HEENT: EOMI, sclerae are anicteric. Lungs: Normal breathing effort, no conversational dyspnea. Extremities: Moves * 4 Neurologic: A and O * 3, good insight  DIAGNOSTIC DATA REVIEWED: BMET    Component Value Date/Time   NA 143 03/18/2023 1336   K 4.4 03/18/2023 1336   CL 104 03/18/2023 1336   CO2 20 03/18/2023 1336   GLUCOSE 81 03/18/2023 1336   BUN 20 03/18/2023 1336   CREATININE 0.99 03/18/2023 1336   CALCIUM 10.3 (H) 03/18/2023 1336   GFRNONAA 54 (L) 01/06/2020 0759   GFRAA 63 01/06/2020 0759   Lab Results  Component Value Date   HGBA1C 5.4 03/18/2023   HGBA1C 5.6 06/23/2019   Lab Results  Component Value Date   INSULIN  38.2 (H) 03/18/2023   INSULIN  25.2 (H) 10/16/2021   Lab Results  Component Value Date   TSH 3.070 10/16/2021   CBC    Component Value Date/Time   WBC  8.0 10/16/2021 0828   RBC 4.68 10/16/2021 0828   HGB 13.1 10/16/2021 0828   HCT 40.1 10/16/2021 0828   PLT 285 10/16/2021 0828   MCV 86 10/16/2021 0828   MCH 28.0 10/16/2021 0828   MCHC 32.7 10/16/2021 0828   RDW 12.9 10/16/2021 0828   Iron Studies No results found for: "IRON", "TIBC", "FERRITIN", "IRONPCTSAT" Lipid Panel     Component Value Date/Time   CHOL 233 (H) 03/18/2023 1336   TRIG 92 03/18/2023 1336   HDL 69 03/18/2023 1336   CHOLHDL 3.4 03/18/2023 1336   LDLCALC 148 (H) 03/18/2023 1336   Hepatic Function Panel     Component Value Date/Time   PROT 7.1 03/18/2023 1336   ALBUMIN 4.2 03/18/2023 1336   AST 12 03/18/2023 1336   ALT 11 03/18/2023 1336   ALKPHOS 102 03/18/2023 1336   BILITOT 0.3 03/18/2023 1336      Component Value Date/Time   TSH 3.070 10/16/2021 0828   Nutritional Lab Results  Component Value Date   VD25OH 47.7 03/18/2023   VD25OH 59.4 11/06/2022   VD25OH 46.6 08/16/2022    Attestations:   I, Special Puri , acting as a Stage manager for [employer], DO., have compiled all relevant documentation for today's office  visit on behalf of Marceil Sensor, DO, while in the presence of [employer], DO.  Reviewed by clinician on day of visit: allergies, medications, problem list, medical history, surgical history, family history, social history, and previous encounter notes pertinent to patient's obesity diagnosis.  I have spent 40 *** minutes in the care of the patient today including: preparing to see patient (e.g. review and interpretation of tests, old notes ), obtaining and/or reviewing separately obtained history, performing a medically appropriate examination or evaluation, counseling and educating the patient, ordering medications, test or procedures, documenting clinical information in the electronic or other health care record, and independently interpreting results and communicating results to the patient, family, or caregiver   I have reviewed the above documentation for accuracy and completeness, and I agree with the above. Mackenzie Newman, D.O.  The 21st Century Cures Act was signed into law in 2016 which includes the topic of electronic health records.  This provides immediate access to information in MyChart.  This includes consultation notes, operative notes, office notes, lab results and pathology reports.  If you have any questions about what you read please let us  know at your next visit so we can discuss your concerns and take corrective action if need be.  We are right here with you.

## 2023-08-10 NOTE — Progress Notes (Unsigned)
 SUBJECTIVE: Discussed the use of AI scribe software for clinical note transcription with the patient, who gave verbal consent to proceed.  Chief Complaint: Obesity  Interim History: She has maintained her weight since last visit. Down 47 lbs overall TBW loss of 18.9%  Mackenzie Newman is here to discuss her progress with her obesity treatment plan. She is on the Category 2 Plan and states she is following her eating plan approximately 40 % of the time. She states she is exercising 60 minutes 4 times per week.  Pharmacotherapy that aid with  weight loss: She is currently taking Metformin  500 mg daily, Wellbutrin  XL 150 mg daily, and Wegovy  1.7 mg weekly  Mackenzie Newman is a 58 year old female with obesity, prediabetes, and hypertension who presents for follow-up of her obesity treatment plan.  She experiences a significant decrease in appetite, finding the thought of eating a full meal nearly nauseating. She is on a 1.7 mg dose of Wegovy  and wishes to lower the dosage due to her lack of appetite and the return of sugar cravings. Her typical daily intake includes one or two eggs in the morning, sometimes with whole wheat toast, leftovers or tuna with fruit for lunch, and chicken or beef with vegetables or salad for dinner. She has difficulty finishing meals, often consuming only a quarter of what she orders at restaurants.  She has a history of prediabetes with insulin  resistance and hypertension. She is currently fasting and has lost 47 pounds overall. She reports no current exercise routine beyond walking, which she is trying to make a regular habit. She has not yet incorporated weight training into her routine.  She takes vitamin D  once every fourteen days and is due for a refill.   She is taking care of her granddaughter, who is almost three years old, while her parents are traveling. She plans to take a week off to recuperate and sleep.  Plan fasting labs next visit- patient wants to wait until  next visit for labs OBJECTIVE: Visit Diagnoses: Problem List Items Addressed This Visit     Morbid obesity with starting BMI 44   Relevant Medications   Semaglutide -Weight Management (WEGOVY ) 1 MG/0.5ML SOAJ   Vitamin D  deficiency   Relevant Medications   Vitamin D , Ergocalciferol , (DRISDOL ) 1.25 MG (50000 UNIT) CAPS capsule   Essential hypertension   Pre-diabetes - Primary   Depression   Other Visit Diagnoses       BMI 33.0-33.9,adult Current BMI 33.6         Obesity Mackenzie Newman has lost 47 pounds while undergoing treatment for obesity. She is currently on Wegovy  1.7 mg but reports a lack of appetite and increased sugar cravings. The dosage will be reduced to Wegovy   1 mg weekly to address these issues. She is maintaining muscle mass with a slight reduction in adipose mass but has not yet achieved the goal of reducing overall body adipose percentage to 35% or less. Emphasis is placed on maintaining muscle mass and incorporating weight training to prevent muscle loss. The addition of gentle HIIT exercises is suggested to stimulate adipose utilization and potentially increase appetite. - Reduce Wegovy  dosage to 1 mg. - Encourage weight training, such as using ankle or wrist weights, to maintain muscle mass. - Suggest incorporating gentle HIIT exercises twice a week to stimulate adipose utilization and potentially increase appetite. Meds ordered this encounter  Medications   Vitamin D , Ergocalciferol , (DRISDOL ) 1.25 MG (50000 UNIT) CAPS capsule    Sig: Take 1  capsule (50,000 Units total) by mouth every 14 (fourteen) days.    Dispense:  6 capsule    Refill:  0   Semaglutide -Weight Management (WEGOVY ) 1 MG/0.5ML SOAJ    Sig: Inject 1 mg into the skin once a week.    Dispense:  2 mL    Refill:  0    Emotional Eating with Cravings Mackenzie Newman experiences emotional eating with cravings, particularly for sugar. If cravings persist after the dosage adjustment of Wegovy  and increased  physical activity, and is on Wellbutrin  XL 150 mg daily with 90 day supply. Reports no SE.  - Consider increasing or other medications to help with cravings. Continue to work on emotional eating strategies.   Prediabetes with Insulin  Resistance Mackenzie Newman has prediabetes with insulin  resistance. On Metformin  500 mg daily- no gi or other SE.  On Wegovy  and plan to reduce dose due to increased appetite suppression, under eating.   Lab Results  Component Value Date   HGBA1C 5.4 03/18/2023   HGBA1C 5.4 11/06/2022   HGBA1C 5.6 08/16/2022   Lab Results  Component Value Date   LDLCALC 148 (H) 03/18/2023   CREATININE 0.99 03/18/2023   INSULIN   Date Value Ref Range Status  03/18/2023 38.2 (H) 2.6 - 24.9 uIU/mL Final  ]Continue metformin  500 mg daily and  Wegovy  1 mg weekly (reduced dose due to under eating)  Continue working on nutrition plan to decrease simple carbohydrates, increase lean proteins and exercise to promote weight loss, improve glycemic control and prevent progression to Type 2 diabetes.  - Monitor prediabetes status and insulin  resistance. - Encourage physical activity to improve insulin  sensitivity.  Hypertension Mackenzie Newman has hypertension. Maintaining a healthy weight and increasing physical activity are likely beneficial for managing her hypertension. On losartan -hydrochlorothiazide  50-12.5 mg daily. No SE. BP Readings from Last 3 Encounters:  08/12/23 103/68  07/15/23 96/61  07/02/23 (!) 110/55  Continue losartan -hctz Continue to work on nutrition plan to promote weight loss and improve BP control.  - Encourage weight management and physical activity to aid in blood pressure control.  Vitamin D  deficiency.  Mackenzie Newman is advised to continue taking vitamin D  and to have her levels checked, along with B12, liver, and kidney function. - Refill vitamin D  prescription and take once every 14 days. Ergocalciferol  50,000 units q 14 days. No SE Last vitamin D  Lab Results  Component  Value Date   VD25OH 47.7 03/18/2023    Low vitamin D  levels can be associated with adiposity and may result in leptin resistance and weight gain. Also associated with fatigue.  Currently on vitamin D  supplementation without any adverse effects such as nausea, vomiting or muscle weakness.  - Check vitamin D  next visit. Meds ordered this encounter  Medications   Vitamin D , Ergocalciferol , (DRISDOL ) 1.25 MG (50000 UNIT) CAPS capsule    Sig: Take 1 capsule (50,000 Units total) by mouth every 14 (fourteen) days.    Dispense:  6 capsule    Refill:  0   Semaglutide -Weight Management (WEGOVY ) 1 MG/0.5ML SOAJ    Sig: Inject 1 mg into the skin once a week.    Dispense:  2 mL    Refill:  0    Follow-up Mackenzie Newman is scheduled to follow up in four weeks after her last injection to monitor her progress and adjust her treatment plan as needed. - Schedule follow-up appointment in four weeks.  Vitals Temp: 98.3 F (36.8 C) BP: 103/68 Pulse Rate: 75 SpO2: 96 %   Anthropometric Measurements Height: 5'  5" (1.651 m) Weight: 202 lb (91.6 kg) BMI (Calculated): 33.61 Weight at Last Visit: 202lb Weight Lost Since Last Visit: 0 Weight Gained Since Last Visit: 0 Starting Weight: 249lb Total Weight Loss (lbs): 47 lb (21.3 kg) Peak Weight: 249lb   Body Composition  Body Fat %: 42.7 % Fat Mass (lbs): 86.4 lbs Muscle Mass (lbs): 110.2 lbs Total Body Water (lbs): 77.8 lbs Visceral Fat Rating : 11   Other Clinical Data Fasting: yes Labs: no Today's Visit #: 23 Starting Date: 10/17/22     ASSESSMENT AND PLAN:  Diet: Mackenzie Newman is currently in the action stage of change. As such, her goal is to continue with weight loss efforts. She has agreed to Category 2 Plan.  Exercise: Mackenzie Newman has been instructed to work up to a goal of 150 minutes of combined cardio and strengthening exercise per week and discussed trying a HIIT program or consider using ankle or wrist weights while walking for weight  loss and overall health benefits.   Behavior Modification:  We discussed the following Behavioral Modification Strategies today: increasing lean protein intake, decreasing simple carbohydrates, increasing vegetables, increase H2O intake, increase high fiber foods, no skipping meals, meal planning and cooking strategies, avoiding temptations, and planning for success. We discussed various medication options to help Mackenzie Newman with her weight loss efforts and we both agreed to decrease Wegovy  to 1 mg weekly and continue other medications, continue to work on nutritional and behavioral strategies to promote weight loss.  .  Return in about 4 weeks (around 09/09/2023) for Fasting Lab.Aaron Aas She was informed of the importance of frequent follow up visits to maximize her success with intensive lifestyle modifications for her multiple health conditions.  Attestation Statements:   Reviewed by clinician on day of visit: allergies, medications, problem list, medical history, surgical history, family history, social history, and previous encounter notes.   Time spent on visit including pre-visit chart review and post-visit care and charting was 29 minutes.    Caesar Mannella, PA-C

## 2023-08-12 ENCOUNTER — Ambulatory Visit (INDEPENDENT_AMBULATORY_CARE_PROVIDER_SITE_OTHER): Admitting: Physician Assistant

## 2023-08-12 ENCOUNTER — Encounter (INDEPENDENT_AMBULATORY_CARE_PROVIDER_SITE_OTHER): Payer: Self-pay | Admitting: Physician Assistant

## 2023-08-12 VITALS — BP 103/68 | HR 75 | Temp 98.3°F | Ht 65.0 in | Wt 202.0 lb

## 2023-08-12 DIAGNOSIS — E559 Vitamin D deficiency, unspecified: Secondary | ICD-10-CM

## 2023-08-12 DIAGNOSIS — F32A Depression, unspecified: Secondary | ICD-10-CM | POA: Diagnosis not present

## 2023-08-12 DIAGNOSIS — I10 Essential (primary) hypertension: Secondary | ICD-10-CM

## 2023-08-12 DIAGNOSIS — F3289 Other specified depressive episodes: Secondary | ICD-10-CM

## 2023-08-12 DIAGNOSIS — F5089 Other specified eating disorder: Secondary | ICD-10-CM | POA: Diagnosis not present

## 2023-08-12 DIAGNOSIS — R7303 Prediabetes: Secondary | ICD-10-CM

## 2023-08-12 DIAGNOSIS — Z6833 Body mass index (BMI) 33.0-33.9, adult: Secondary | ICD-10-CM

## 2023-08-12 MED ORDER — VITAMIN D (ERGOCALCIFEROL) 1.25 MG (50000 UNIT) PO CAPS: 50000.0000 [IU] | ORAL_CAPSULE | ORAL | 0 refills | Status: DC

## 2023-08-12 MED ORDER — WEGOVY 1 MG/0.5ML ~~LOC~~ SOAJ: 1.0000 mg | SUBCUTANEOUS | 0 refills | Status: DC

## 2023-08-15 ENCOUNTER — Other Ambulatory Visit: Payer: Self-pay | Admitting: Family Medicine

## 2023-08-15 DIAGNOSIS — K219 Gastro-esophageal reflux disease without esophagitis: Secondary | ICD-10-CM

## 2023-09-09 ENCOUNTER — Encounter (INDEPENDENT_AMBULATORY_CARE_PROVIDER_SITE_OTHER): Payer: Self-pay | Admitting: Family Medicine

## 2023-09-09 ENCOUNTER — Ambulatory Visit (INDEPENDENT_AMBULATORY_CARE_PROVIDER_SITE_OTHER): Admitting: Family Medicine

## 2023-09-09 DIAGNOSIS — Z6833 Body mass index (BMI) 33.0-33.9, adult: Secondary | ICD-10-CM

## 2023-09-09 DIAGNOSIS — I10 Essential (primary) hypertension: Secondary | ICD-10-CM

## 2023-09-09 DIAGNOSIS — E669 Obesity, unspecified: Secondary | ICD-10-CM

## 2023-09-09 DIAGNOSIS — E559 Vitamin D deficiency, unspecified: Secondary | ICD-10-CM | POA: Diagnosis not present

## 2023-09-09 DIAGNOSIS — E538 Deficiency of other specified B group vitamins: Secondary | ICD-10-CM

## 2023-09-09 DIAGNOSIS — R7303 Prediabetes: Secondary | ICD-10-CM | POA: Diagnosis not present

## 2023-09-09 MED ORDER — WEGOVY 1 MG/0.5ML ~~LOC~~ SOAJ: 1.0000 mg | SUBCUTANEOUS | 0 refills | Status: DC

## 2023-09-09 NOTE — Progress Notes (Signed)
 Mackenzie Newman, D.O.  ABFM, ABOM Specializing in Clinical Bariatric Medicine  Office located at: [address] [place], [place]  [address]   Assessment and Plan:   Orders Placed This Encounter  Procedures   Comprehensive metabolic panel with GFR   Hemoglobin A1c   Insulin , random   Magnesium   Vitamin B12   VITAMIN D  25 Hydroxy (Vit-D Deficiency, Fractures)   TSH   Lipid panel    Medications Discontinued During This Encounter  Medication Reason   Semaglutide -Weight Management (WEGOVY ) 1 MG/0.5ML SOAJ Reorder     Meds ordered this encounter  Medications   Semaglutide -Weight Management (WEGOVY ) 1 MG/0.5ML SOAJ    Sig: Inject 1 mg into the skin once a week.    Dispense:  2 mL    Refill:  0     FOR THE DISEASE OF OBESITY:  BMI 33.0-33.9,adult Current BMI 33.78 Obesity- START BMI 44 Assessment & Plan: Since last office visit on 08/12/2023 patient's muscle mass has decreased by 0.8 lbs. Fat mass has increased by 2 lbs. Total body water has increased by 2 lbs.  Counseling done on how various foods will affect these numbers and how to maximize success  Total lbs lost to date: 46 lbs  Total weight loss percentage to date: 2.41%    Recommended Dietary Goals Glendy is currently in the action stage of change. As such, her goal is to continue weight management plan.  She has agreed to: continue current plan   Behavioral Intervention We discussed the following today: body fat percentage goal,  increasing lean protein intake to established goals and increasing vegetables  Additional resources provided today: None  Evidence-based interventions for health behavior change were utilized today including the discussion of self monitoring techniques, problem-solving barriers and SMART goal setting techniques.   Regarding patient's less desirable eating habits and patterns, we employed the technique of small changes.   Pt will specifically work on: n/a    Recommended  Physical Activity Goals Twila has been advised to work up to 300-450 minutes of moderate intensity aerobic activity a week and strengthening exercises 2-3 times per week for cardiovascular health, weight loss maintenance and preservation of muscle mass.   She has agreed to continue walking regimen and consider ADDING 2 sets of 10-15 reps of weight training 2-3 days/week.    Pharmacotherapy Reported a lack of appetite and increased sugar cravings on Wegovy  1.7 mg, therefore LOV Shawn Rayburn PA reduced Wegovy  dosage to 1 mg. Pt endorses better control of sugar cravings on Wegovy  1 mg and better appetite. No acute concerns. Continue Wegovy  1 mg - refill today.     ASSOCIATED CONDITIONS ADDRESSED TODAY:   Pre-diabetes Assessment & Plan: Lab Results  Component Value Date   HGBA1C 5.4 03/18/2023   HGBA1C 5.4 11/06/2022   HGBA1C 5.6 08/16/2022   INSULIN  38.2 (H) 03/18/2023   INSULIN  10.0 11/06/2022   INSULIN  23.1 08/16/2022    She has a history of Pre-DM with I.R. On Metformin  500 mg daily - no GI issues or other SE. Continue Metformin  therapy and working on nutrition plan to decrease simple carbohydrates, increase lean proteins and exercise to promote weight loss, improve glycemic control and prevent progression to Type 2 diabetes. Encouraged physical activity to improve insulin  sensitivity (see goal above). Recheck labs.     Essential hypertension Assessment & Plan: Last 3 blood pressure readings in our office are as follows: BP Readings from Last 3 Encounters:  09/09/23 123/79  08/12/23 103/68  07/15/23 96/61   The 10-year ASCVD risk score (Arnett DK, et al., 2019) is: 2.6%  Lab Results  Component Value Date   CREATININE 0.99 03/18/2023   Blood pressure is controlled on Hyzaar 50-12 mg daily. No acute concerns.  Continue medication. Recommend she checks her BP 1-2 times weekly. Monitor for orthostasis while losing weight. Recheck labs.     B12 deficiency due to  diet Assessment & Plan: Lab Results  Component Value Date   VITAMINB12 1,134 03/18/2023   Has a history of B12 deficiency. She has not taken her B12 supplement for the past 3 months. She discontinued it on her own because she felt the supplement was too sugary. Recheck B12 levels today. Continue nutrient rich diet.     Vitamin D  deficiency Assessment & Plan: Lab Results  Component Value Date   VD25OH 47.7 03/18/2023   VD25OH 59.4 11/06/2022   VD25OH 46.6 08/16/2022   Doing well on ergocalciferol  50,000 units q 14 days. Continue regimen and weight loss efforts. Recheck levels today.     Follow up:   Return 10/07/2023 at 7:00 AM with Rayburn, Elouise Phlegm, PA-C. She was informed of the importance of frequent follow up visits to maximize her success with intensive lifestyle modifications for her multiple health conditions.  Mackenzie Newman is aware that we will review all of her lab results at our next visit.  She is aware that if anything is critical/ life threatening with the results, we will be contacting her via MyChart prior to the office visit to discuss management.     Subjective:   Chief complaint: Obesity Alexandre is here to discuss her progress with her obesity treatment plan. She is on the Category 2 Plan and states she is following her eating plan approximately 50% of the time. She states she is walking 45 minutes 3 days per week.  Interval History:  Mackenzie Newman is here for a follow up office visit. Since last OV on 08/12/2023 , she is up 1 #. She enjoyed her 4th of July weekend and endorses indulging in cookies. Apart from that weekend, she has been diligent about increasing her protein intake and decreasing her simple carbs. Sleep wise, she uses her Trazodone  nightly.  Sleeps 8 hours and wakes up refreshed.    Pharmacotherapy that aid with weight loss: She is currently taking Wegovy  1 mg wkly, Metformin  500 mg daily, and Wellbutrin  XL 150 mg daily.    Review of  Systems:  Pertinent positives were addressed with patient today.  Reviewed by clinician on day of visit: allergies, medications, problem list, medical history, surgical history, family history, social history, and previous encounter notes.  Weight Summary and Biometrics   Weight Lost Since Last Visit: 0lb  Weight Gained Since Last Visit: 1lb   Vitals Temp: 98.7 F (37.1 C) BP: 123/79 Pulse Rate: 68 SpO2: 100 %   Anthropometric Measurements Height: 5' 5 (1.651 m) Weight: 203 lb (92.1 kg) BMI (Calculated): 33.78 Weight at Last Visit: 202lb Weight Lost Since Last Visit: 0lb Weight Gained Since Last Visit: 1lb Starting Weight: 249lb Total Weight Loss (lbs): 46 lb (20.9 kg) Peak Weight: 249lb   Body Composition  Body Fat %: 43.4 % Fat Mass (lbs): 88.4 lbs Muscle Mass (lbs): 109.4 lbs Total Body Water (lbs): 79.8 lbs Visceral Fat Rating : 12   Other Clinical Data Fasting: Yes Labs: Yes Today's Visit #: 24 Starting Date: 10/17/22    Objective:   PHYSICAL EXAM: Blood pressure  123/79, pulse 68, temperature 98.7 F (37.1 C), height 5' 5 (1.651 m), weight 203 lb (92.1 kg), SpO2 100%. Body mass index is 33.78 kg/m.  General: she is overweight, cooperative and in no acute distress. PSYCH: Has normal mood, affect and thought process.   HEENT: EOMI, sclerae are anicteric. Lungs: Normal breathing effort, no conversational dyspnea. Extremities: Moves * 4 Neurologic: A and O * 3, good insight  DIAGNOSTIC DATA REVIEWED: BMET    Component Value Date/Time   NA 143 03/18/2023 1336   K 4.4 03/18/2023 1336   CL 104 03/18/2023 1336   CO2 20 03/18/2023 1336   GLUCOSE 81 03/18/2023 1336   BUN 20 03/18/2023 1336   CREATININE 0.99 03/18/2023 1336   CALCIUM 10.3 (H) 03/18/2023 1336   GFRNONAA 54 (L) 01/06/2020 0759   GFRAA 63 01/06/2020 0759   Lab Results  Component Value Date   HGBA1C 5.4 03/18/2023   HGBA1C 5.6 06/23/2019   Lab Results  Component Value Date    INSULIN  38.2 (H) 03/18/2023   INSULIN  25.2 (H) 10/16/2021   Lab Results  Component Value Date   TSH 3.070 10/16/2021   CBC    Component Value Date/Time   WBC 8.0 10/16/2021 0828   RBC 4.68 10/16/2021 0828   HGB 13.1 10/16/2021 0828   HCT 40.1 10/16/2021 0828   PLT 285 10/16/2021 0828   MCV 86 10/16/2021 0828   MCH 28.0 10/16/2021 0828   MCHC 32.7 10/16/2021 0828   RDW 12.9 10/16/2021 0828   Iron Studies No results found for: IRON, TIBC, FERRITIN, IRONPCTSAT Lipid Panel     Component Value Date/Time   CHOL 233 (H) 03/18/2023 1336   TRIG 92 03/18/2023 1336   HDL 69 03/18/2023 1336   CHOLHDL 3.4 03/18/2023 1336   LDLCALC 148 (H) 03/18/2023 1336   Hepatic Function Panel     Component Value Date/Time   PROT 7.1 03/18/2023 1336   ALBUMIN 4.2 03/18/2023 1336   AST 12 03/18/2023 1336   ALT 11 03/18/2023 1336   ALKPHOS 102 03/18/2023 1336   BILITOT 0.3 03/18/2023 1336      Component Value Date/Time   TSH 3.070 10/16/2021 0828   Nutritional Lab Results  Component Value Date   VD25OH 47.7 03/18/2023   VD25OH 59.4 11/06/2022   VD25OH 46.6 08/16/2022    Attestations:   I, Special Puri, acting as a Stage manager for [employer], DO., have compiled all relevant documentation for today's office visit on behalf of Mackenzie Jenkins, DO, while in the presence of [employer], DO.  I have reviewed the above documentation for accuracy and completeness, and I agree with the above. Mackenzie JINNY Newman, D.O.  The 21st Century Cures Act was signed into law in 2016 which includes the topic of electronic health records.  This provides immediate access to information in MyChart. This includes consultation notes, operative notes, office notes, lab results and pathology reports.  If you have any questions about what you read please let us  know at your next visit so we can discuss your concerns and take corrective action if need be.  We are right here with you.

## 2023-09-11 LAB — COMPREHENSIVE METABOLIC PANEL WITH GFR
ALT: 7 IU/L (ref 0–32)
AST: 12 IU/L (ref 0–40)
Albumin: 4 g/dL (ref 3.8–4.9)
Alkaline Phosphatase: 88 IU/L (ref 44–121)
BUN/Creatinine Ratio: 18 (ref 9–23)
BUN: 19 mg/dL (ref 6–24)
Bilirubin Total: 0.4 mg/dL (ref 0.0–1.2)
CO2: 20 mmol/L (ref 20–29)
Calcium: 9.7 mg/dL (ref 8.7–10.2)
Chloride: 105 mmol/L (ref 96–106)
Creatinine, Ser: 1.08 mg/dL — ABNORMAL HIGH (ref 0.57–1.00)
Globulin, Total: 2.5 g/dL (ref 1.5–4.5)
Glucose: 86 mg/dL (ref 70–99)
Potassium: 4.3 mmol/L (ref 3.5–5.2)
Sodium: 139 mmol/L (ref 134–144)
Total Protein: 6.5 g/dL (ref 6.0–8.5)
eGFR: 60 mL/min/1.73 (ref >59)

## 2023-09-11 LAB — TSH: TSH: 2.49 u[IU]/mL (ref 0.450–4.500)

## 2023-09-11 LAB — LIPID PANEL
Chol/HDL Ratio: 2.7 ratio (ref 0.0–4.4)
Cholesterol, Total: 176 mg/dL (ref 100–199)
HDL: 66 mg/dL (ref >39)
LDL Chol Calc (NIH): 94 mg/dL (ref 0–99)
Triglycerides: 86 mg/dL (ref 0–149)
VLDL Cholesterol Cal: 16 mg/dL (ref 5–40)

## 2023-09-11 LAB — VITAMIN B12: Vitamin B-12: 457 pg/mL (ref 232–1245)

## 2023-09-11 LAB — VITAMIN D 25 HYDROXY (VIT D DEFICIENCY, FRACTURES): Vit D, 25-Hydroxy: 37.9 ng/mL (ref 30.0–100.0)

## 2023-09-11 LAB — MAGNESIUM: Magnesium: 2.3 mg/dL (ref 1.6–2.3)

## 2023-09-11 LAB — HEMOGLOBIN A1C
Est. average glucose Bld gHb Est-mCnc: 100 mg/dL
Hgb A1c MFr Bld: 5.1 % (ref 4.8–5.6)

## 2023-09-11 LAB — INSULIN, RANDOM: INSULIN: 9.7 u[IU]/mL (ref 2.6–24.9)

## 2023-10-06 NOTE — Progress Notes (Unsigned)
 SUBJECTIVE: Discussed the use of AI scribe software for clinical note transcription with the patient, who gave verbal consent to proceed.  Chief Complaint: Obesity  Interim History: She is down 3 lbs since her last visit Down 49 lbs overall TBW Loss of 19.7%  Mackenzie Newman is here to discuss her progress with her obesity treatment plan. She is on the Category 2 Plan and states she is following her eating plan approximately 60 % of the time. She states she is exercising weight training/walking 10 K steps daily/ 30 minutes 3-4 times per week.  Discussed the use of AI scribe software for clinical note transcription with the patient, who gave verbal consent to proceed.  History of Present Illness   Mackenzie Newman is a 58 year old female who presents for follow-up of her obesity treatment plan.  She has successfully lost 49 pounds and increased her muscle mass. Her adipose percentage has decreased from 50.1% to 41.7%. She is currently on Wegovy , which effectively suppresses her hunger and reduces cravings. She manages cravings by assessing their nature and distracting herself if they are emotional or stress-related.  She has a history of prediabetes with insulin  resistance and is on metformin , which she tolerates well without gastrointestinal side effects. Her blood pressure is well-controlled on a low dose of losartan  hydrochlorothiazide , which was recently decreased. She also takes bupropion  without any issues and is on vitamin D  supplementation every other week.  She has obstructive sleep apnea and uses CPAP therapy. She occasionally uses Ambien , about twice a month, for sleep difficulties. No major issues with sleep currently.  She engages in weight training exercises and plans to increase the weight in the coming month. She is mindful of her nutrition and prioritizes protein intake. No issues with metformin  or Wegovy , no nausea, vomiting, diarrhea, constipation, difficulty swallowing, lump in  throat, mood changes, or vision changes.      OBJECTIVE: Visit Diagnoses: Problem List Items Addressed This Visit     Morbid obesity with starting BMI 44   Relevant Medications   Semaglutide -Weight Management (WEGOVY ) 1 MG/0.5ML SOAJ   metFORMIN  (GLUCOPHAGE ) 500 MG tablet   Vitamin D  deficiency   Essential hypertension - Primary   OSA on CPAP   Pre-diabetes   Relevant Medications   metFORMIN  (GLUCOPHAGE ) 500 MG tablet   Depression   Relevant Medications   buPROPion  (WELLBUTRIN  XL) 150 MG 24 hr tablet   Other Visit Diagnoses       BMI 33.0-33.9,adult Current BMI 33.78       Relevant Medications   Semaglutide -Weight Management (WEGOVY ) 1 MG/0.5ML SOAJ      Obesity with emotional eating and cravings Obesity with significant weight loss progress, down 49 pounds with improved muscle mass and reduced adipose percentage. Emotional eating and cravings are managed effectively with self-awareness and behavioral strategies. Current treatment with Wegovy  is effective without significant side effects. - Refill Wegovy   1mg  weekly prescription Refill bupropion  150 mg daily - Continue current behavioral strategies for managing cravings - Encourage continued weight training to maintain muscle mass  Prediabetes with insulin  resistance Prediabetes with insulin  resistance is managed with metformin . Weight loss and medication are contributing to improved metabolic control without reported side effects. A1c up to 6.1 in 2023 Lab Results  Component Value Date   HGBA1C 5.1 09/09/2023   HGBA1C 5.4 03/18/2023   HGBA1C 5.4 11/06/2022   Lab Results  Component Value Date   LDLCALC 94 09/09/2023   CREATININE 1.08 (H) 09/09/2023   INSULIN   Date Value Ref Range Status  09/09/2023 9.7 2.6 - 24.9 uIU/mL Final  ] - Refill metformin  500 mg daily prescription Continue working on nutrition plan to decrease simple carbohydrates, increase lean proteins and exercise to promote weight loss, improve  glycemic control and prevent progression to Type 2 diabetes.   Meds ordered this encounter  Medications   Semaglutide -Weight Management (WEGOVY ) 1 MG/0.5ML SOAJ    Sig: Inject 1 mg into the skin once a week.    Dispense:  2 mL    Refill:  0   buPROPion  (WELLBUTRIN  XL) 150 MG 24 hr tablet    Sig: Take 1 tablet (150 mg total) by mouth daily.    Dispense:  90 tablet    Refill:  0   metFORMIN  (GLUCOPHAGE ) 500 MG tablet    Sig: Take 1 tablet (500 mg total) by mouth daily.    Dispense:  90 tablet    Refill:  0    Hypertension Hypertension is well-controlled on a low dose of losartan  hydrochlorothiazide . Blood pressure readings are excellent. Medication dosage has been decreased due to weight loss, with potential for further reduction with continued weight loss. - Monitor blood pressure regularly - Consider further reduction in antihypertensive medication with continued weight loss if appropriate Continue to work on nutrition plan to promote weight loss and improve BP control.    Obstructive sleep apnea on CPAP Obstructive sleep apnea is managed with CPAP. Sleep difficulties have improved with occasional use of Ambien  for insomnia, approximately twice a month. - Continue CPAP therapy - Use Ambien  as needed for insomnia Intensive lifestyle modifications are the first line treatment for this issue. We discussed several lifestyle modifications today and she will continue to work on diet, exercise and weight loss efforts. We will continue to monitor.    Vitamin D  deficiency Vitamin D  deficiency is managed with supplementation. She is taking vitamin D  Ergocalciferol  50,000 units every other week. Last vitamin D  Lab Results  Component Value Date   VD25OH 37.9 09/09/2023      Vitals Temp: 98.1 F (36.7 C) BP: 109/74 Pulse Rate: 71 SpO2: 99 %   Anthropometric Measurements Height: 5' 5 (1.651 m) Weight: 200 lb (90.7 kg) BMI (Calculated): 33.28 Weight at Last Visit: 203  lb Weight Lost Since Last Visit: 3 lb Weight Gained Since Last Visit: 0 Starting Weight: 249 lb Total Weight Loss (lbs): 49 lb (22.2 kg) Peak Weight: 249 lb   Body Composition  Body Fat %: 41.7 % Fat Mass (lbs): 83.6 lbs Muscle Mass (lbs): 110.8 lbs Total Body Water (lbs): 75.6 lbs Visceral Fat Rating : 11   Other Clinical Data Fasting: yes Labs: no Today's Visit #: 25 Starting Date: 10/17/22     ASSESSMENT AND PLAN:  Diet: Mackenzie Newman is currently in the action stage of change. As such, her goal is to continue with weight loss efforts. She has agreed to Category 2 Plan.  Exercise: Mackenzie Newman has been instructed to work up to a goal of 150 minutes of combined cardio and strengthening exercise per week for weight loss and overall health benefits.   Behavior Modification:  We discussed the following Behavioral Modification Strategies today: increasing lean protein intake, decreasing simple carbohydrates, increasing vegetables, increase H2O intake, increase high fiber foods, no skipping meals, meal planning and cooking strategies, avoiding temptations, and planning for success. We discussed various medication options to help Mackenzie Newman with her weight loss efforts and we both agreed to continue current treatment plan, continue to work on nutritional  and behavioral strategies to promote weight loss.  .  Return in about 4 weeks (around 11/04/2023).Mackenzie Newman She was informed of the importance of frequent follow up visits to maximize her success with intensive lifestyle modifications for her multiple health conditions.  Attestation Statements:   Reviewed by clinician on day of visit: allergies, medications, problem list, medical history, surgical history, family history, social history, and previous encounter notes.   Time spent on visit including pre-visit chart review and post-visit care and charting was 32 minutes.    Mackenzie Scholle, PA-C

## 2023-10-07 ENCOUNTER — Ambulatory Visit (INDEPENDENT_AMBULATORY_CARE_PROVIDER_SITE_OTHER): Admitting: Physician Assistant

## 2023-10-07 ENCOUNTER — Encounter (INDEPENDENT_AMBULATORY_CARE_PROVIDER_SITE_OTHER): Payer: Self-pay | Admitting: Physician Assistant

## 2023-10-07 VITALS — BP 109/74 | HR 71 | Temp 98.1°F | Ht 65.0 in | Wt 200.0 lb

## 2023-10-07 DIAGNOSIS — E559 Vitamin D deficiency, unspecified: Secondary | ICD-10-CM | POA: Diagnosis not present

## 2023-10-07 DIAGNOSIS — F5089 Other specified eating disorder: Secondary | ICD-10-CM

## 2023-10-07 DIAGNOSIS — F32A Depression, unspecified: Secondary | ICD-10-CM

## 2023-10-07 DIAGNOSIS — F3289 Other specified depressive episodes: Secondary | ICD-10-CM

## 2023-10-07 DIAGNOSIS — R7303 Prediabetes: Secondary | ICD-10-CM | POA: Diagnosis not present

## 2023-10-07 DIAGNOSIS — I10 Essential (primary) hypertension: Secondary | ICD-10-CM | POA: Diagnosis not present

## 2023-10-07 DIAGNOSIS — G4733 Obstructive sleep apnea (adult) (pediatric): Secondary | ICD-10-CM

## 2023-10-07 DIAGNOSIS — E538 Deficiency of other specified B group vitamins: Secondary | ICD-10-CM

## 2023-10-07 DIAGNOSIS — Z6833 Body mass index (BMI) 33.0-33.9, adult: Secondary | ICD-10-CM

## 2023-10-07 MED ORDER — METFORMIN HCL 500 MG PO TABS: 500.0000 mg | ORAL_TABLET | Freq: Every day | ORAL | 0 refills | Status: DC

## 2023-10-07 MED ORDER — BUPROPION HCL ER (XL) 150 MG PO TB24: 150.0000 mg | ORAL_TABLET | Freq: Every day | ORAL | 0 refills | Status: DC

## 2023-10-07 MED ORDER — WEGOVY 1 MG/0.5ML ~~LOC~~ SOAJ: 1.0000 mg | SUBCUTANEOUS | 0 refills | Status: DC

## 2023-11-05 ENCOUNTER — Ambulatory Visit (INDEPENDENT_AMBULATORY_CARE_PROVIDER_SITE_OTHER): Admitting: Family Medicine

## 2023-11-19 ENCOUNTER — Ambulatory Visit (INDEPENDENT_AMBULATORY_CARE_PROVIDER_SITE_OTHER): Admitting: Family Medicine

## 2023-11-19 ENCOUNTER — Encounter (INDEPENDENT_AMBULATORY_CARE_PROVIDER_SITE_OTHER): Payer: Self-pay | Admitting: Family Medicine

## 2023-11-19 VITALS — BP 134/77 | HR 63 | Temp 98.1°F | Ht 65.0 in | Wt 202.0 lb

## 2023-11-19 DIAGNOSIS — F5089 Other specified eating disorder: Secondary | ICD-10-CM

## 2023-11-19 DIAGNOSIS — E559 Vitamin D deficiency, unspecified: Secondary | ICD-10-CM | POA: Diagnosis not present

## 2023-11-19 DIAGNOSIS — I10 Essential (primary) hypertension: Secondary | ICD-10-CM | POA: Diagnosis not present

## 2023-11-19 DIAGNOSIS — F3289 Other specified depressive episodes: Secondary | ICD-10-CM

## 2023-11-19 DIAGNOSIS — E669 Obesity, unspecified: Secondary | ICD-10-CM

## 2023-11-19 DIAGNOSIS — R7303 Prediabetes: Secondary | ICD-10-CM | POA: Diagnosis not present

## 2023-11-19 DIAGNOSIS — G4733 Obstructive sleep apnea (adult) (pediatric): Secondary | ICD-10-CM

## 2023-11-19 DIAGNOSIS — Z6833 Body mass index (BMI) 33.0-33.9, adult: Secondary | ICD-10-CM

## 2023-11-19 DIAGNOSIS — E65 Localized adiposity: Secondary | ICD-10-CM

## 2023-11-19 MED ORDER — VITAMIN D (ERGOCALCIFEROL) 1.25 MG (50000 UNIT) PO CAPS: 50000.0000 [IU] | ORAL_CAPSULE | ORAL | 0 refills | Status: DC

## 2023-11-19 MED ORDER — METFORMIN HCL 500 MG PO TABS: 500.0000 mg | ORAL_TABLET | Freq: Every day | ORAL | 0 refills | Status: DC

## 2023-11-19 MED ORDER — BUPROPION HCL ER (XL) 150 MG PO TB24: 150.0000 mg | ORAL_TABLET | Freq: Every day | ORAL | 0 refills | Status: DC

## 2023-11-19 MED ORDER — WEGOVY 1 MG/0.5ML ~~LOC~~ SOAJ: 1.0000 mg | SUBCUTANEOUS | 2 refills | Status: DC

## 2023-11-19 NOTE — Progress Notes (Signed)
 Mackenzie Newman, D.O.  ABFM, ABOM Specializing in Clinical Bariatric Medicine  Office located at: [address] [place], [place]  [address]   Assessment and Plan:   Medications Discontinued During This Encounter  Medication Reason   Vitamin D , Ergocalciferol , (DRISDOL ) 1.25 MG (50000 UNIT) CAPS capsule Reorder   Semaglutide -Weight Management (WEGOVY ) 1 MG/0.5ML SOAJ Reorder   buPROPion  (WELLBUTRIN  XL) 150 MG 24 hr tablet Reorder   metFORMIN  (GLUCOPHAGE ) 500 MG tablet Reorder     Meds ordered this encounter  Medications   buPROPion  (WELLBUTRIN  XL) 150 MG 24 hr tablet    Sig: Take 1 tablet (150 mg total) by mouth daily.    Dispense:  90 tablet    Refill:  0   metFORMIN  (GLUCOPHAGE ) 500 MG tablet    Sig: Take 1 tablet (500 mg total) by mouth daily.    Dispense:  90 tablet    Refill:  0   semaglutide -weight management (WEGOVY ) 1 MG/0.5ML SOAJ SQ injection    Sig: Inject 1 mg into the skin once a week.    Dispense:  2 mL    Refill:  2   Vitamin D , Ergocalciferol , (DRISDOL ) 1.25 MG (50000 UNIT) CAPS capsule    Sig: Take 1 capsule (50,000 Units total) by mouth every 14 (fourteen) days.    Dispense:  6 capsule    Refill:  0      FOR THE DISEASE OF OBESITY:  BMI 33.0-33.9,adult Current BMI 33.61 Obesity- START BMI 44 Assessment & Plan: Since last office visit on 10/07/23 patient's muscle mass has decreased by 3.2 lbs. Fat mass has increased by 5 lbs. Total body water has increased by 4.4 lbs.  Body fat % has increased by 2.2 %. Counseling done on how various foods will affect these numbers and how to maximize success  Total lbs lost to date: - 47 lbs Total weight loss percentage to date: - 18.88 %  - Tracking Calories/Macros: no - not asked to   - Eating More Whole Foods: yes  - Adequate Protein Intake: no   - Adequate Water Intake: yes  - Skipping Meals: no   - Sleeping 7-9 Hours/ Night: yes   Recommended Dietary Goals [name] is currently in the action  stage of change. As such, her goal is to continue weight management plan.  She has agreed to: Cat 2 MP    Behavioral Intervention We discussed the following today: increasing lean protein intake to established goals, eating smaller meals, increase strength training (resistance bands)   Additional resources provided today: Handout on [employer]   Evidence-based interventions for health behavior change were utilized today including the discussion of self monitoring techniques, problem-solving barriers and SMART goal setting techniques.   Regarding patient's less desirable eating habits and patterns, we employed the technique of small changes.   Goal(s) for next OV: Break protein intake into smaller portions throughout the day.      Recommended Physical Activity Goals [name] has been advised to work up to 300-450 minutes of moderate intensity aerobic activity a week and strengthening exercises 2-3 times per week for cardiovascular health, weight loss maintenance and preservation of muscle mass.   She has agreed to: Think about enjoyable ways to increase daily physical activity and overcoming barriers to exercise, Increase physical activity in their day and reduce sedentary time (increase NEAT)., Increase volume of physical activity to a goal of 240 minutes a week, and Combine aerobic and strengthening exercises for efficiency and  improved cardiometabolic health.   Pharmacotherapy We both agreed to: Continue with current nutritional and behavioral strategies and medications (metformin  and wegovy )  On Wegovy  1 mg. Was previously on 1.7 mg but was decreased to the 1 mg 08/12/23 due to pt stating that her appetite was overly suppressed and not being able to eat. New dose is working well states that she is not skipping meals but sometimes it is hard to get all protein in. Will refill today.   ASSOCIATED CONDITIONS ADDRESSED TODAY:  Essential hypertension Assessment & Plan BP  Readings from Last 3 Encounters:  11/19/23 134/77  10/07/23 109/74  09/09/23 123/79   The 10-year ASCVD risk score (Arnett DK, et al., 2019) is: 2.4%  Lab Results  Component Value Date   CREATININE 1.08 (H) 09/09/2023   On losartan -hydrochlorothiazide  1 tablet daily. Bp looks good today. Pt endorses checking BP at home sporadically but not consistently. Reminded pt that shedding fat will help control BP. Continue taking and following MP.      Pre-diabetes Assessment & Plan Lab Results  Component Value Date   HGBA1C 5.1 09/09/2023   HGBA1C 5.4 03/18/2023   HGBA1C 5.4 11/06/2022   INSULIN  9.7 09/09/2023   INSULIN  38.2 (H) 03/18/2023   INSULIN  10.0 11/06/2022    On Metformin  500 mg daily. Pt endorses that hunger and cravings are well controlled.No side effects or concerns at this moment. Will refill today. Continue medication.    OSA on CPAP Assessment & Plan Pt states that she is still using her CPAP machine with good compliance. Recommended that she inquire about a new sleepy study, last one in 2021 to keep everything up to day. Continue using CPAP to help with reduction in fatigue.      Visceral obesity Assessment & Plan Reviewed with pt her visceral obesity. Not at goal, currently at 12. Educated that visceral obesity increased the adipose tissue around the abdominal organs and how that increases inflammation and disrupts metabolic processes, Goal is to be <12 Continue eating according to meal plan and decreasing processed foods and increasing exercise.     Vitamin D  deficiency Assessment & Plan Lab Results  Component Value Date   VD25OH 37.9 09/09/2023   VD25OH 47.7 03/18/2023   VD25OH 59.4 11/06/2022   Taking ERGO 50 K units every other week. Good compliance and tolerance. Will refill today. Continue taking.     Emotional eating/cravings Assessment & Plan On Wellbutrin  XL 150 mg daily. Cravings are well controlled. Recommended that she break her meals and  proteins into smaller portions and eat throughout the day every 2-3 hours to help. Will refill Wellbutrin  today. Continue taking medication will reassess at next OV.     Follow up:   Return 02/17/24 at 7:40 AM  She was informed of the importance of frequent follow up visits to maximize her success with intensive lifestyle modifications for her multiple health conditions.    Subjective:    Chief complaint: Obesity Syrai is here to discuss her progress with her obesity treatment plan. She is on the Category 2 Plan and states she is following her eating plan approximately 60% of the time. Pt is walking  45 minutes 3-4 days per week    Interval History:  Jerricka Carvey is here for a follow up office visit. Pt has experienced a weight gain of 2 lbs since last OV on 10/07/23. Pt states that everything is going well. 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 Pt expresses she feels like she has good eating habits and behavioral traits to help her expand her time between visits      Pharmacotherapy that aid with weight loss: She is currently taking Wegovy  1 mg weekly, Metformin  500 mg daily and Wellbutrin  XL 150 mg daily.    Review of Systems:  Pertinent positives were addressed with patient today.  Reviewed by clinician on day of visit: allergies, medications, problem list, medical history, surgical history, family history, social history, and previous encounter notes.  Weight Summary and Biometrics   Weight Lost Since Last Visit: 0lb  Weight Gained Since Last Visit: 2lb    Vitals Temp: 98.1 F (36.7 C) BP: 134/77 Pulse Rate: 63 SpO2: 99 %   Anthropometric Measurements Height: 5' 5 (1.651 m) Weight: 202 lb (91.6 kg) BMI (Calculated): 33.61 Weight at Last Visit: 200 lb Weight Lost Since Last Visit: 0lb Weight Gained Since Last Visit: 2lb Starting Weight: 249 lb Total Weight Loss (lbs): 47 lb (21.3 kg) Peak Weight: 249 lb   Body  Composition  Body Fat %: 43.9 % Fat Mass (lbs): 88.6 lbs Muscle Mass (lbs): 107.6 lbs Total Body Water (lbs): 80 lbs Visceral Fat Rating : 12   Other Clinical Data Fasting: yes Labs: no Today's Visit #: 26 Starting Date: 10/17/23    Objective:   PHYSICAL EXAM: Blood pressure 134/77, pulse 63, temperature 98.1 F (36.7 C), height 5' 5 (1.651 m), weight 202 lb (91.6 kg), SpO2 99%. Body mass index is 33.61 kg/m.  General: she is overweight, cooperative and in no acute distress. PSYCH: Has normal mood, affect and thought process.   HEENT: EOMI, sclerae are anicteric. Lungs: Normal breathing effort, no conversational dyspnea. Extremities: Moves * 4 Neurologic: A and O * 3, good insight  DIAGNOSTIC DATA REVIEWED: BMET    Component Value Date/Time   NA 139 09/09/2023 0811   K 4.3 09/09/2023 0811   CL 105 09/09/2023 0811   CO2 20 09/09/2023 0811   GLUCOSE 86 09/09/2023 0811   BUN 19 09/09/2023 0811   CREATININE 1.08 (H) 09/09/2023 0811   CALCIUM 9.7 09/09/2023 0811   GFRNONAA 54 (L) 01/06/2020 0759   GFRAA 63 01/06/2020 0759   Lab Results  Component Value Date   HGBA1C 5.1 09/09/2023   HGBA1C 5.6 06/23/2019   Lab Results  Component Value Date   INSULIN  9.7 09/09/2023   INSULIN  25.2 (H) 10/16/2021   Lab Results  Component Value Date   TSH 2.490 09/09/2023   CBC    Component Value Date/Time   WBC 8.0 10/16/2021 0828   RBC 4.68 10/16/2021 0828   HGB 13.1 10/16/2021 0828   HCT 40.1 10/16/2021 0828   PLT 285 10/16/2021 0828   MCV 86 10/16/2021 0828   MCH 28.0 10/16/2021 0828   MCHC 32.7 10/16/2021 0828   RDW 12.9 10/16/2021 0828   Iron Studies No results found for: IRON, TIBC, FERRITIN, IRONPCTSAT Lipid Panel     Component Value Date/Time   CHOL 176 09/09/2023 0811   TRIG 86 09/09/2023 0811   HDL 66 09/09/2023 0811   CHOLHDL 2.7 09/09/2023 0811   LDLCALC 94 09/09/2023 0811   Hepatic Function Panel     Component Value Date/Time   PROT  6.5 09/09/2023 0811   ALBUMIN 4.0 09/09/2023 0811   AST 12 09/09/2023 0811   ALT 7 09/09/2023 0811   ALKPHOS 88 09/09/2023 0811   BILITOT 0.4 09/09/2023 0811      Component Value Date/Time  TSH 2.490 09/09/2023 0811   Nutritional Lab Results  Component Value Date   VD25OH 37.9 09/09/2023   VD25OH 47.7 03/18/2023   VD25OH 59.4 11/06/2022    Attestations:   I, Sonny Laroche, acting as a medical scribe for Mackenzie Jenkins, DO., have compiled all relevant documentation for today's office visit on behalf of Mackenzie Jenkins, DO, while in the presence of [employer], DO.   I have reviewed the above documentation for accuracy and completeness, and I agree with the above. Mackenzie JINNY Newman, D.O.  The 21st Century Cures Act was signed into law in 2016 which includes the topic of electronic health records.  This provides immediate access to information in MyChart.  This includes consultation notes, operative notes, office notes, lab results and pathology reports.  If you have any questions about what you read please let us  know at your next visit so we can discuss your concerns and take corrective action if need be.  We are right here with you.

## 2023-12-02 ENCOUNTER — Ambulatory Visit (INDEPENDENT_AMBULATORY_CARE_PROVIDER_SITE_OTHER): Admitting: Physician Assistant

## 2023-12-27 ENCOUNTER — Other Ambulatory Visit: Payer: Self-pay | Admitting: Family Medicine

## 2023-12-28 NOTE — Telephone Encounter (Signed)
 Requested Prescriptions  Pending Prescriptions Disp Refills   losartan -hydrochlorothiazide  (HYZAAR) 50-12.5 MG tablet [Pharmacy Med Name: LOSARTAN /HCTZ 50/12.5MG  TABLETS] 90 tablet 0    Sig: TAKE 1 TABLET BY MOUTH DAILY     Cardiovascular: ARB + Diuretic Combos Failed - 12/28/2023 10:21 PM      Failed - Cr in normal range and within 180 days    Creatinine, Ser  Date Value Ref Range Status  09/09/2023 1.08 (H) 0.57 - 1.00 mg/dL Final         Passed - K in normal range and within 180 days    Potassium  Date Value Ref Range Status  09/09/2023 4.3 3.5 - 5.2 mmol/L Final         Passed - Na in normal range and within 180 days    Sodium  Date Value Ref Range Status  09/09/2023 139 134 - 144 mmol/L Final         Passed - eGFR is 10 or above and within 180 days    GFR calc Af Amer  Date Value Ref Range Status  01/06/2020 63 >59 mL/min/1.73 Final    Comment:    **In accordance with recommendations from the NKF-ASN Task force,**   [facility] is in the process of updating its eGFR calculation to the   2021 CKD-EPI creatinine equation that estimates kidney function   without a race variable.    GFR calc non Af Amer  Date Value Ref Range Status  01/06/2020 54 (L) >59 mL/min/1.73 Final   eGFR  Date Value Ref Range Status  09/09/2023 60 >59 mL/min/1.73 Final         Passed - Patient is not pregnant      Passed - Last BP in normal range    BP Readings from Last 1 Encounters:  11/19/23 134/77         Passed - Valid encounter within last 6 months    Recent Outpatient Visits           5 months ago Encounter for annual physical exam   [facility] [place], Jon HERO, MD

## 2024-01-06 ENCOUNTER — Ambulatory Visit: Admitting: Family Medicine

## 2024-01-06 VITALS — BP 109/52 | HR 80 | Ht 65.0 in | Wt 198.0 lb

## 2024-01-06 DIAGNOSIS — N182 Chronic kidney disease, stage 2 (mild): Secondary | ICD-10-CM

## 2024-01-06 DIAGNOSIS — G4733 Obstructive sleep apnea (adult) (pediatric): Secondary | ICD-10-CM

## 2024-01-06 DIAGNOSIS — I10 Essential (primary) hypertension: Secondary | ICD-10-CM

## 2024-01-06 DIAGNOSIS — E66811 Obesity, class 1: Secondary | ICD-10-CM

## 2024-01-06 DIAGNOSIS — R7303 Prediabetes: Secondary | ICD-10-CM

## 2024-01-06 DIAGNOSIS — Z23 Encounter for immunization: Secondary | ICD-10-CM | POA: Diagnosis not present

## 2024-01-06 DIAGNOSIS — F5101 Primary insomnia: Secondary | ICD-10-CM

## 2024-01-06 DIAGNOSIS — E7800 Pure hypercholesterolemia, unspecified: Secondary | ICD-10-CM

## 2024-01-06 DIAGNOSIS — R040 Epistaxis: Secondary | ICD-10-CM | POA: Diagnosis not present

## 2024-01-06 DIAGNOSIS — K219 Gastro-esophageal reflux disease without esophagitis: Secondary | ICD-10-CM

## 2024-01-06 DIAGNOSIS — Z7984 Long term (current) use of oral hypoglycemic drugs: Secondary | ICD-10-CM

## 2024-01-06 DIAGNOSIS — F3342 Major depressive disorder, recurrent, in full remission: Secondary | ICD-10-CM

## 2024-01-06 MED ORDER — TRAZODONE HCL 50 MG PO TABS: 25.0000 mg | ORAL_TABLET | Freq: Every evening | ORAL | 3 refills | Status: AC | PRN

## 2024-01-06 MED ORDER — ZOLPIDEM TARTRATE 10 MG PO TABS: 10.0000 mg | ORAL_TABLET | Freq: Every evening | ORAL | 1 refills | Status: AC | PRN

## 2024-01-06 NOTE — Assessment & Plan Note (Signed)
 She uses a humidified mask, which helps with dryness but does not prevent epistaxis. - Continue CPAP therapy with humidified mask.

## 2024-01-06 NOTE — Assessment & Plan Note (Signed)
 Managed with trazodone  and Ambien  as needed. She reports not sleeping through the night but is able to return to sleep easily. Ambien  is used infrequently. - Refilled trazodone  prescription. - Refilled Ambien  prescription as needed.

## 2024-01-06 NOTE — Assessment & Plan Note (Signed)
 Managed with Wellbutrin  XL 150 mg daily. Well controlled. - Continue Wellbutrin  XL 150 mg daily.

## 2024-01-06 NOTE — Assessment & Plan Note (Signed)
 Chronic kidney disease has improved from stage 3A to stage 2, indicating mild kidney disease. GFR has improved to the 60s, which is a positive outcome. - Continue monitoring kidney function with regular labs.

## 2024-01-06 NOTE — Assessment & Plan Note (Signed)
 Blood pressure has improved significantly with weight loss and lifestyle changes. She has been able to discontinue antihypertensive medication and maintain normal blood pressure levels. - Continue current lifestyle modifications to maintain blood pressure control.

## 2024-01-06 NOTE — Progress Notes (Signed)
 Established patient visit   [name]   DOB: [birth year]   58 y.o. Female  MRN: [id number] Visit Date: 01/06/2024  Today's healthcare provider: Jon Eva, MD   Chief Complaint  Patient presents with   Medical Management of Chronic Issues   Hypertension    Stopped medication a few months ago due to light headedness. Reports she has continued to monitor at home with readings around 114/76. She does not smoke and has no symptoms to report   Pre-Diabetes   Subjective    Hypertension   HPI     Hypertension    Additional comments: Stopped medication a few months ago due to light headedness. Reports she has continued to monitor at home with readings around 114/76. She does not smoke and has no symptoms to report      Last edited by Lilian Fitzpatrick, CMA on 01/06/2024  8:40 AM.       Discussed the use of AI scribe software for clinical note transcription with the patient, who gave verbal consent to proceed.  History of Present Illness   Mackenzie Newman is a 58 year old female with stage 3A CKD, hypertension, and insulin  resistance who presents for chronic disease follow-up.  She has achieved significant weight loss, transitioning from morbid obesity to obesity, and is currently on Wegovy  1 mg weekly. She initially increased the dose to 1.7 mg but experienced a loss of appetite and reduced it back to 1 mg, which she finds effective. Her weight loss has improved her physical activity and stamina.  She has stage 3A CKD and is on losartan -HCTZ 50-12.5 mg daily for hypertension. She experiences intermittent epistaxis from her left nostril, occurring randomly and sometimes taking up to 20 minutes to stop. Episodes occur as infrequently as once every month or two and can happen at night while using her humidified CPAP mask.  She is on Wellbutrin  XL 150 mg daily for depression, metformin  500 mg daily for insulin  resistance, and omeprazole  20 mg daily for GERD. She  experienced an episode of GERD after consuming buttery garlic bread and red sauce, but overall, her symptoms have improved with weight loss and dietary changes.  She uses Ambien  10 mg QHS PRN for insomnia and trazodone , which she shares with her husband, to help with sleep. She does not sleep through the night but is able to return to sleep easily. Her dog often joins her in bed when she wakes up at night.         Medications: Outpatient Medications Prior to Visit  Medication Sig   benzonatate  (TESSALON ) 200 MG capsule Take 1 capsule (200 mg total) by mouth 3 (three) times daily as needed for cough.   buPROPion  (WELLBUTRIN  XL) 150 MG 24 hr tablet Take 1 tablet (150 mg total) by mouth daily.   fexofenadine  (ALLEGRA ) 180 MG tablet Take 1 tablet (180 mg total) by mouth daily.   guaiFENesin -codeine  100-10 MG/5ML syrup Take 10 mLs by mouth 3 (three) times daily as needed for cough.   metFORMIN  (GLUCOPHAGE ) 500 MG tablet Take 1 tablet (500 mg total) by mouth daily.   omeprazole  (PRILOSEC) 20 MG capsule TAKE 1 CAPSULE(20 MG) BY MOUTH DAILY   semaglutide -weight management (WEGOVY ) 1 MG/0.5ML SOAJ SQ injection Inject 1 mg into the skin once a week.   Vitamin D , Ergocalciferol , (DRISDOL ) 1.25 MG (50000 UNIT) CAPS capsule Take 1 capsule (50,000 Units total) by mouth every 14 (fourteen) days.   [DISCONTINUED] losartan -hydrochlorothiazide  (HYZAAR) 50-12.5 MG  tablet TAKE 1 TABLET BY MOUTH DAILY   [DISCONTINUED] traZODone  (DESYREL ) 50 MG tablet Take 0.5-1 tablets (25-50 mg total) by mouth at bedtime as needed for sleep. TAKE 1/2 TO 1 TABLET(25 TO 50 MG) BY MOUTH AT BEDTIME AS NEEDED FOR SLEEP   [DISCONTINUED] zolpidem  (AMBIEN ) 10 MG tablet Take 1 tablet (10 mg total) by mouth at bedtime as needed. for sleep   No facility-administered medications prior to visit.    Review of Systems     Objective    BP (!) 109/52 (BP Location: Right Arm, Patient Position: Sitting, Cuff Size: Large)   Pulse 80   Ht  5' 5 (1.651 m)   Wt 198 lb (89.8 kg)   BMI 32.95 kg/m    Physical Exam Vitals reviewed.  Constitutional:      General: She is not in acute distress.    Appearance: Normal appearance. She is well-developed. She is not diaphoretic.  HENT:     Head: Normocephalic and atraumatic.  Eyes:     General: No scleral icterus.    Conjunctiva/sclera: Conjunctivae normal.  Neck:     Thyroid: No thyromegaly.  Cardiovascular:     Rate and Rhythm: Normal rate and regular rhythm.     Heart sounds: Normal heart sounds. No murmur heard. Pulmonary:     Effort: Pulmonary effort is normal. No respiratory distress.     Breath sounds: Normal breath sounds. No wheezing, rhonchi or rales.  Musculoskeletal:     Cervical back: Neck supple.     Right lower leg: No edema.     Left lower leg: No edema.  Lymphadenopathy:     Cervical: No cervical adenopathy.  Skin:    General: Skin is warm and dry.     Findings: No rash.  Neurological:     Mental Status: She is alert and oriented to person, place, and time. Mental status is at baseline.  Psychiatric:        Mood and Affect: Mood normal.        Behavior: Behavior normal.      No results found for any visits on 01/06/24.  Assessment & Plan     Problem List Items Addressed This Visit       Cardiovascular and Mediastinum   Essential hypertension - Primary   Blood pressure has improved significantly with weight loss and lifestyle changes. She has been able to discontinue antihypertensive medication and maintain normal blood pressure levels. - Continue current lifestyle modifications to maintain blood pressure control.        Respiratory   OSA on CPAP   She uses a humidified mask, which helps with dryness but does not prevent epistaxis. - Continue CPAP therapy with humidified mask.        Digestive   Acid reflux   GERD symptoms have improved with weight loss and dietary changes. Occasional episodes occur, likely related to dietary  choices. - Continue current dietary modifications to manage GERD symptoms.         Genitourinary   Chronic kidney disease (CKD), stage 2   Chronic kidney disease has improved from stage 3A to stage 2, indicating mild kidney disease. GFR has improved to the 60s, which is a positive outcome. - Continue monitoring kidney function with regular labs.        Other   Insomnia   Managed with trazodone  and Ambien  as needed. She reports not sleeping through the night but is able to return to sleep easily. Ambien  is used infrequently. -  Refilled trazodone  prescription. - Refilled Ambien  prescription as needed.      Relevant Medications   traZODone  (DESYREL ) 50 MG tablet   Hyperlipidemia   Cholesterol levels have improved significantly with weight loss and lifestyle changes. No statin therapy is currently being used. - Continue current lifestyle modifications to maintain cholesterol levels.      Pre-diabetes   History of prediabetes, under monitoring Prediabetes is under monitoring. Recent labs show normal A1c levels, indicating improvement. - Continue monitoring A1c levels with regular labs.      Other Visit Diagnoses       Immunization due       Relevant Orders   Flu vaccine trivalent PF, 6mos and older(Flulaval,Afluria,Fluarix,Fluzone) (Completed)     Bleeding from the nose       Relevant Orders   Ambulatory referral to ENT     Obesity (BMI 30.0-34.9)               Obesity, class 1 Significant weight loss has been achieved, transitioning from morbid obesity to class 1 obesity. Weight loss has positively impacted overall health, including blood pressure and cholesterol levels. - Continue current weight management plan and lifestyle modifications.  Epistaxis, recurrent left nostril Recurrent epistaxis from the left nostril, occurring randomly and sometimes difficult to stop. Likely due to posterior vessel bleeding. No immediate concern for serious underlying condition. -  Referred to ENT for evaluation and possible cauterization of bleeding vessels.      Return in about 6 months (around 07/05/2024) for CPE.       Jon Eva, MD  [facility] [phone] (phone) [phone] (fax)  [facility]

## 2024-01-06 NOTE — Assessment & Plan Note (Signed)
 Cholesterol levels have improved significantly with weight loss and lifestyle changes. No statin therapy is currently being used. - Continue current lifestyle modifications to maintain cholesterol levels.

## 2024-01-06 NOTE — Assessment & Plan Note (Signed)
 History of prediabetes, under monitoring Prediabetes is under monitoring. Recent labs show normal A1c levels, indicating improvement. - Continue monitoring A1c levels with regular labs.

## 2024-01-06 NOTE — Assessment & Plan Note (Signed)
 GERD symptoms have improved with weight loss and dietary changes. Occasional episodes occur, likely related to dietary choices. - Continue current dietary modifications to manage GERD symptoms.

## 2024-02-17 ENCOUNTER — Ambulatory Visit (INDEPENDENT_AMBULATORY_CARE_PROVIDER_SITE_OTHER): Payer: Self-pay | Admitting: Family Medicine

## 2024-02-17 ENCOUNTER — Encounter (INDEPENDENT_AMBULATORY_CARE_PROVIDER_SITE_OTHER): Payer: Self-pay | Admitting: Family Medicine

## 2024-02-17 VITALS — BP 116/70 | HR 65 | Temp 97.9°F | Ht 65.0 in | Wt 194.0 lb

## 2024-02-17 DIAGNOSIS — R7303 Prediabetes: Secondary | ICD-10-CM | POA: Diagnosis not present

## 2024-02-17 DIAGNOSIS — Z6832 Body mass index (BMI) 32.0-32.9, adult: Secondary | ICD-10-CM

## 2024-02-17 DIAGNOSIS — E669 Obesity, unspecified: Secondary | ICD-10-CM

## 2024-02-17 DIAGNOSIS — I10 Essential (primary) hypertension: Secondary | ICD-10-CM

## 2024-02-17 DIAGNOSIS — E559 Vitamin D deficiency, unspecified: Secondary | ICD-10-CM

## 2024-02-17 DIAGNOSIS — Z6833 Body mass index (BMI) 33.0-33.9, adult: Secondary | ICD-10-CM

## 2024-02-17 DIAGNOSIS — F5089 Other specified eating disorder: Secondary | ICD-10-CM

## 2024-02-17 DIAGNOSIS — F3289 Other specified depressive episodes: Secondary | ICD-10-CM

## 2024-02-17 MED ORDER — BUPROPION HCL ER (XL) 150 MG PO TB24: 150.0000 mg | ORAL_TABLET | Freq: Every day | ORAL | 1 refills | Status: AC

## 2024-02-17 MED ORDER — WEGOVY 1 MG/0.5ML ~~LOC~~ SOAJ: 1.0000 mg | SUBCUTANEOUS | 3 refills | Status: AC

## 2024-02-17 MED ORDER — VITAMIN D (ERGOCALCIFEROL) 1.25 MG (50000 UNIT) PO CAPS: 50000.0000 [IU] | ORAL_CAPSULE | ORAL | 1 refills | Status: AC

## 2024-02-17 MED ORDER — METFORMIN HCL 500 MG PO TABS: 500.0000 mg | ORAL_TABLET | Freq: Every day | ORAL | 1 refills | Status: AC

## 2024-02-17 NOTE — Progress Notes (Signed)
 Mackenzie Newman, D.O.  ABFM, ABOM Specializing in Clinical Bariatric Medicine  Office located at: [address] [place], [place]  [address]      A) FOR THE CHRONIC DISEASE OF OBESITY:  Obesity- START BMI 44 BMI 33.0-33.9,adult Current BMI 32.28 Chief complaint: Obesity Mackenzie Newman is here to discuss her progress with her obesity treatment plan.   History of present illness / Interval history:  Mackenzie Newman is here today for her follow-up office visit.  Since last OV on 11/19/2023, pt is down 8 lbs. Pt reports she is doing well on the meal plan and is adhering to her health goals.   11/19/23 08:00 02/17/24 07:00   Body Fat % 43.9 % 41.3 %  Muscle Mass (lbs) 107.6 lbs 108 lbs  Fat Mass (lbs) 88.6 lbs 80.2 lbs  Total Body Water (lbs) 80 lbs 75 lbs  Visceral Fat Rating  12 11  Counseling done on how various foods will affect these numbers and how to maximize success   Total lbs lost to date: -55 lbs Total Fat Mass in lbs lost to date: -44.6 Total weight loss percentage to date: -22.09 %   Nutrition Therapy She is on the Category 2 Plan and states she is following her eating plan approximately 60 % of the time.   - Tracking Calories/Macros: no -    - Eating More Whole Foods: yes  - Adequate Protein Intake: yes  - Adequate Water Intake: yes  - Skipping Meals: no -    - Sleeping 7-9 Hours/ Night: yes   [name] is currently in the action stage of change. As such, her goal is to continue weight management plan.  She has agreed to: continue current plan   Physical Activity Pt is walking 60  minutes 4 days per week   [name] has been advised to work up to 300-450 minutes of moderate intensity aerobic activity a week and strengthening exercises 2-3 times per week for cardiovascular health, weight loss maintenance and preservation of muscle mass.  She has agreed to : Increase the intensity, frequency or duration of strengthening exercises , Discussed changing exercise  routine to avoid adaptation plateau or training plateau, and Combine aerobic and strengthening exercises for efficiency and improved cardiometabolic health.   Behavioral Modifications Evidence-based interventions for health behavior change were utilized today including the discussion of  1) self monitoring techniques:  none 2) problem-solving barriers:  none 3) self care:  exercise 4) SMART goals for next OV:  Implement weight-lifting exercises. Regarding patient's less desirable eating habits and patterns, we employed the technique of small changes.   We discussed the following today: decreasing simple carbohydrates , increasing water intake , and continue to work on implementation of reduced calorie nutritional plan Additional resources provided today: None   Medical Interventions/ Pharmacotherapy Previous Bariatric surgery: none Pharmacotherapy for weight loss: She is currently taking Wellbutrin  XL 150 mg daily, Metformin  500 mg daily, and Wegovy  1 mg once weekly  for medical weight loss.   Refill Wegovy  today.   We discussed various medication options to help Mackenzie Newman with her weight loss efforts and we both agreed to : Continue with current nutritional and behavioral strategies   B) OBESITY RELATED CONDITIONS ADDRESSED TODAY:  Pre-diabetes Assessment & Plan Lab Results  Component Value Date   HGBA1C 5.1 09/09/2023   HGBA1C 5.4 03/18/2023   HGBA1C 5.4 11/06/2022   INSULIN  9.7 09/09/2023   INSULIN  38.2 (H) 03/18/2023   INSULIN  10.0 11/06/2022  Currently  on Metformin  500 mg once daily with good compliance and tolerance. Hunger and cravings are well controlled. No acute concerns today. Cont prudent nutritional plan. Cont adherence to medication(refill today)    HTN, goal below 130/80- diet controlled Assessment & Plan BP Readings from Last 3 Encounters:  02/17/24 116/70  01/06/24 (!) 109/52  11/19/23 134/77   The 10-year ASCVD risk score (Arnett DK, et al., 2019) is: 1.5%   Lab Results  Component Value Date   CREATININE 1.08 (H) 09/09/2023  Currently managed by dietary and lifestyle interventions. Was previously on losartan -hydrochlorothiazide , but discontinued medication a couple months ago. BP is well controlled today. No acute concerns. Cont low-sodium, heart-healthy diet. Cont adequate water intake and exercise.      Vitamin D  deficiency Assessment & Plan Lab Results  Component Value Date   VD25OH 37.9 09/09/2023   VD25OH 47.7 03/18/2023   VD25OH 59.4 11/06/2022  Currently on Ergo 50K units once every 14 days with good compliance and tolerance. Vit D levels are below goal at 37.9; optimal between 50-70. No acute concerns today. Cont regimen(refill today). Will recheck levels as necessary.     Emotional eating/cravings Assessment & Plan Currently on Wellbutrin  XL 150 mg once daily with good compliance and tolerance. Mood is well controlled today. Reports emotional hunger has decreased. No acute concerns today. Cont medication(refill today). Cont implementing stress management strategies such as adequate exercise and sleep.      Medications Discontinued During This Encounter  Medication Reason   buPROPion  (WELLBUTRIN  XL) 150 MG 24 hr tablet Reorder   metFORMIN  (GLUCOPHAGE ) 500 MG tablet Reorder   semaglutide -weight management (WEGOVY ) 1 MG/0.5ML SOAJ SQ injection Reorder   Vitamin D , Ergocalciferol , (DRISDOL ) 1.25 MG (50000 UNIT) CAPS capsule Reorder     Meds ordered this encounter  Medications   buPROPion  (WELLBUTRIN  XL) 150 MG 24 hr tablet    Sig: Take 1 tablet (150 mg total) by mouth daily.    Dispense:  90 tablet    Refill:  1   metFORMIN  (GLUCOPHAGE ) 500 MG tablet    Sig: Take 1 tablet (500 mg total) by mouth daily.    Dispense:  90 tablet    Refill:  1   semaglutide -weight management (WEGOVY ) 1 MG/0.5ML SOAJ SQ injection    Sig: Inject 1 mg into the skin once a week.    Dispense:  2 mL    Refill:  3   Vitamin D , Ergocalciferol ,  (DRISDOL ) 1.25 MG (50000 UNIT) CAPS capsule    Sig: Take 1 capsule (50,000 Units total) by mouth every 14 (fourteen) days.    Dispense:  6 capsule    Refill:  1      Follow up:   Return 06/15/2024 7:40 AM.  She was informed of the importance of frequent follow up visits to maximize her success with intensive lifestyle modifications for her multiple health conditions.   Weight Summary and Biometrics   Weight Lost Since Last Visit: 8 lb  Weight Gained Since Last Visit: 0    Vitals Temp: 97.9 F (36.6 C) BP: 116/70 Pulse Rate: 65 SpO2: 99 %   Anthropometric Measurements Height: 5' 5 (1.651 m) Weight: 194 lb (88 kg) BMI (Calculated): 32.28 Weight at Last Visit: 202 lb Weight Lost Since Last Visit: 8 lb Weight Gained Since Last Visit: 0 Starting Weight: 249 lb Total Weight Loss (lbs): 55 lb (24.9 kg)   Body Composition  Body Fat %: 41.3 % Fat Mass (lbs): 80.2 lbs Muscle Mass (lbs): 108  lbs Total Body Water (lbs): 75 lbs Visceral Fat Rating : 11   Other Clinical Data Fasting: yes Labs: no Today's Visit #: 27 Starting Date: 10/16/21    Objective:   PHYSICAL EXAM: Blood pressure 116/70, pulse 65, temperature 97.9 F (36.6 C), height 5' 5 (1.651 m), weight 194 lb (88 kg), SpO2 99%. Body mass index is 32.28 kg/m.  General: she is overweight, cooperative and in no acute distress. PSYCH: Has normal mood, affect and thought process.   HEENT: EOMI, sclerae are anicteric. Lungs: Normal breathing effort, no conversational dyspnea. Extremities: Moves * 4 Neurologic: A and O * 3, good insight  DIAGNOSTIC DATA REVIEWED: BMET    Component Value Date/Time   NA 139 09/09/2023 0811   K 4.3 09/09/2023 0811   CL 105 09/09/2023 0811   CO2 20 09/09/2023 0811   GLUCOSE 86 09/09/2023 0811   BUN 19 09/09/2023 0811   CREATININE 1.08 (H) 09/09/2023 0811   CALCIUM 9.7 09/09/2023 0811   GFRNONAA 54 (L) 01/06/2020 0759   GFRAA 63 01/06/2020 0759   Lab Results   Component Value Date   HGBA1C 5.1 09/09/2023   HGBA1C 5.6 06/23/2019   Lab Results  Component Value Date   INSULIN  9.7 09/09/2023   INSULIN  25.2 (H) 10/16/2021   Lab Results  Component Value Date   TSH 2.490 09/09/2023   CBC    Component Value Date/Time   WBC 8.0 10/16/2021 0828   RBC 4.68 10/16/2021 0828   HGB 13.1 10/16/2021 0828   HCT 40.1 10/16/2021 0828   PLT 285 10/16/2021 0828   MCV 86 10/16/2021 0828   MCH 28.0 10/16/2021 0828   MCHC 32.7 10/16/2021 0828   RDW 12.9 10/16/2021 0828   Iron Studies No results found for: IRON, TIBC, FERRITIN, IRONPCTSAT Lipid Panel     Component Value Date/Time   CHOL 176 09/09/2023 0811   TRIG 86 09/09/2023 0811   HDL 66 09/09/2023 0811   CHOLHDL 2.7 09/09/2023 0811   LDLCALC 94 09/09/2023 0811   Hepatic Function Panel     Component Value Date/Time   PROT 6.5 09/09/2023 0811   ALBUMIN 4.0 09/09/2023 0811   AST 12 09/09/2023 0811   ALT 7 09/09/2023 0811   ALKPHOS 88 09/09/2023 0811   BILITOT 0.4 09/09/2023 0811      Component Value Date/Time   TSH 2.490 09/09/2023 0811   Nutritional Lab Results  Component Value Date   VD25OH 37.9 09/09/2023   VD25OH 47.7 03/18/2023   VD25OH 59.4 11/06/2022    Attestations:   I, Feliciano Mingle, acting as a stage manager for [employer], DO., have compiled all relevant documentation for today's office visit on behalf of Mackenzie Jenkins, DO, while in the presence of [employer], DO.   I have reviewed the above documentation for accuracy and completeness, and I agree with the above. Mackenzie JINNY Newman, D.O.  The 21st Century Cures Act was signed into law in 2016 which includes the topic of electronic health records.  This provides immediate access to information in MyChart.  This includes consultation notes, operative notes, office notes, lab results and pathology reports.  If you have any questions about what you read please let us  know at your next visit so we can  discuss your concerns and take corrective action if need be.  We are right here with you.

## 2024-03-24 ENCOUNTER — Telehealth (INDEPENDENT_AMBULATORY_CARE_PROVIDER_SITE_OTHER): Payer: Self-pay | Admitting: *Deleted

## 2024-03-24 NOTE — Telephone Encounter (Signed)
 Message from plan: Request Reference Number: [id number]. WEGOVY  INJ 1MG  is approved through 03/24/2025. Your patient may now fill this prescription and it will be covered.. Authorization Expiration Date: March 24, 2025.  Patient notified via Mychart

## 2024-03-24 NOTE — Telephone Encounter (Signed)
 Jaki Rieger (Key: [id number])  Your information has been sent to [facility].

## 2024-03-26 ENCOUNTER — Other Ambulatory Visit: Payer: Self-pay | Admitting: Family Medicine

## 2024-06-15 ENCOUNTER — Ambulatory Visit (INDEPENDENT_AMBULATORY_CARE_PROVIDER_SITE_OTHER): Admitting: Family Medicine

## 2024-07-07 ENCOUNTER — Encounter: Admitting: Family Medicine
